# Patient Record
Sex: Female | Born: 1980 | Hispanic: Yes | Marital: Single | Smoking: Never smoker
Health system: Southern US, Community
[De-identification: ages and names within clinical notes are randomized; demographics above are authoritative.]

## PROBLEM LIST (undated history)

## (undated) ENCOUNTER — Inpatient Hospital Stay (HOSPITAL_COMMUNITY): Payer: Self-pay

## (undated) DIAGNOSIS — R87619 Unspecified abnormal cytological findings in specimens from cervix uteri: Secondary | ICD-10-CM

## (undated) DIAGNOSIS — R87629 Unspecified abnormal cytological findings in specimens from vagina: Secondary | ICD-10-CM

## (undated) DIAGNOSIS — I10 Essential (primary) hypertension: Secondary | ICD-10-CM

## (undated) DIAGNOSIS — J45909 Unspecified asthma, uncomplicated: Secondary | ICD-10-CM

## (undated) DIAGNOSIS — D649 Anemia, unspecified: Secondary | ICD-10-CM

## (undated) HISTORY — PX: NO PAST SURGERIES: SHX2092

## (undated) HISTORY — DX: Unspecified asthma, uncomplicated: J45.909

## (undated) HISTORY — DX: Unspecified abnormal cytological findings in specimens from vagina: R87.629

---
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
## 2004-09-30 ENCOUNTER — Encounter (INDEPENDENT_AMBULATORY_CARE_PROVIDER_SITE_OTHER): Payer: Self-pay | Admitting: *Deleted

## 2004-09-30 ENCOUNTER — Inpatient Hospital Stay (HOSPITAL_COMMUNITY): Admission: AD | Admit: 2004-09-30 | Discharge: 2004-10-02 | Payer: Self-pay | Admitting: Obstetrics

## 2009-02-01 ENCOUNTER — Ambulatory Visit (HOSPITAL_COMMUNITY): Admission: RE | Admit: 2009-02-01 | Discharge: 2009-02-01 | Payer: Self-pay | Admitting: Obstetrics & Gynecology

## 2009-02-24 ENCOUNTER — Inpatient Hospital Stay (HOSPITAL_COMMUNITY): Admission: AD | Admit: 2009-02-24 | Discharge: 2009-02-24 | Payer: Self-pay | Admitting: Obstetrics & Gynecology

## 2009-06-22 ENCOUNTER — Ambulatory Visit (HOSPITAL_COMMUNITY): Admission: RE | Admit: 2009-06-22 | Discharge: 2009-06-22 | Payer: Self-pay | Admitting: Obstetrics & Gynecology

## 2009-06-24 ENCOUNTER — Inpatient Hospital Stay (HOSPITAL_COMMUNITY): Admission: AD | Admit: 2009-06-24 | Discharge: 2009-06-25 | Payer: Self-pay | Admitting: Obstetrics & Gynecology

## 2009-06-24 ENCOUNTER — Ambulatory Visit: Payer: Self-pay | Admitting: Physician Assistant

## 2010-07-01 LAB — CBC
HCT: 37.6 % (ref 36.0–46.0)
Hemoglobin: 12.2 g/dL (ref 12.0–15.0)
MCHC: 32.4 g/dL (ref 30.0–36.0)
MCV: 83.6 fL (ref 78.0–100.0)
Platelets: 138 10*3/uL — ABNORMAL LOW (ref 150–400)
RBC: 4.49 MIL/uL (ref 3.87–5.11)
RDW: 15.2 % (ref 11.5–15.5)
WBC: 10 10*3/uL (ref 4.0–10.5)

## 2010-07-01 LAB — RPR: RPR Ser Ql: NONREACTIVE

## 2010-07-11 LAB — CBC
Hemoglobin: 11.6 g/dL — ABNORMAL LOW (ref 12.0–15.0)
MCHC: 32.9 g/dL (ref 30.0–36.0)
MCV: 87.4 fL (ref 78.0–100.0)
RBC: 4.02 MIL/uL (ref 3.87–5.11)
RDW: 14.1 % (ref 11.5–15.5)

## 2012-03-03 ENCOUNTER — Ambulatory Visit (INDEPENDENT_AMBULATORY_CARE_PROVIDER_SITE_OTHER): Payer: Self-pay | Admitting: Gynecology

## 2012-03-03 ENCOUNTER — Encounter: Payer: Self-pay | Admitting: Gynecology

## 2012-03-03 ENCOUNTER — Other Ambulatory Visit (HOSPITAL_COMMUNITY)
Admission: RE | Admit: 2012-03-03 | Discharge: 2012-03-03 | Disposition: A | Discharge status: Home / Self Care | Payer: Self-pay | Source: Ambulatory Visit | Attending: Gynecology | Admitting: Gynecology

## 2012-03-03 VITALS — BP 138/90 | Ht <= 58 in | Wt 125.0 lb

## 2012-03-03 DIAGNOSIS — N938 Other specified abnormal uterine and vaginal bleeding: Secondary | ICD-10-CM | POA: Insufficient documentation

## 2012-03-03 DIAGNOSIS — Z01419 Encounter for gynecological examination (general) (routine) without abnormal findings: Secondary | ICD-10-CM | POA: Insufficient documentation

## 2012-03-03 DIAGNOSIS — N949 Unspecified condition associated with female genital organs and menstrual cycle: Secondary | ICD-10-CM

## 2012-03-03 DIAGNOSIS — Z1151 Encounter for screening for human papillomavirus (HPV): Secondary | ICD-10-CM | POA: Insufficient documentation

## 2012-03-03 DIAGNOSIS — N915 Oligomenorrhea, unspecified: Secondary | ICD-10-CM | POA: Insufficient documentation

## 2012-03-03 DIAGNOSIS — R635 Abnormal weight gain: Secondary | ICD-10-CM

## 2012-03-03 MED ORDER — MEDROXYPROGESTERONE ACETATE 10 MG PO TABS: ORAL_TABLET | ORAL | Status: DC

## 2012-03-03 NOTE — Progress Notes (Signed)
Gabrielle Park [birth year] [id number]   History:    31 y.o.  for annual gyn exam patient to the practice. Patient stated for the past 3 months she has had irregular cycles whereby she would go a couple months without having menses and then bleed for 7-10 days. She had a ParaGard T380A IUD [facility] approximately 2 years ago. She's complained of some weight gain and occasional headaches. She stated her Pap smear was normal in 2012. She in frequently does her breast examination. Her last menstrual period was November 8. She is contemplating on getting pregnant next few months if her exam and lab work were normal. She denies any prior history of abnormal Pap smear.  Past medical history,surgical history, family history and social history were all reviewed and documented in the EPIC chart.  Gynecologic History Patient's last menstrual period was 02/14/2012. Contraception: IUD Last Pap: 2012. Results were: normal Last mammogram: Not indicated. Results were: Not indicated  Obstetric History OB History    Grav Para Term Preterm Abortions TAB SAB Ect Mult Living   3 3 3       3      # Outc Date GA Lbr Len/2nd Wgt Sex Del Anes PTL Lv   1 TRM     F SVD  No Yes   2 TRM     F SVD  No Yes   3 TRM     M SVD  No Yes       ROS: A ROS was performed and pertinent positives and negatives are included in the history.  GENERAL: No fevers or chills. HEENT: No change in vision, no earache, sore throat or sinus congestion. NECK: No pain or stiffness. CARDIOVASCULAR: No chest pain or pressure. No palpitations. PULMONARY: No shortness of breath, cough or wheeze. GASTROINTESTINAL: No abdominal pain, nausea, vomiting or diarrhea, melena or bright red blood per rectum. GENITOURINARY: No urinary frequency, urgency, hesitancy or dysuria. MUSCULOSKELETAL: No joint or muscle pain, no back pain, no recent trauma. DERMATOLOGIC: No rash, no itching, no lesions. ENDOCRINE: No polyuria, polydipsia, no  heat or cold intolerance. No recent change in weight. HEMATOLOGICAL: No anemia or easy bruising or bleeding. NEUROLOGIC: No headache, seizures, numbness, tingling or weakness. PSYCHIATRIC: No depression, no loss of interest in normal activity or change in sleep pattern.     Exam: chaperone present  BP 138/90  Ht 4\' 10"  (1.473 m)  Wt 125 lb (56.7 kg)  BMI 26.13 kg/m2  LMP 02/14/2012  Body mass index is 26.13 kg/(m^2).  General appearance : Well developed well nourished female. No acute distress HEENT: Neck supple, trachea midline, no carotid bruits, no thyroidmegaly Lungs: Clear to auscultation, no rhonchi or wheezes, or rib retractions  Heart: Regular rate and rhythm, no murmurs or gallops Breast:Examined in sitting and supine position were symmetrical in appearance, no palpable masses or tenderness,  no skin retraction, no nipple inversion, no nipple discharge, no skin discoloration, no axillary or supraclavicular lymphadenopathy Abdomen: no palpable masses or tenderness, no rebound or guarding Extremities: no edema or skin discoloration or tenderness  Pelvic:  Bartholin, Urethra, Skene Glands: Within normal limits             Vagina: No gross lesions or discharge  Cervix: No gross lesions or discharge, IUD string seen  Uterus  anteverted, normal size, shape and consistency, non-tender and mobile  Adnexa  Without masses or tenderness  Anus and perineum  normal   Rectovaginal  normal sphincter tone without  palpated masses or tenderness             Hemoccult not indicated     Assessment/Plan:  31 y.o. female for annual exam with secondary amenorrhea. Patient with ParaGard T380A IUD placed 2 years ago. We will obtain the following lab tests: TSH, prolactin, blood sugar, CBC urinalysis and Pap smear. She will be given a prescription of Provera 10 mg to take for 5-10 days if she does not have spontaneous menses every 30 days. She states that she may return to the office in a couple  months to have the IUD removed and since she is contemplating on getting pregnant. We discussed importance of monthly self breast examination. If she continues to have irregular bleeding despite the above recommendation we'll need to do an ultrasound. All the above were discussed in Spanish all questions answered.    Ok Edwards MD, 12:49 PM 03/03/2012

## 2012-03-03 NOTE — Patient Instructions (Signed)
Ejercicios para perder peso (Exercise to Lose Weight) La actividad fsica y una dieta saludable ayudan a perder peso. El mdico podr sugerirle ejercicios especficos. IDEAS Y CONSEJOS PARA HACER EJERCICIOS  Elija opciones econmicas que disfrute hacer , como caminar, andar en bicicleta o los vdeos para ejercitarse.   Utilice las escaleras en lugar del ascensor.   Camine durante la hora del almuerzo.   Estacione el auto lejos del lugar de trabajo o estudio.   Concurra a un gimnasio o tome clases de gimnasia.   Comience con 5  10 minutos de actividad fsica por da. Ejercite hasta 30 minutos, 4 a 6 das por semana.   Utilice zapatos que tengan un buen soporte y ropas cmodas.   Elongue antes y despus de ejercitar.   Ejercite hasta que aumente la respiracin y el corazn palpite rpido.   Beba agua extra cuando ejercite.   No haga ejercicio hasta lastimarse, sentirse mareado o que le falte mucho el aire.  La actividad fsica puede quemar alrededor de 150 caloras.  Correr 20 cuadras en 15 minutos.   Jugar vley durante 45 a 60 minutos.   Limpiar y encerar el auto durante 45 a 60 minutos.   Jugar ftbol americano de toque.   Caminar 25 cuadras en 35 minutos.   Empujar un cochecito 20 cuadras en 30 minutos.   Jugar baloncesto durante 30 minutos.   Rastrillar hojas secas durante 30 minutos.   Andar en bicicleta 80 cuadras en 30 minutos.   Caminar 30 cuadras en 30 minutos.   Bailar durante 30 minutos.   Quitar la nieve con una pala durante 15 minutos.   Nadar vigorosamente durante 20 minutos.   Subir escaleras durante 15 minutos.   Andar en bicicleta 60 cuadras durante 15 minutos.   Arreglar el jardn entre 30 y 45 minutos.   Saltar a la soga durante 15 minutos.   Limpiar vidrios o pisos durante 45 a 60 minutos.  Document Released: 06/29/2010 Document Revised: 12/05/2010 ExitCare Patient Information 2012 [employer]. 

## 2012-03-04 LAB — URINALYSIS W MICROSCOPIC + REFLEX CULTURE
Bilirubin Urine: NEGATIVE
Casts: NONE SEEN
Crystals: NONE SEEN
Glucose, UA: NEGATIVE mg/dL
Ketones, ur: NEGATIVE mg/dL
Specific Gravity, Urine: 1.02 (ref 1.005–1.030)
Squamous Epithelial / LPF: NONE SEEN
pH: 7.5 (ref 5.0–8.0)

## 2014-02-07 ENCOUNTER — Encounter: Payer: Self-pay | Admitting: Gynecology

## 2014-02-15 ENCOUNTER — Encounter (HOSPITAL_COMMUNITY): Payer: Self-pay

## 2014-02-15 ENCOUNTER — Emergency Department (HOSPITAL_COMMUNITY)
Admission: EM | Admit: 2014-02-15 | Discharge: 2014-02-16 | Disposition: A | Discharge status: Home / Self Care | Payer: Self-pay | Attending: Emergency Medicine | Admitting: Emergency Medicine

## 2014-02-15 ENCOUNTER — Emergency Department (HOSPITAL_COMMUNITY): Payer: Self-pay

## 2014-02-15 DIAGNOSIS — R079 Chest pain, unspecified: Secondary | ICD-10-CM

## 2014-02-15 DIAGNOSIS — R531 Weakness: Secondary | ICD-10-CM | POA: Insufficient documentation

## 2014-02-15 DIAGNOSIS — R1084 Generalized abdominal pain: Secondary | ICD-10-CM | POA: Insufficient documentation

## 2014-02-15 DIAGNOSIS — Z79899 Other long term (current) drug therapy: Secondary | ICD-10-CM | POA: Insufficient documentation

## 2014-02-15 DIAGNOSIS — R0789 Other chest pain: Secondary | ICD-10-CM | POA: Insufficient documentation

## 2014-02-15 DIAGNOSIS — R55 Syncope and collapse: Secondary | ICD-10-CM | POA: Insufficient documentation

## 2014-02-15 DIAGNOSIS — Z3202 Encounter for pregnancy test, result negative: Secondary | ICD-10-CM | POA: Insufficient documentation

## 2014-02-15 LAB — CBC
HEMATOCRIT: 34.1 % — AB (ref 36.0–46.0)
Hemoglobin: 10.7 g/dL — ABNORMAL LOW (ref 12.0–15.0)
MCH: 22.8 pg — ABNORMAL LOW (ref 26.0–34.0)
MCHC: 31.4 g/dL (ref 30.0–36.0)
MCV: 72.6 fL — AB (ref 78.0–100.0)
Platelets: 231 10*3/uL (ref 150–400)
RBC: 4.7 MIL/uL (ref 3.87–5.11)
RDW: 18.1 % — ABNORMAL HIGH (ref 11.5–15.5)
WBC: 8.6 10*3/uL (ref 4.0–10.5)

## 2014-02-15 LAB — I-STAT TROPONIN, ED: Troponin i, poc: 0 ng/mL (ref 0.00–0.08)

## 2014-02-15 LAB — URINALYSIS, ROUTINE W REFLEX MICROSCOPIC
Bilirubin Urine: NEGATIVE
Glucose, UA: NEGATIVE mg/dL
Hgb urine dipstick: NEGATIVE
KETONES UR: NEGATIVE mg/dL
LEUKOCYTES UA: NEGATIVE
NITRITE: NEGATIVE
PH: 5 (ref 5.0–8.0)
Protein, ur: NEGATIVE mg/dL
SPECIFIC GRAVITY, URINE: 1.021 (ref 1.005–1.030)
UROBILINOGEN UA: 0.2 mg/dL (ref 0.0–1.0)

## 2014-02-15 LAB — COMPREHENSIVE METABOLIC PANEL
ALBUMIN: 3.8 g/dL (ref 3.5–5.2)
ALT: 13 U/L (ref 0–35)
ANION GAP: 14 (ref 5–15)
AST: 19 U/L (ref 0–37)
Alkaline Phosphatase: 78 U/L (ref 39–117)
BUN: 11 mg/dL (ref 6–23)
CHLORIDE: 102 meq/L (ref 96–112)
CO2: 25 mEq/L (ref 19–32)
CREATININE: 0.67 mg/dL (ref 0.50–1.10)
Calcium: 9.4 mg/dL (ref 8.4–10.5)
GFR calc Af Amer: >90 mL/min (ref >90)
GFR calc non Af Amer: >90 mL/min (ref >90)
Glucose, Bld: 85 mg/dL (ref 70–99)
Potassium: 4.2 mEq/L (ref 3.7–5.3)
Sodium: 141 mEq/L (ref 137–147)
TOTAL PROTEIN: 7.6 g/dL (ref 6.0–8.3)

## 2014-02-15 LAB — POC URINE PREG, ED: PREG TEST UR: NEGATIVE

## 2014-02-15 LAB — D-DIMER, QUANTITATIVE (NOT AT ARMC): D DIMER QUANT: 0.42 ug{FEU}/mL (ref 0.00–0.48)

## 2014-02-15 NOTE — ED Notes (Signed)
Pt ambulated to bathroom. Steady gait

## 2014-02-15 NOTE — ED Notes (Signed)
Pt has returned from radiology.  

## 2014-02-15 NOTE — ED Provider Notes (Signed)
CSN: [id number]     Arrival date & time 02/15/14  1621 History   First MD Initiated Contact with Patient 02/15/14 2155     Chief Complaint  Patient presents with  . Chest Pain  . Loss of Consciousness  . Weakness  . Dysuria     (Consider location/radiation/quality/duration/timing/severity/associated sxs/prior Treatment) HPI Comments: Patient presents with complaint of chest pain that began yesterday and also pain in her lower abdomen. Pain is worse in both areas with deep breathing. Patient denies fever, cough, shortness of breath, nausea, vomiting, diarrhea, dysuria, hematuria, vaginal bleeding or discharge. Her last menstrual period was one month ago and was normal for her at that time. Patient denies risk factors for pulmonary embolism including: unilateral leg swelling, history of DVT/PE/other blood clots, use of estrogens, recent immobilizations, recent surgery, recent travel (>4hr segment), malignancy, hemoptysis.   Patient mentions to triage nurse that she had a syncopal episode prior to arrival. She did not mention this to me while I was talking to her using the interpreter phone.    Patient is a 33 y.o. female presenting with chest pain, syncope, weakness, and dysuria. The history is provided by the patient and medical records. The history is limited by a language barrier. A language interpreter was used.  Chest Pain Associated symptoms: abdominal pain, syncope and weakness   Associated symptoms: no cough, no fever, no headache, no nausea and not vomiting   Loss of Consciousness Associated symptoms: chest pain and weakness   Associated symptoms: no fever, no headaches, no nausea and no vomiting   Weakness Associated symptoms include abdominal pain, chest pain and weakness. Pertinent negatives include no coughing, fever, headaches, myalgias, nausea, rash, sore throat or vomiting.  Dysuria Associated symptoms: abdominal pain   Associated symptoms: no fever, no nausea and no  vomiting     History reviewed. No pertinent past medical history. History reviewed. No pertinent past surgical history. Family History  Problem Relation Age of Onset  . Diabetes Maternal Aunt    History  Substance Use Topics  . Smoking status: Never Smoker   . Smokeless tobacco: Never Used  . Alcohol Use: Yes     Comment: socially   OB History    Gravida Para Term Preterm AB TAB SAB Ectopic Multiple Living   3 3 3       3      Review of Systems  Constitutional: Negative for fever.  HENT: Negative for rhinorrhea and sore throat.   Eyes: Negative for redness.  Respiratory: Negative for cough.   Cardiovascular: Positive for chest pain and syncope.  Gastrointestinal: Positive for abdominal pain. Negative for nausea, vomiting and diarrhea.  Genitourinary: Negative for dysuria.  Musculoskeletal: Negative for myalgias.  Skin: Negative for rash.  Neurological: Positive for syncope and weakness. Negative for headaches.   Allergies  Review of patient's allergies indicates no known allergies.  Home Medications   Prior to Admission medications   Medication Sig Start Date End Date Taking? Authorizing Provider  medroxyPROGESTERone (PROVERA) 10 MG tablet Take one tablet daily for 5-10 days every 30 days if no spontaneous menses 03/03/12   Ok Edwards, MD   BP 155/93 mmHg  Pulse 71  Temp(Src) 98 F (36.7 C) (Oral)  Resp 18  SpO2 100%  LMP 01/30/2014   Physical Exam  Constitutional: She appears well-developed and well-nourished.  HENT:  Head: Normocephalic and atraumatic.  Mouth/Throat: Mucous membranes are normal. Mucous membranes are not dry.  Eyes: Conjunctivae are normal. Right eye  exhibits no discharge. Left eye exhibits no discharge.  Neck: Trachea normal and normal range of motion. Neck supple. Normal carotid pulses and no JVD present. No muscular tenderness present. Carotid bruit is not present. No tracheal deviation present.  Cardiovascular: Normal rate, regular  rhythm, S1 normal, S2 normal, normal heart sounds and intact distal pulses.  Exam reveals no decreased pulses.   No murmur heard. Pulmonary/Chest: Effort normal and breath sounds normal. No respiratory distress. She has no wheezes. She exhibits no tenderness.  Abdominal: Soft. Normal aorta and bowel sounds are normal. There is tenderness (mild) in the suprapubic area and left lower quadrant. There is no rebound and no guarding.  Musculoskeletal: Normal range of motion.  Neurological: She is alert.  Skin: Skin is warm and dry. She is not diaphoretic. No cyanosis. No pallor.  Psychiatric: She has a normal mood and affect.  Nursing note and vitals reviewed.   ED Course  Procedures (including critical care time) Labs Review Labs Reviewed  CBC - Abnormal; Notable for the following:    Hemoglobin 10.7 (*)    HCT 34.1 (*)    MCV 72.6 (*)    MCH 22.8 (*)    RDW 18.1 (*)    All other components within normal limits  COMPREHENSIVE METABOLIC PANEL - Abnormal; Notable for the following:    Total Bilirubin <0.2 (*)    All other components within normal limits  URINALYSIS, ROUTINE W REFLEX MICROSCOPIC  D-DIMER, QUANTITATIVE  I-STAT TROPOININ, ED  POC URINE PREG, ED    Imaging Review Dg Chest 2 View  02/15/2014   CLINICAL DATA:  Chest pain  EXAM: CHEST  2 VIEW  COMPARISON:  None.  FINDINGS: The heart size and mediastinal contours are within normal limits. Both lungs are clear. The visualized skeletal structures are unremarkable.  IMPRESSION: No active cardiopulmonary disease.   Electronically Signed   By: Natasha MeadLiviu  Pop M.D.   On: 02/15/2014 22:56     EKG Interpretation   Date/Time:  Tuesday February 15 2014 23:06:33 EST Ventricular Rate:  54 PR Interval:  167 QRS Duration: 96 QT Interval:  449 QTC Calculation: 425 R Axis:   80 Text Interpretation:  Sinus rhythm RSR' in V1 or V2, probably normal  variant Confirmed by WARD,  DO, KRISTEN ([id number]) on 02/15/2014 11:09:35 PM       10:18 PM  Patient seen and examined. Work-up initiated. Discussed with Dr. Elesa MassedWard. Given syncope -- she warrants d-dimer even though she is otherwise PERC neg. Low suspicion for PE.   Vital signs reviewed and are as follows: BP 155/93 mmHg  Pulse 71  Temp(Src) 98 F (36.7 C) (Oral)  Resp 18  SpO2 100%  LMP 01/30/2014  12:50 AM Patient's workup is negative. Patient informed via interpreter phone. She is anxious to leave. Will start on NSAIDs.   Patient discussed with Dr. Elesa MassedWard prior to discharge.   The patient was urged to return to the Emergency Department immediately with worsening of current symptoms, worsening abdominal pain, persistent vomiting, blood noted in stools, fever, or any other concerns. The patient verbalized understanding.    MDM   Final diagnoses:  Chest pain  Generalized abdominal pain   Patient with CP and mild lower abd pain. Regarding CP, CXR nml, EKG nml, d-dimer neg. Regarding abd pain, labs reassuring, UA clear. She is not having vaginal bleeding or discharge. Do not suspect torsion, TOA/PID. Doubt ruptured ovarian cyst. Do not feel pelvic exam compulsory at this point.   Syncope:  d-dimer neg, UPT neg, no orthostasis, no EKG findings concerning for arrhythmogenic etiology.   Will treat with NSAIDs, have patient f/u with PCP, return with worsening symptoms.   No dangerous or life-threatening conditions suspected or identified by history, physical exam, and by work-up. No indications for hospitalization identified.      Renne CriglerJoshua Sully Manzi, PA-C 02/16/14 [id number]  Ethelda ChickMartha K Linker, MD 02/19/14 [phone]

## 2014-02-15 NOTE — ED Notes (Signed)
Called time x 1 no answer.

## 2014-02-15 NOTE — ED Notes (Signed)
Pt sts she was sitting in the corner with her pager when called for room.  Discharge undone.

## 2014-02-15 NOTE — ED Notes (Signed)
Called no answer

## 2014-02-15 NOTE — ED Notes (Signed)
Pt from home with c/o chest pain, syncope and weakness s/p syncope.  Pt sts she was walking around and had a syncopal episode lasting 15 minutes.  Pt reporting left lower abdominal pain described as "burning".  Also reporting pain with inspiration. Sts she has been feeling weak after syncopal episode.

## 2014-02-15 NOTE — ED Notes (Signed)
Pt monitored by pulse ox, bp cuff, adn 12-lead.

## 2014-02-16 MED ORDER — NAPROXEN 500 MG PO TABS: 500.0000 mg | ORAL_TABLET | Freq: Two times a day (BID) | ORAL | Status: DC

## 2014-02-16 NOTE — Discharge Instructions (Signed)
Please read and follow all provided instructions.  Your diagnoses today include:  1. Generalized abdominal pain   2. Chest pain     Tests performed today include:  Blood counts and electrolytes - normal  Blood tests to check liver and kidney function - normal  Blood tests to check pancreas function - normal  Urine test to look for infection and pregnancy (in women) - no infection or pregnancy  Chest x-ray - no infection or other problems.   Vital signs. See below for your results today.   Medications prescribed:   Naproxen - anti-inflammatory pain medication  Do not exceed 500mg  naproxen every 12 hours, take with food  You have been prescribed an anti-inflammatory medication or NSAID. Take with food. Take smallest effective dose for the shortest duration needed for your pain. Stop taking if you experience stomach pain or vomiting.   Take any prescribed medications only as directed.  Home care instructions:   Follow any educational materials contained in this packet.  Follow-up instructions: Please follow-up with your primary care provider in the next 3 days for further evaluation of your symptoms.    Return instructions:  SEEK IMMEDIATE MEDICAL ATTENTION IF:  The pain does not go away or becomes severe   A temperature above 101F develops   Repeated vomiting occurs (multiple episodes)   The pain becomes localized to portions of the abdomen. The right side could possibly be appendicitis. In an adult, the left lower portion of the abdomen could be colitis or diverticulitis.   Blood is being passed in stools or vomit (bright red or black tarry stools)   You develop chest pain, difficulty breathing, dizziness or fainting, or become confused, poorly responsive, or inconsolable (young children)  If you have any other emergent concerns regarding your health  Additional Information: Abdominal (belly) pain can be caused by many things. Your caregiver performed an  examination and possibly ordered blood/urine tests and imaging (CT scan, x-rays, ultrasound). Many cases can be observed and treated at home after initial evaluation in the emergency department. Even though you are being discharged home, abdominal pain can be unpredictable. Therefore, you need a repeated exam if your pain does not resolve, returns, or worsens. Most patients with abdominal pain don't have to be admitted to the hospital or have surgery, but serious problems like appendicitis and gallbladder attacks can start out as nonspecific pain. Many abdominal conditions cannot be diagnosed in one visit, so follow-up evaluations are very important.  Your vital signs today were: BP 144/83 mmHg   Pulse 56   Temp(Src) 98 F (36.7 C) (Oral)   Resp 20   SpO2 100%   LMP 01/30/2014 If your blood pressure (bp) was elevated above 135/85 this visit, please have this repeated by your doctor within one month. --------------

## 2014-08-03 ENCOUNTER — Encounter (HOSPITAL_COMMUNITY): Payer: Self-pay | Admitting: *Deleted

## 2014-08-18 ENCOUNTER — Ambulatory Visit (HOSPITAL_COMMUNITY)
Admission: RE | Admit: 2014-08-18 | Discharge: 2014-08-18 | Disposition: A | Discharge status: Home / Self Care | Payer: Self-pay | Source: Ambulatory Visit | Attending: Obstetrics and Gynecology | Admitting: Obstetrics and Gynecology

## 2014-08-18 ENCOUNTER — Encounter (HOSPITAL_COMMUNITY): Payer: Self-pay

## 2014-08-18 VITALS — BP 110/64 | Temp 97.3°F | Ht 60.0 in | Wt 131.8 lb

## 2014-08-18 DIAGNOSIS — N6489 Other specified disorders of breast: Secondary | ICD-10-CM

## 2014-08-18 DIAGNOSIS — Q838 Other congenital malformations of breast: Secondary | ICD-10-CM

## 2014-08-18 DIAGNOSIS — R87612 Low grade squamous intraepithelial lesion on cytologic smear of cervix (LGSIL): Secondary | ICD-10-CM

## 2014-08-18 HISTORY — DX: Essential (primary) hypertension: I10

## 2014-08-18 NOTE — Patient Instructions (Signed)
Education materials on self-breast awareness given. Explained to Hormel Foodsraceli Park the colposcopy that is needed to follow-up for abnormal Pap smear. [facility] to follow-up for abnormal Pap smear. Appointment scheduled for Monday, Aug 22, 2014 at 1515. Patient aware of appointment and will be there. Screening mammogram recommended at age 34 unless clinically indicated prior. Aamani Park verbalized understanding.  Janelis Stelzer, Kathaleen Maserhristine Poll, RN 4:38 PM

## 2014-08-18 NOTE — Progress Notes (Signed)
[facility] due to recommending a colposocopy to follow-up for abnormal Pap smear 07/22/2014.  Pap Smear:  Pap smear not completed today. [facility] and LGS[facility]. [facility] for a colposcopy to follow-up for abnormal Pap smear. Appointment scheduled for Monday, Aug 22, 2014 at 1515. Per patient her most recent Pap smear is the only abnormal Pap smear she has had. Last Pap smear result is scanned into EPIC under media.  Physical exam: Breasts Breasts symmetrical. No skin abnormalities bilateral breasts. No nipple retraction bilateral breasts. No nipple discharge bilateral breasts. No lymphadenopathy. No lumps palpated bilateral breasts. No complaints of pain or tenderness on exam. Screening mammogram recommended at age 34 unless clinically indicated prior.    Pelvic/Bimanual No Pap smear completed today since last Pap smear was 07/22/2014. Pap smear not indicated per [facility] guidelines.   Used interpreter Nira ConnJulia Sowell.

## 2014-08-22 ENCOUNTER — Encounter: Payer: Self-pay | Admitting: Obstetrics & Gynecology

## 2014-08-22 ENCOUNTER — Ambulatory Visit (INDEPENDENT_AMBULATORY_CARE_PROVIDER_SITE_OTHER): Payer: Self-pay | Admitting: Obstetrics & Gynecology

## 2014-08-22 ENCOUNTER — Other Ambulatory Visit (HOSPITAL_COMMUNITY)
Admission: RE | Admit: 2014-08-22 | Discharge: 2014-08-22 | Disposition: A | Discharge status: Home / Self Care | Payer: Self-pay | Source: Ambulatory Visit | Attending: Obstetrics & Gynecology | Admitting: Obstetrics & Gynecology

## 2014-08-22 VITALS — BP 149/90 | HR 51 | Ht 58.5 in | Wt 128.6 lb

## 2014-08-22 DIAGNOSIS — R87612 Low grade squamous intraepithelial lesion on cytologic smear of cervix (LGSIL): Secondary | ICD-10-CM

## 2014-08-22 LAB — POCT PREGNANCY, URINE: PREG TEST UR: NEGATIVE

## 2014-08-22 NOTE — Progress Notes (Signed)
Patient ID: Gabrielle Park, female   DOB: 10/26/1980, 34 y.o.   MRN: [id number]  Chief Complaint  Patient presents with  . Colposcopy    HPI Gabrielle Park is a 34 y.o. female.  Patient's last menstrual period was 07/30/2014 (exact date). 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 Previous colposcopy: no and in n/a. Prior cervical treatment: no.  Past Medical History  Diagnosis Date  . Hypertension   . Vaginal Pap smear, abnormal     History reviewed. No pertinent past surgical history.  Family History  Problem Relation Age of Onset  . Diabetes Maternal Aunt   . Hypertension Mother     Social History History  Substance Use Topics  . Smoking status: Never Smoker   . Smokeless tobacco: Never Used  . Alcohol Use: No     Comment: socially    No Known Allergies  Current Outpatient Prescriptions  Medication Sig Dispense Refill  . medroxyPROGESTERone (PROVERA) 10 MG tablet Take one tablet daily for 5-10 days every 30 days if no spontaneous menses 30 tablet 4  . naproxen (NAPROSYN) 500 MG tablet Take 1 tablet (500 mg total) by mouth 2 (two) times daily. 30 tablet 0   No current facility-administered medications for this visit.    Review of Systems Review of Systems  Blood pressure 149/90, pulse 51, height 4' 10.5" (1.486 m), weight 128 lb 9.6 oz (58.333 kg), last menstrual period 07/30/2014.  Physical Exam Physical Exam  Data Reviewed Pap LSIL  Assessment    Procedure Details  The risks and benefits of the procedure and Written informed consent obtained. SCJ, TZ seen, minimal AWE, smal islands of WE, no abnl vessels Speculum placed in vagina and excellent visualization of cervix achieved, cervix swabbed x 3 with acetic acid solution. Monsels Specimens: ECC and Bx 12 and 6   Complications: none.     Plan    Specimens labelled and  sent to Pathology. Return to discuss Pathology results in 2 weeks.      Gabrielle Park 08/22/2014, 3:50 PM

## 2014-08-22 NOTE — Patient Instructions (Signed)
Colposcopa - Cuidados posteriores (Colposcopy, Care After) Siga estas instrucciones durante las prximas semanas. Estas indicaciones le proporcionan informacin general acerca de cmo deber cuidarse despus del procedimiento. El mdico tambin podr darle instrucciones ms especficas. El tratamiento se ha planificado de acuerdo a las prcticas mdicas actuales, pero a veces se producen problemas. Comunquese con el mdico si tiene algn problema o tiene dudas despus del procedimiento. QU ESPERAR DESPUS DEL PROCEDIMIENTO  Despus del procedimiento, es tpico tener las siguientes sensaciones:  Clicos. Generalmente se calman en algunos minutos.  Dolor. Puede durar hasta dos das.  Aturdimiento. Si esto le ocurre, recustese durante algunos minutos. Podr tener un sangrado leve o una secrecin oscura que debe detenerse en algunos das. Durante este tiempo deber usar un apsito sanitario. INSTRUCCIONES PARA EL CUIDADO EN EL HOGAR  Evite las relaciones sexuales, las duchas vaginales y el uso de tampones durante 3 das, o segn lo que le indique su mdico.  Tome slo medicamentos de venta libre o recetados, segn las indicaciones del mdico. No tome aspirina, ya que puede causar hemorragias.  Si utiliza pldoras anticonceptivas, contine tomndolas.  No todos los resultados estarn disponibles durante su visita. En este caso, tenga otra entrevista con su mdico para conocerlos. No suponga que es normal si no tiene noticias de su mdico o del establecimiento de salud. Es importante el seguimiento de todos los resultados de los estudios.  Siga los consejos de su mdico con respecto a los medicamentos, actividades, visitas y [place] de control. SOLICITE ATENCIN MDICA SI:  Aparece una erupcin cutnea.  Tiene problemas con los medicamentos. SOLICITE ATENCIN MDICA DE INMEDIATO SI:  Tiene una hemorragia abundante o elimina cogulos.  Tiene fiebre.  Tiene flujo vaginal  anormal.  Tiene clicos que no se alivian luego de tomar analgsicos.  Se siente mareada, tiene vahdos o se desmaya.  Siente dolor en el estmago. Document Released: 01/13/2013 ExitCare Patient Information 2015 [employer]. This information is not intended to replace advice given to you by your health care provider. Make sure you discuss any questions you have with your health care provider.  

## 2014-08-24 ENCOUNTER — Telehealth: Payer: Self-pay

## 2014-08-24 NOTE — Telephone Encounter (Signed)
Attempted to contact patient with spanish interpreter Gabrielle Park. No answer. Left message stating we are calling with results, please call clinic.

## 2014-08-24 NOTE — Telephone Encounter (Signed)
-----   Message from Adam PhenixJames G Arnold, MD sent at 08/24/2014  3:23 PM EDT ----- LSIL biopsy, needs f/u pap and cotesting 1 yr, may notify patient by phone with interpreter

## 2014-08-25 NOTE — Telephone Encounter (Signed)
Called pt with Spanish interpreter, Viviana SimplerAlis Park, and informed pt that she just needs to f/u in one year with pap with co-testing after having biopsy.  Pt stated understanding with no further questions.

## 2014-09-09 ENCOUNTER — Ambulatory Visit (INDEPENDENT_AMBULATORY_CARE_PROVIDER_SITE_OTHER): Payer: Self-pay | Admitting: Obstetrics & Gynecology

## 2014-09-09 ENCOUNTER — Encounter: Payer: Self-pay | Admitting: Obstetrics & Gynecology

## 2014-09-09 VITALS — BP 129/90 | HR 59 | Temp 98.2°F | Wt 125.5 lb

## 2014-09-09 DIAGNOSIS — R87612 Low grade squamous intraepithelial lesion on cytologic smear of cervix (LGSIL): Secondary | ICD-10-CM | POA: Insufficient documentation

## 2014-09-09 NOTE — Patient Instructions (Signed)
Informe de Papanicolau anormal  (Abnormal Pap Test Information) En el Papanicolaou se estudian las clulas de la superficie del cuello del tero para observar si son normales, anormales o si muestran signos de Building services engineeralteraciones debido a un tipo de virus llamado virus del Geneticist, molecularpapiloma humano o VPH. Se llama displasia a las clulas cervicales afectadas por el VPH. La displasia no es cncer, pero indica que hay clulas anormales que se encuentran en la superficie del cuello uterino. Segn el grado de displasia, algunas de las clulas puede ser consideradas pre-cancerosas y pueden convertirse en cncer con el tiempo, si se retrasa la atencin mdica.  QU SIGNIFICA QUE UN PAPANICOAU ES ANORMAL?  Tener una prueba de Papanicolaou anormal no significa que tiene cncer. Sin embargo, ciertos tipos de Papanicolaou anormales pueden ser signo de un riesgo ms alto de Geophysical data processordesarrollar cncer. El mdico le ordenar otras pruebas para investigar ms sobre las clulas anormales. Un resultado anormal de la prueba de Papanicolaou puede mostrar:   Cambios pequeos e inespecficos que deben controlarse cuidadosamente.   Displasia cervical que ha provocado cambios leves y que puede controlarse a travs del Reservetiempo.   Displasia cervical ms grave que debe seguirse y tratarse para estar seguros de que el problema desaparece.  Cncer.  Cuando se detecta una displasia cervical grave y se trata a tiempo, rara vez llega a ser cncer.  QU DEBO HACER LUEGO DE UN PAPANICOLAU ANORMAL?   Puede ser necesario realizar una colposcopia. Este es un procedimiento en el que se examina el cuello del tero con iluminacin y Set designermagnificacin.  Podrn extraerle Carlynn Heralduna pequea muestra de tejido del cuello uterino (biopsia) para ser [place]analizada. Esto generalmente se realiza si hay reas que parecen infectadas.  La muestra de clulas del canal cervical se toma con un cepillo pequeo o un instrumento de raspado (cureta). Segn los Norfolk Southernresultados de los  procedimientos anteriores, Oregonel mdico podr indicar un tratamiento con crioterapia en el cuello uterino o un LEEP quirrgico, en el que se extirpa una porcin del cuello uterino. LEEP son las siglas en ingls de "procedimiento de escisin electroquirrgica con asa". En raras ocasiones, el mdico podr indicar una biopsia en cono. Este es un procedimiento en el que se extrae una pequea muestra en forma de cono del cuello del tero. Se extrae en el rea en la que se encuentran las clulas.   QU DEBO HACER SI TENGO DISPLASIA O CNCER?  La derivarn a Music therapistun especialista. La radioterapia es un tratamiento para el cncer en los casos ms avanzados. La histerectoma es la ltima opcin de tratamiento para la displasia, pero es un tratamiento ms frecuente para Management consultantel cncer. El mdico comentar con usted todas las opciones de Hagarvilletratamiento.  QU DEBE HACER DESPUS DEL TRATAMIENTO?  Si su Papanicolaou no es normal, debe continuar con las pruebas y controles habituales segn las indicaciones de su mdico. Su problema cervical se vigilar cuidadosamente para que no empeore. Adems, el mdico controlar y tratar los nuevos problemas que puedan surgir.  Document Released: 06/17/2011 Document Revised: 07/20/2012 [facility] Patient Information 2015 [place], [place]. This information is not intended to replace advice given to you by your health care provider. Make sure you discuss any questions you have with your health care provider.

## 2014-09-09 NOTE — Progress Notes (Signed)
Subjective:     Patient ID: Gabrielle Park, female   DOB: 05/08/1980, 34 y.o.   MRN: [id number] Cc: pap and Bx result [id number] Patient's last menstrual period was 07/30/2014 (exact date). Here for result from colposcopy 5/16/ LSIL pap colpo note  Below reviewed  Expand All Collapse All   Patient ID: Gabrielle Park, female DOB: 04/21/1980, 34 y.o. MRN: [id number]  Chief Complaint  Patient presents with  . Colposcopy    HPI Gabrielle Park is a 34 y.o. female. Patient's last menstrual period was 07/30/2014 (exact date). [id number]  HPI  Indications: Pap smear on April 2016 showed: low-grade squamous intraepithelial neoplasia (LGSIL - encompassing HPV,mild dysplasia,CIN I). Previous colposcopy: no and in n/a. Prior cervical treatment: no.  Past Medical History  Diagnosis Date  . Hypertension   . Vaginal Pap smear, abnormal     History reviewed. No pertinent past surgical history.  Family History  Problem Relation Age of Onset  . Diabetes Maternal Aunt   . Hypertension Mother     Social History History  Substance Use Topics  . Smoking status: Never Smoker   . Smokeless tobacco: Never Used  . Alcohol Use: No     Comment: socially    No Known Allergies  Current Outpatient Prescriptions  Medication Sig Dispense Refill  . medroxyPROGESTERone (PROVERA) 10 MG tablet Take one tablet daily for 5-10 days every 30 days if no spontaneous menses 30 tablet 4  . naproxen (NAPROSYN) 500 MG tablet Take 1 tablet (500 mg total) by mouth 2 (two) times daily. 30 tablet 0   No current facility-administered medications for this visit.    Review of Systems Review of Systems  Blood pressure 149/90, pulse 51, height 4' 10.5" (1.486 m), weight 128 lb 9.6 oz (58.333 kg), last menstrual period 07/30/2014.  Physical Exam Physical Exam  Data Reviewed Pap LSIL  Assessment    Procedure Details   The risks and benefits of the procedure and Written informed consent obtained. SCJ, TZ seen, minimal AWE, smal islands of WE, no abnl vessels Speculum placed in vagina and excellent visualization of cervix achieved, cervix swabbed x 3 with acetic acid solution. Monsels Specimens: ECC and Bx 12 and 6   Complications: none.     Plan    Specimens labelled and sent to Pathology. Return to discuss Pathology results in 2 weeks.      Adolphus Hanf 08/22/2014, 3:50 PM      CIN 1 on Bx She asks to review her vital signs as she is on BP meds Rx by [facility]. Her BP med is not recorded and she cannot recall the name   Review of Systems  All other systems reviewed and are negative.      Objective:   Physical Exam  Constitutional: She appears well-developed. No distress.  Psychiatric: She has a normal mood and affect. Her behavior is normal.      Blood pressure 129/90, pulse 59, temperature 98.2 F (36.8 C), weight 125 lb 8 oz (56.926 kg), last menstrual period 07/30/2014.  Assessment:     LSILbx result Borderline BP on med, no change in mgmt     Plan:     F/u pap in 12 mo, may be done at HD    [facility]   Adam PhenixJames G Annick Dimaio, MD 09/09/2014

## 2014-09-09 NOTE — Progress Notes (Signed)
Here for results . Used interpreter Albertina SenegalMarly Adams

## 2014-09-20 ENCOUNTER — Encounter: Payer: Self-pay | Admitting: General Practice

## 2015-04-09 NOTE — L&D Delivery Note (Signed)
35 y.o. G4P3003 at 5167w5d delivered a viable female infant in cephalic, OA position. no nuchal cord. right anterior shoulder delivered with ease. 60 sec delayed cord clamping. Cord clamped x2 and cut. Placenta delivered spontaneously intact, with 3VC. Fundus firm on exam with massage and pitocin. Good hemostasis noted.  Laceration: superficial midline  Suture:  EBL: 100cc Good hemostasis noted.  Mom and baby recovering in LDR.    Apgars: 6,8 Weight: pending, skin to skin, see delivery summary  Cord blood obtained: yes   WALLACE, NOAH I, DO PGY-3 03/05/2016, 5:59 PM   OB FELLOW DELIVERY ATTESTATION  I was gloved and present for the delivery in its entirety, and I agree with the above resident's note.    Ernestina PennaNicholas Schenk, MD 6:08 PM

## 2015-04-24 ENCOUNTER — Other Ambulatory Visit: Payer: Self-pay | Admitting: Internal Medicine

## 2015-04-24 ENCOUNTER — Ambulatory Visit
Admission: RE | Admit: 2015-04-24 | Discharge: 2015-04-24 | Disposition: A | Discharge status: Home / Self Care | Payer: No Typology Code available for payment source | Source: Ambulatory Visit | Attending: Internal Medicine | Admitting: Internal Medicine

## 2015-04-24 DIAGNOSIS — R0602 Shortness of breath: Secondary | ICD-10-CM

## 2015-07-11 ENCOUNTER — Encounter (HOSPITAL_COMMUNITY): Payer: Self-pay | Admitting: *Deleted

## 2015-07-11 ENCOUNTER — Inpatient Hospital Stay (HOSPITAL_COMMUNITY)
Admission: AD | Admit: 2015-07-11 | Discharge: 2015-07-11 | Disposition: A | Discharge status: Home / Self Care | Payer: Self-pay | Source: Ambulatory Visit | Attending: Obstetrics & Gynecology | Admitting: Obstetrics & Gynecology

## 2015-07-11 ENCOUNTER — Inpatient Hospital Stay (HOSPITAL_COMMUNITY): Payer: Self-pay

## 2015-07-11 DIAGNOSIS — Z3A01 Less than 8 weeks gestation of pregnancy: Secondary | ICD-10-CM | POA: Insufficient documentation

## 2015-07-11 DIAGNOSIS — O3680X0 Pregnancy with inconclusive fetal viability, not applicable or unspecified: Secondary | ICD-10-CM

## 2015-07-11 DIAGNOSIS — R102 Pelvic and perineal pain: Secondary | ICD-10-CM | POA: Insufficient documentation

## 2015-07-11 DIAGNOSIS — O26891 Other specified pregnancy related conditions, first trimester: Secondary | ICD-10-CM | POA: Insufficient documentation

## 2015-07-11 DIAGNOSIS — O161 Unspecified maternal hypertension, first trimester: Secondary | ICD-10-CM | POA: Insufficient documentation

## 2015-07-11 LAB — URINALYSIS, ROUTINE W REFLEX MICROSCOPIC
Bilirubin Urine: NEGATIVE
GLUCOSE, UA: NEGATIVE mg/dL
Hgb urine dipstick: NEGATIVE
Ketones, ur: NEGATIVE mg/dL
Leukocytes, UA: NEGATIVE
Nitrite: NEGATIVE
PH: 6 (ref 5.0–8.0)
PROTEIN: NEGATIVE mg/dL
Specific Gravity, Urine: 1.015 (ref 1.005–1.030)

## 2015-07-11 LAB — CBC
HCT: 38 % (ref 36.0–46.0)
Hemoglobin: 12.4 g/dL (ref 12.0–15.0)
MCH: 25.2 pg — AB (ref 26.0–34.0)
MCHC: 32.6 g/dL (ref 30.0–36.0)
MCV: 77.2 fL — AB (ref 78.0–100.0)
PLATELETS: 207 10*3/uL (ref 150–400)
RBC: 4.92 MIL/uL (ref 3.87–5.11)
RDW: 17.6 % — AB (ref 11.5–15.5)
WBC: 9.8 10*3/uL (ref 4.0–10.5)

## 2015-07-11 LAB — WET PREP, GENITAL
Clue Cells Wet Prep HPF POC: NONE SEEN
Sperm: NONE SEEN
Trich, Wet Prep: NONE SEEN
Yeast Wet Prep HPF POC: NONE SEEN

## 2015-07-11 LAB — POCT PREGNANCY, URINE: Preg Test, Ur: POSITIVE — AB

## 2015-07-11 LAB — ABO/RH: ABO/RH(D): O POS

## 2015-07-11 LAB — HCG, QUANTITATIVE, PREGNANCY: hCG, Beta Chain, Quant, S: 1770 m[IU]/mL — ABNORMAL HIGH (ref <5)

## 2015-07-11 MED ORDER — LABETALOL HCL 100 MG PO TABS
200.0000 mg | ORAL_TABLET | Freq: Once | ORAL | Status: AC
  Administered 2015-07-11: 200 mg via ORAL
  Filled 2015-07-11 (×2): qty 2

## 2015-07-11 MED ORDER — LABETALOL HCL 200 MG PO TABS: 200.0000 mg | ORAL_TABLET | Freq: Two times a day (BID) | ORAL | Status: DC

## 2015-07-11 NOTE — Discharge Instructions (Signed)
Dolor abdominal en el embarazo  (Abdominal Pain During Pregnancy)  El dolor abdominal es frecuente durante el embarazo. Generalmente no causa ningún daño. El dolor abdominal puede tener numerosas causas. Algunas causas son más graves que otras. Ciertas causas de dolor abdominal durante el embarazo se diagnostican fácilmente. A veces, se tarda un tiempo para llegar al diagnóstico. Otras veces la causa no se conoce. El dolor abdominal puede estar relacionado con alguna alteración del embarazo, o puede deberse a una causa totalmente diferente. Por este motivo, siempre consulte a su médico cuando sienta molestias abdominales.  INSTRUCCIONES PARA EL CUIDADO EN EL HOGAR   Esté atenta al dolor para ver si hay cambios. Las siguientes indicaciones ayudarán a aliviar cualquier molestia que pueda sentir:  · No tenga relaciones sexuales y no coloque nada dentro de la vagina hasta que los síntomas hayan desaparecido completamente.  · Descanse todo lo que pueda hasta que el dolor se le haya calmado.  · Si siente náuseas, beba líquidos claros. Evite los alimentos sólidos mientras sienta malestar o tenga náuseas.  · Tome sólo medicamentos de venta libre o recetados, según las indicaciones del médico.  · Cumpla con todas las visitas de control, según le indique su médico.  SOLICITE ATENCIÓN MÉDICA DE INMEDIATO SI:  · Tiene un sangrado, pérdida de líquidos o elimina tejidos por la vagina.  · El dolor o los cólicos aumentan.  · Tiene vómitos persistentes.  · Comienza a sentir dolor al orinar u observa sangre.  · Tiene fiebre.  · Nota que los movimientos del bebé disminuyen.  · Siente intensa debilidad o se marea.  · Tiene dificultad para respirar con o sin dolor abdominal.  · Siente un dolor de cabeza intenso junto al dolor abdominal.  · Tiene una secreción vaginal anormal con dolor abdominal.  · Tiene diarrea persistente.  · El dolor abdominal sigue o empeora aún después de hacer reposo.  ASEGÚRESE DE QUE:   · Comprende estas  instrucciones.  · Controlará su afección.  · Recibirá ayuda de inmediato si no mejora o si empeora.     Esta información no tiene como fin reemplazar el consejo del médico. Asegúrese de hacerle al médico cualquier pregunta que tenga.     Document Released: 03/25/2005 Document Revised: 01/13/2013  Elsevier Interactive Patient Education ©2016 Elsevier Inc.

## 2015-07-11 NOTE — MAU Note (Addendum)
Pt had pos HPT yesterday, wants to know how far along she is.  Denies bleeding, has lower abd pain for the past week.  Pt states she has been on BP med, stopped taking it when she found out she was pregnant.

## 2015-07-11 NOTE — MAU Provider Note (Signed)
Chief Complaint: Possible Pregnancy and Abdominal Pain   First Provider Initiated Contact with Patient 07/11/15 1933      SUBJECTIVE HPI: Gabrielle Park is a 35 y.o. G4P3003 at [redacted]w[redacted]d by LMP who presents to maternity admissions reporting abdominal cramping and possible pregnancy.  She reports her LMP was 2/20 and she is sure of this date.  She reports the cramping is mild, intermittent and she has not tried anything for treatment. She was on blood pressure medications but stopped when she thought she might be pregnant.  She denies h/a, epigastric pain, visual disturbances, dizziness, chest pain. She denies vaginal bleeding, vaginal itching/burning, urinary symptoms, n/v, or fever/chills.     HPI  Past Medical History  Diagnosis Date  . Hypertension   . Vaginal Pap smear, abnormal    History reviewed. No pertinent past surgical history. Social History   Social History  . Marital Status: Single    Spouse Name: N/A  . Number of Children: N/A  . Years of Education: N/A   Occupational History  . Not on file.   Social History Main Topics  . Smoking status: Never Smoker   . Smokeless tobacco: Never Used  . Alcohol Use: No     Comment: socially  . Drug Use: No  . Sexual Activity: Yes    Birth Control/ Protection: None     Comment: PARAGARD   Other Topics Concern  . Not on file   Social History Narrative   No current facility-administered medications on file prior to encounter.   No current outpatient prescriptions on file prior to encounter.   No Known Allergies  ROS:  Review of Systems  Constitutional: Negative for fever, chills and fatigue.  Respiratory: Negative for shortness of breath.   Cardiovascular: Negative for chest pain.  Genitourinary: Positive for pelvic pain. Negative for dysuria, flank pain, vaginal bleeding, vaginal discharge, difficulty urinating and vaginal pain.  Neurological: Negative for dizziness and headaches.  Psychiatric/Behavioral:  Negative.      I have reviewed patient's Past Medical Hx, Surgical Hx, Family Hx, Social Hx, medications and allergies.   Physical Exam   Patient Vitals for the past 24 hrs:  BP Temp Temp src Pulse Resp  07/11/15 1915 149/86 mmHg - - 104 -  07/11/15 1900 143/88 mmHg - - 102 -  07/11/15 1845 139/95 mmHg - - 108 -  07/11/15 1837 (!) 156/101 mmHg - - 115 (!) 161  07/11/15 1747 168/99 mmHg 98.8 F (37.1 C) Oral 73 16   Constitutional: Well-developed, well-nourished female in no acute distress.  Cardiovascular: normal rate Respiratory: normal effort GI: Abd soft, non-tender. Pos BS x 4 MS: Extremities nontender, no edema, normal ROM Neurologic: Alert and oriented x 4.  GU: Neg CVAT.  PELVIC EXAM: Cervix pink, visually closed, without lesion, scant white creamy discharge, vaginal walls and external genitalia normal Bimanual exam: Cervix 0/long/high, firm, anterior, neg CMT, uterus nontender, nonenlarged, adnexa without tenderness, enlargement, or mass   LAB RESULTS Results for orders placed or performed during the hospital encounter of 07/11/15 (from the past 24 hour(s))  Urinalysis, Routine w reflex microscopic (not at [facility])     Status: None   Collection Time: 07/11/15  5:55 PM  Result Value Ref Range   Color, Urine YELLOW YELLOW   APPearance CLEAR CLEAR   Specific Gravity, Urine 1.015 1.005 - 1.030   pH 6.0 5.0 - 8.0   Glucose, UA NEGATIVE NEGATIVE mg/dL   Hgb urine dipstick NEGATIVE NEGATIVE   Bilirubin Urine  NEGATIVE NEGATIVE   Ketones, ur NEGATIVE NEGATIVE mg/dL   Protein, ur NEGATIVE NEGATIVE mg/dL   Nitrite NEGATIVE NEGATIVE   Leukocytes, UA NEGATIVE NEGATIVE  Pregnancy, urine POC     Status: Abnormal   Collection Time: 07/11/15  6:12 PM  Result Value Ref Range   Preg Test, Ur POSITIVE (A) NEGATIVE  CBC     Status: Abnormal   Collection Time: 07/11/15  7:30 PM  Result Value Ref Range   WBC 9.8 4.0 - 10.5 K/uL   RBC 4.92 3.87 - 5.11 MIL/uL   Hemoglobin 12.4 12.0  - 15.0 g/dL   HCT 42.538.0 95.636.0 - 38.746.0 %   MCV 77.2 (L) 78.0 - 100.0 fL   MCH 25.2 (L) 26.0 - 34.0 pg   MCHC 32.6 30.0 - 36.0 g/dL   RDW 56.417.6 (H) 33.211.5 - 95.115.5 %   Platelets 207 150 - 400 K/uL  hCG, quantitative, pregnancy     Status: Abnormal   Collection Time: 07/11/15  7:30 PM  Result Value Ref Range   hCG, Beta Chain, Quant, S 1770 (H) <5 mIU/mL  Wet prep, genital     Status: Abnormal   Collection Time: 07/11/15  7:30 PM  Result Value Ref Range   Yeast Wet Prep HPF POC NONE SEEN NONE SEEN   Trich, Wet Prep NONE SEEN NONE SEEN   Clue Cells Wet Prep HPF POC NONE SEEN NONE SEEN   WBC, Wet Prep HPF POC FEW (A) NONE SEEN   Sperm NONE SEEN   ABO/Rh     Status: None   Collection Time: 07/11/15  7:30 PM  Result Value Ref Range   ABO/RH(D) O POS     --/--/O POS (04/04 1930)  IMAGING No results found.  MAU Management/MDM: Ordered labs and reviewed results.  Findings today could represent a normal early pregnancy, spontaneous abortion or ectopic pregnancy which can be life-threatening.  Ectopic precautions were given to the patient with plan to return in 48 hours to [facility] for repeat quant hcg to evaluate pregnancy development. Pt unable to get out of work to come to clinic Thurs or Fri morning so plan to f/u in MAU on Thurs evening. Treatments in MAU included labetalol 200 mg PO.  Rx for Labetalol 200 mg BID.  Pt stable at time of discharge.  ASSESSMENT 1. Pregnancy of unknown anatomic location   2. Pelvic pain affecting pregnancy in first trimester, antepartum   3. Hypertension complicating pregnancy, first trimester     PLAN Discharge home F/U in MAU Thurs 4/6 for quant hcg because of pt work schedule Return to MAU as needed for emergencies    Medication List    STOP taking these medications        hydrochlorothiazide 12.5 MG capsule  Commonly known as:  MICROZIDE      TAKE these medications        labetalol 200 MG tablet  Commonly known as:  NORMODYNE  Take 1 tablet (200  mg total) by mouth 2 (two) times daily.       Follow-up Information    [facility]   Specialty:  Obstetrics and Gynecology   Why:  On Friday 4/7 at 8 am for labs, , Return to MAU as needed for emergencies El viernes 4/7 a las 8 am para laboratorios,, Regresar a MAU segn sea necesario para Automotive engineeremergencias   Contact information:   [address] [place] [phone]  Sharen Counter Certified Nurse-Midwife 07/11/2015  8:42 PM

## 2015-07-12 LAB — HIV ANTIBODY (ROUTINE TESTING W REFLEX): HIV Screen 4th Generation wRfx: NONREACTIVE

## 2015-07-12 LAB — GC/CHLAMYDIA PROBE AMP (~~LOC~~) NOT AT ARMC
Chlamydia: NEGATIVE
Neisseria Gonorrhea: NEGATIVE

## 2015-07-13 ENCOUNTER — Inpatient Hospital Stay (HOSPITAL_COMMUNITY)
Admission: AD | Admit: 2015-07-13 | Discharge: 2015-07-13 | Disposition: A | Discharge status: Home / Self Care | Payer: No Typology Code available for payment source | Source: Ambulatory Visit | Attending: Obstetrics & Gynecology | Admitting: Obstetrics & Gynecology

## 2015-07-13 DIAGNOSIS — O3680X0 Pregnancy with inconclusive fetal viability, not applicable or unspecified: Secondary | ICD-10-CM

## 2015-07-13 DIAGNOSIS — O9989 Other specified diseases and conditions complicating pregnancy, childbirth and the puerperium: Secondary | ICD-10-CM

## 2015-07-13 DIAGNOSIS — R109 Unspecified abdominal pain: Secondary | ICD-10-CM

## 2015-07-13 DIAGNOSIS — Z3A01 Less than 8 weeks gestation of pregnancy: Secondary | ICD-10-CM | POA: Insufficient documentation

## 2015-07-13 DIAGNOSIS — R102 Pelvic and perineal pain: Secondary | ICD-10-CM | POA: Insufficient documentation

## 2015-07-13 DIAGNOSIS — O26891 Other specified pregnancy related conditions, first trimester: Secondary | ICD-10-CM | POA: Insufficient documentation

## 2015-07-13 LAB — HCG, QUANTITATIVE, PREGNANCY: hCG, Beta Chain, Quant, S: 3367 m[IU]/mL — ABNORMAL HIGH (ref <5)

## 2015-07-13 NOTE — MAU Note (Signed)
HEATHER , CNM  WITH VIRIA, INTERPRETER-   GIVE  EXPLANATION OF  RESULTS.

## 2015-07-13 NOTE — MAU Note (Signed)
PT  SAYS NO BLEEDING.   . NO PAIN.         WAS HERE ON 4-4-  NO BLEEDING - NO PAIN.

## 2015-07-13 NOTE — MAU Provider Note (Signed)
  History     CSN: [id number]  Arrival date and time: 07/13/15 1901   First Provider Initiated Contact with Patient 07/13/15 2029      No chief complaint on file.  HPI Comments: Gabrielle Park is a 35 y.o. [phone] at 2920w3d who presents today for FU HCG. She states that the pain she had two days ago has resolved. She denies any pain at this time. She denies any vaginal bleeding.   Pelvic Pain The patient's primary symptoms include pelvic pain. This is a new problem. Episode onset: 2 days ago.  The problem has been resolved. The patient is experiencing no pain. She is pregnant. Pertinent negatives include no abdominal pain, chills, constipation, diarrhea, dysuria, fever, frequency, nausea, urgency or vomiting. The vaginal discharge was normal. There has been no bleeding. Her menstrual history has been regular (LMP 05/29/15 ).    Past Medical History  Diagnosis Date  . Hypertension   . Vaginal Pap smear, abnormal     No past surgical history on file.  Family History  Problem Relation Age of Onset  . Diabetes Maternal Aunt   . Hypertension Mother     Social History  Substance Use Topics  . Smoking status: Never Smoker   . Smokeless tobacco: Never Used  . Alcohol Use: No     Comment: socially    Allergies: No Known Allergies  Prescriptions prior to admission  Medication Sig Dispense Refill Last Dose  . labetalol (NORMODYNE) 200 MG tablet Take 1 tablet (200 mg total) by mouth 2 (two) times daily. 60 tablet 2     Review of Systems  Constitutional: Negative for fever and chills.  Gastrointestinal: Negative for nausea, vomiting, abdominal pain, diarrhea and constipation.  Genitourinary: Positive for pelvic pain. Negative for dysuria, urgency and frequency.   Physical Exam   Blood pressure 135/83, pulse 56, temperature 98.3 F (36.8 C), temperature source Oral, resp. rate 20, last menstrual period 05/29/2015.  Physical Exam  Nursing note and vitals  reviewed. Constitutional: She is oriented to person, place, and time. She appears well-developed and well-nourished. No distress.  HENT:  Head: Normocephalic.  Cardiovascular: Normal rate.   Respiratory: Effort normal.  GI: Soft. There is no tenderness.  Musculoskeletal: Normal range of motion.  Neurological: She is alert and oriented to person, place, and time.  Skin: Skin is warm and dry.  Psychiatric: She has a normal mood and affect.   Results for Gabrielle DoppCASTRO-CASTRO, Sabria (MRN [id number]) as of 07/13/2015 20:28  Ref. Range 07/11/2015 19:30 07/11/2015 20:02 07/13/2015 17:30  HCG, Beta Chain, Quant, S Latest Ref Range: <5 mIU/mL 1770 (H)  3367 (H)   MAU Course  Procedures  MDM   Assessment and Plan   1. Pregnancy, location unknown    DC home Comfort measures reviewed  1st Trimester precautions  Bleeding precautions Ectopic precautions RX: none  Return to MAU as needed FU with OB as planned  Follow-up Information    [facility]   Contact information:   [address] [place] [id number] [phone]       [facility]   Specialty:  Radiology   Why:  They will call you with an appointment   Contact information:   [address] [id number] mc [place] [phone]      Tawnya CrookHogan, Asencion Guisinger Donovan 07/13/2015, 8:29 PM

## 2015-07-13 NOTE — Discharge Instructions (Signed)
First Trimester of Pregnancy The first trimester of pregnancy is from week 1 until the end of week 12 (months 1 through 3). A week after a sperm fertilizes an egg, the egg will implant on the wall of the uterus. This embryo will begin to develop into a baby. Genes from you and your partner are forming the baby. The female genes determine whether the baby is a boy or a girl. At 6-8 weeks, the eyes and face are formed, and the heartbeat can be seen on ultrasound. At the end of 12 weeks, all the baby's organs are formed.  Now that you are pregnant, you will want to do everything you can to have a healthy baby. Two of the most important things are to get good prenatal care and to follow your health care provider's instructions. Prenatal care is all the medical care you receive before the baby's birth. This care will help prevent, find, and treat any problems during the pregnancy and childbirth. BODY CHANGES Your body goes through many changes during pregnancy. The changes vary from woman to woman.   You may gain or lose a couple of pounds at first.  You may feel sick to your stomach (nauseous) and throw up (vomit). If the vomiting is uncontrollable, call your health care provider.  You may tire easily.  You may develop headaches that can be relieved by medicines approved by your health care provider.  You may urinate more often. Painful urination may mean you have a bladder infection.  You may develop heartburn as a result of your pregnancy.  You may develop constipation because certain hormones are causing the muscles that push waste through your intestines to slow down.  You may develop hemorrhoids or swollen, bulging veins (varicose veins).  Your breasts may begin to grow larger and become tender. Your nipples may stick out more, and the tissue that surrounds them (areola) may become darker.  Your gums may bleed and may be sensitive to brushing and flossing.  Dark spots or blotches (chloasma,  mask of pregnancy) may develop on your face. This will likely fade after the baby is born.  Your menstrual periods will stop.  You may have a loss of appetite.  You may develop cravings for certain kinds of food.  You may have changes in your emotions from day to day, such as being excited to be pregnant or being concerned that something may go wrong with the pregnancy and baby.  You may have more vivid and strange dreams.  You may have changes in your hair. These can include thickening of your hair, rapid growth, and changes in texture. Some women also have hair loss during or after pregnancy, or hair that feels dry or thin. Your hair will most likely return to normal after your baby is born. WHAT TO EXPECT AT YOUR PRENATAL VISITS During a routine prenatal visit:  You will be weighed to make sure you and the baby are growing normally.  Your blood pressure will be taken.  Your abdomen will be measured to track your baby's growth.  The fetal heartbeat will be listened to starting around week 10 or 12 of your pregnancy.  Test results from any previous visits will be discussed. Your health care provider may ask you:  How you are feeling.  If you are feeling the baby move.  If you have had any abnormal symptoms, such as leaking fluid, bleeding, severe headaches, or abdominal cramping.  If you are using any tobacco products,   including cigarettes, chewing tobacco, and electronic cigarettes.  If you have any questions. Other tests that may be performed during your first trimester include:  Blood tests to find your blood type and to check for the presence of any previous infections. They will also be used to check for low iron levels (anemia) and Rh antibodies. Later in the pregnancy, blood tests for diabetes will be done along with other tests if problems develop.  Urine tests to check for infections, diabetes, or protein in the urine.  An ultrasound to confirm the proper growth  and development of the baby.  An amniocentesis to check for possible genetic problems.  Fetal screens for spina bifida and Down syndrome.  You may need other tests to make sure you and the baby are doing well.  HIV (human immunodeficiency virus) testing. Routine prenatal testing includes screening for HIV, unless you choose not to have this test. HOME CARE INSTRUCTIONS  Medicines  Follow your health care provider's instructions regarding medicine use. Specific medicines may be either safe or unsafe to take during pregnancy.  Take your prenatal vitamins as directed.  If you develop constipation, try taking a stool softener if your health care provider approves. Diet  Eat regular, well-balanced meals. Choose a variety of foods, such as meat or vegetable-based protein, fish, milk and low-fat dairy products, vegetables, fruits, and whole grain breads and cereals. Your health care provider will help you determine the amount of weight gain that is right for you.  Avoid raw meat and uncooked cheese. These carry germs that can cause birth defects in the baby.  Eating four or five small meals rather than three large meals a day may help relieve nausea and vomiting. If you start to feel nauseous, eating a few soda crackers can be helpful. Drinking liquids between meals instead of during meals also seems to help nausea and vomiting.  If you develop constipation, eat more high-fiber foods, such as fresh vegetables or fruit and whole grains. Drink enough fluids to keep your urine clear or pale yellow. Activity and Exercise  Exercise only as directed by your health care provider. Exercising will help you:  Control your weight.  Stay in shape.  Be prepared for labor and delivery.  Experiencing pain or cramping in the lower abdomen or low back is a good sign that you should stop exercising. Check with your health care provider before continuing normal exercises.  Try to avoid standing for long  periods of time. Move your legs often if you must stand in one place for a long time.  Avoid heavy lifting.  Wear low-heeled shoes, and practice good posture.  You may continue to have sex unless your health care provider directs you otherwise. Relief of Pain or Discomfort  Wear a good support bra for breast tenderness.   Take warm sitz baths to soothe any pain or discomfort caused by hemorrhoids. Use hemorrhoid cream if your health care provider approves.   Rest with your legs elevated if you have leg cramps or low back pain.  If you develop varicose veins in your legs, wear support hose. Elevate your feet for 15 minutes, 3-4 times a day. Limit salt in your diet. Prenatal Care  Schedule your prenatal visits by the twelfth week of pregnancy. They are usually scheduled monthly at first, then more often in the last 2 months before delivery.  Write down your questions. Take them to your prenatal visits.  Keep all your prenatal visits as directed by your   health care provider. Safety  Wear your seat belt at all times when driving.  Make a list of emergency phone numbers, including numbers for family, friends, the hospital, and police and fire departments. General Tips  Ask your health care provider for a referral to a local prenatal education class. Begin classes no later than at the beginning of month 6 of your pregnancy.  Ask for help if you have counseling or nutritional needs during pregnancy. Your health care provider can offer advice or refer you to specialists for help with various needs.  Do not use hot tubs, steam rooms, or saunas.  Do not douche or use tampons or scented sanitary pads.  Do not cross your legs for long periods of time.  Avoid cat litter boxes and soil used by cats. These carry germs that can cause birth defects in the baby and possibly loss of the fetus by miscarriage or stillbirth.  Avoid all smoking, herbs, alcohol, and medicines not prescribed by  your health care provider. Chemicals in these affect the formation and growth of the baby.  Do not use any tobacco products, including cigarettes, chewing tobacco, and electronic cigarettes. If you need help quitting, ask your health care provider. You may receive counseling support and other resources to help you quit.  Schedule a dentist appointment. At home, brush your teeth with a soft toothbrush and be gentle when you floss. SEEK MEDICAL CARE IF:   You have dizziness.  You have mild pelvic cramps, pelvic pressure, or nagging pain in the abdominal area.  You have persistent nausea, vomiting, or diarrhea.  You have a bad smelling vaginal discharge.  You have pain with urination.  You notice increased swelling in your face, hands, legs, or ankles. SEEK IMMEDIATE MEDICAL CARE IF:   You have a fever.  You are leaking fluid from your vagina.  You have spotting or bleeding from your vagina.  You have severe abdominal cramping or pain.  You have rapid weight gain or loss.  You vomit blood or material that looks like coffee grounds.  You are exposed to German measles and have never had them.  You are exposed to fifth disease or chickenpox.  You develop a severe headache.  You have shortness of breath.  You have any kind of trauma, such as from a fall or a car accident.   This information is not intended to replace advice given to you by your health care provider. Make sure you discuss any questions you have with your health care provider.   Document Released: 03/19/2001 Document Revised: 04/15/2014 Document Reviewed: 02/02/2013 Elsevier Interactive Patient Education 2016 Elsevier Inc.  

## 2015-07-14 ENCOUNTER — Ambulatory Visit: Payer: Self-pay | Admitting: Obstetrics and Gynecology

## 2015-07-24 ENCOUNTER — Encounter (HOSPITAL_COMMUNITY): Payer: Self-pay | Admitting: Student

## 2015-07-24 ENCOUNTER — Encounter: Payer: Self-pay | Admitting: Student

## 2015-07-24 ENCOUNTER — Ambulatory Visit (INDEPENDENT_AMBULATORY_CARE_PROVIDER_SITE_OTHER): Payer: Self-pay | Admitting: Student

## 2015-07-24 ENCOUNTER — Ambulatory Visit (HOSPITAL_COMMUNITY)
Admission: RE | Admit: 2015-07-24 | Discharge: 2015-07-24 | Disposition: A | Discharge status: Home / Self Care | Payer: No Typology Code available for payment source | Source: Ambulatory Visit | Attending: Advanced Practice Midwife | Admitting: Advanced Practice Midwife

## 2015-07-24 DIAGNOSIS — O3481 Maternal care for other abnormalities of pelvic organs, first trimester: Secondary | ICD-10-CM | POA: Insufficient documentation

## 2015-07-24 DIAGNOSIS — O43891 Other placental disorders, first trimester: Secondary | ICD-10-CM

## 2015-07-24 DIAGNOSIS — N83201 Unspecified ovarian cyst, right side: Secondary | ICD-10-CM | POA: Insufficient documentation

## 2015-07-24 DIAGNOSIS — O208 Other hemorrhage in early pregnancy: Secondary | ICD-10-CM | POA: Insufficient documentation

## 2015-07-24 DIAGNOSIS — O468X1 Other antepartum hemorrhage, first trimester: Secondary | ICD-10-CM

## 2015-07-24 DIAGNOSIS — O418X10 Other specified disorders of amniotic fluid and membranes, first trimester, not applicable or unspecified: Secondary | ICD-10-CM

## 2015-07-24 DIAGNOSIS — Z3A01 Less than 8 weeks gestation of pregnancy: Secondary | ICD-10-CM | POA: Insufficient documentation

## 2015-07-24 DIAGNOSIS — Z36 Encounter for antenatal screening of mother: Secondary | ICD-10-CM | POA: Insufficient documentation

## 2015-07-24 DIAGNOSIS — Z3491 Encounter for supervision of normal pregnancy, unspecified, first trimester: Secondary | ICD-10-CM

## 2015-07-24 DIAGNOSIS — O3680X0 Pregnancy with inconclusive fetal viability, not applicable or unspecified: Secondary | ICD-10-CM

## 2015-07-24 NOTE — Progress Notes (Signed)
Patient presents today to discuss results from follow up ob ultrasound.  Discussed results of ultrasound including new EDD & subchorionic hemorrhage.  Denies complaints today. Plans on getting care with the health department.  Video interpreter used.   [place] Ob Transvaginal  07/24/2015  CLINICAL DATA:  Pregnancy of unknown dating.  Viability. EXAM: TRANSVAGINAL OB ULTRASOUND TECHNIQUE: Transvaginal ultrasound was performed for complete evaluation of the gestation as well as the maternal uterus, adnexal regions, and pelvic cul-de-sac. COMPARISON:  07/11/2015 FINDINGS: Intrauterine gestational sac: Visualized, normal shape Yolk sac:  Visualized Embryo:  Visualized Cardiac Activity: Visualized Heart Rate: 120 bpm MSD:   mm    w     d CRL:   7  mm   6 w 4 d                  US EDC: 03/14/2016 Subchorionic hemorrhage:  Small subchorionic hemorrhage. Maternal uterus/adnexae: 4.8 cm simple appearing cyst in the right ovary. No left adnexal abnormality. Trace free fluid in the cul-de-sac. IMPRESSION: Six week 4 day intrauterine pregnancy. Fetal heart rate 120 beats per minute. Small subchorionic hemorrhage. 4.8 cm simple appearing right ovarian cyst. Electronically Signed   By: Charlett NoseKevin  Dover M.D.   On: 07/24/2015 13:40      Judeth HornErin Kamille Toomey, NP

## 2015-07-24 NOTE — Patient Instructions (Signed)
Primer trimestre de embarazo (First Trimester of Pregnancy) El primer trimestre de embarazo se extiende desde la semana1 hasta el final de la semana12 (mes1 al mes3). Durante este tiempo, el beb comenzar a desarrollarse dentro suyo. Entre la semana6 y la8, se forman los ojos y el rostro, y los latidos del corazn pueden verse en la ecografa. Al final de las 12semanas, todos los rganos del beb estn formados. La atencin prenatal es toda la asistencia mdica que usted recibe antes del nacimiento del beb. Asegrese de recibir una buena atencin prenatal y de seguir todas las indicaciones del mdico. CUIDADOS EN EL HOGAR  Medicamentos:  Tome los medicamentos solamente como se lo haya indicado el mdico. Algunos medicamentos se pueden tomar durante el embarazo y otros no.  Tome las vitaminas prenatales como se lo haya indicado el mdico.  Tome el medicamento que la ayuda a defecar (laxante suave) segn sea necesario, si el mdico lo autoriza. Dieta  Ingiera alimentos saludables de manera regular.  El mdico le indicar la cantidad de peso que puede aumentar.  No coma carne cruda ni quesos sin cocinar.  Si tiene malestar estomacal (nuseas) o vomita:  Ingiera 4 o 5comidas pequeas por da en lugar de 3abundantes.  Intente comer algunas galletitas saladas.  Beba lquidos entre las comidas, en lugar de hacerlo durante estas.  Si tiene dificultad para defecar (estreimiento):  Consuma alimentos con alto contenido de fibra, como verduras y frutas frescos, y cereales integrales.  Beba suficiente lquido para mantener el pis (orina) claro o de color amarillo plido. Actividad y ejercicios  Haga ejercicios solamente como se lo haya indicado el mdico. Deje de hacer ejercicios si tiene clicos o dolor en la parte baja del vientre (abdomen) o en la cintura.  Intente no estar de pie durante mucho tiempo. Mueva las piernas con frecuencia si debe estar de pie en un lugar durante  mucho tiempo.  Evite levantar pesos excesivos.  Use zapatos con tacones bajos. Mantenga una buena postura al sentarse y pararse.  Puede tener relaciones sexuales, a menos que el mdico le indique lo contrario. Alivio del dolor o las molestias  Use un sostn que le brinde buen soporte si le duelen las mamas.  Dese baos con agua tibia (baos de asiento) para aliviar el dolor o las molestias a causa de las hemorroides. Use crema antihemorroidal si el mdico se lo permite.  Descanse con las piernas elevadas si tiene calambres o dolor de cintura.  Use medias de descanso si tiene las venas de las piernas hinchadas y abultadas (venas varicosas). Eleve los pies durante 15minutos, 3 o 4veces por da. Limite la cantidad de sal en su dieta. Cuidados prenatales  Programe las visitas prenatales para la semana12 de embarazo.  Escriba sus preguntas. Llvelas cuando concurra a las visitas prenatales.  Concurra a todas las visitas prenatales como se lo haya indicado el mdico. Seguridad  Colquese el cinturn de seguridad cuando conduzca.  Haga una lista con los nmeros de telfono en caso de emergencia, en la cual deben incluirse los nmeros de los familiares, los amigos, el hospital y los departamentos de polica y de bomberos. Consejos generales  Pdale al mdico que la derive a clases prenatales en su localidad. Debe comenzar a tomar las clases antes de entrar en el mes6 de embarazo.  Pida ayuda si necesita asesoramiento o asistencia con la alimentacin. El mdico puede aconsejarla o indicarle dnde recurrir para recibir ayuda.  No se d baos de inmersin en agua caliente,   baos turcos ni saunas.  No se haga duchas vaginales ni use tampones o toallas higinicas perfumadas.  No mantenga las piernas cruzadas durante [place].  Evite el contacto con las bandejas sanitarias de los gatos y la tierra que estos animales usan.  No fume, no consuma hierbas ni beba alcohol. No tome  frmacos que el mdico no haya autorizado.  No consuma ningn producto que contenga tabaco, lo que incluye cigarrillos, tabaco de Theatre manager o Administrator, Civil Service. Si necesita ayuda para dejar de fumar, consulte al [employer]. Puede recibir asesoramiento u otro tipo de apoyo para dejar de fumar.  Visite al dentista. En su casa, lvese los dientes con un cepillo dental suave. Psese el hilo dental con suavidad. SOLICITE AYUDA SI:  Tiene mareos.  Tiene clicos leves o siente presin en la parte baja del vientre.  Siente un dolor persistente en la zona del vientre.  Sigue teniendo [employer], vomita o las heces son lquidas (diarrea).  Observa una secrecin, con mal olor que proviene de la vagina.  Siente dolor al [employer].  Tiene el rostro, las [place], las piernas o los tobillos ms hinchados (inflamados). SOLICITE AYUDA DE INMEDIATO SI:   Tiene fiebre.  Tiene una prdida de lquido por la vagina.  Tiene sangrado o pequeas prdidas vaginales.  Tiene clicos o dolor muy intensos en el vientre.  Sube o baja de peso rpidamente.  Vomita sangre. Puede ser similar a la borra del caf  Est en contacto con personas que tienen rubola, la quinta enfermedad o varicela.  Siente un dolor de cabeza muy intenso.  Le falta el aire.  Sufre cualquier tipo de traumatismo, por ejemplo, debido a una cada o un accidente automovilstico.   Esta informacin no tiene Theme park manager el consejo del mdico. Asegrese de hacerle al mdico cualquier pregunta que tenga.   Document Released: 06/21/2008 Document Revised: 04/15/2014 Elsevier Interactive Patient Education 2016 Elsevier Inc.     Hematoma subcorinico (Subchorionic Hematoma) Un hematoma subcorinico es una acumulacin de sangre entre la pared externa de la placenta y la pared interna del la matriz (tero). La placenta es el rgano que conecta el feto a la pared del tero. La placenta realiza la funcin de alimentacin,  respiracin (oxgeno al feto) y el trabajo de eliminacin de desechos (excrecin) del feto.  Un hematoma subcorinico es la anormalidad ms frecuente encontrada en una ecografa durante el primer trimestre o principios del segundo trimestre del embarazo. Si ha habido poca o ninguna hemorragia vaginal, generalmente los pequeos hematomas se reducen por su propia cuenta y no afectan al beb ni al Vanetta Mulders. La sangre es absorbida gradualmente durante una o [place]. Cuando la hemorragia comienza ms tarde en el embarazo o el hematoma es ms grande o se produce en una paciente de edad avanzada, el resultado puede no ser tan bueno. Los grandes hematomas pueden agrandarse an ms y [place] las posibilidades de aborto espontneo. El hematoma subcorinico tambin aumenta el riesgo de desprendimiento precoz de la placenta del tero, muerte fetal y Sport and exercise psychologist. INSTRUCCIONES PARA EL CUIDADO EN EL HOGAR   Repose en cama si el mdico se lo recomienda. Aunque el reposo en cama no evitar la hemorragia o un aborto espontneo, su mdico puede recomendarlo.  Evite levantar objetos pesados (ms de 10 libras [4,5 kg]), hacer ejercicio, tener relaciones sexuales o realizar duchas vaginales segn se lo indique el profesional.  Lleve un registro de la cantidad y Hydrographic surveyor de remojo (saturacin) de las toallas higinicas que Nurse, mental health  cada da. Anote esta informacin.  No use tampones.  Cumpla con todas las visitas de control, segn le indique su mdico. El profesional podr pedirle que se realice anlisis de seguimiento, pruebas de Pattenultrasonido o [place]. SOLICITE ATENCIN MDICA DE INMEDIATO SI:   Siente calambres intensos en el estmago, en la espalda, en el abdomen o en la pelvis.  Tiene fiebre.  Elimina cogulos o tejidos grandes. Guarde los tejidos para que su mdico los vea.  Si la hemorragia aumenta o siente mareos, debilidad o tiene episodios de Morrisdaledesmayos.   Esta informacin no tiene Theme park managercomo fin reemplazar el  consejo del mdico. Asegrese de hacerle al mdico cualquier pregunta que tenga.   Document Released: 07/11/2008 Document Revised: 01/13/2013 Elsevier Interactive Patient Education [employer]2016 Elsevier Inc.

## 2015-07-25 ENCOUNTER — Telehealth: Payer: Self-pay | Admitting: *Deleted

## 2015-07-25 NOTE — Telephone Encounter (Signed)
Called patient using Spanish interpreter to let her know I had found an u/s picture of her baby, asked if she would like us to hold it for her so she could come get it. She said she would come get it after work today or tomorrow. Will leave it with front office staff.

## 2015-07-31 ENCOUNTER — Telehealth (HOSPITAL_COMMUNITY): Payer: Self-pay | Admitting: *Deleted

## 2015-07-31 NOTE — Telephone Encounter (Signed)
Patient is currently pregnant and [facility] Patient will be receiving pap smear there. Patient will have appointment there in May. Used interpreter Delorise RoyalsJulie Sowell.

## 2015-09-18 ENCOUNTER — Other Ambulatory Visit (HOSPITAL_COMMUNITY): Payer: Self-pay | Admitting: Nurse Practitioner

## 2015-09-18 DIAGNOSIS — Z3689 Encounter for other specified antenatal screening: Secondary | ICD-10-CM

## 2015-09-18 LAB — OB RESULTS CONSOLE PLATELET COUNT: Platelets: 193 10*3/uL

## 2015-09-18 LAB — OB RESULTS CONSOLE HEPATITIS B SURFACE ANTIGEN: HEP B S AG: NEGATIVE

## 2015-09-18 LAB — OB RESULTS CONSOLE GC/CHLAMYDIA
CHLAMYDIA, DNA PROBE: NEGATIVE
GC PROBE AMP, GENITAL: NEGATIVE

## 2015-09-18 LAB — OB RESULTS CONSOLE RUBELLA ANTIBODY, IGM: RUBELLA: IMMUNE

## 2015-09-18 LAB — OB RESULTS CONSOLE HGB/HCT, BLOOD
HCT: 42 %
Hemoglobin: 13.6 g/dL

## 2015-09-18 LAB — OB RESULTS CONSOLE RPR: RPR: NONREACTIVE

## 2015-09-21 ENCOUNTER — Encounter: Payer: Self-pay | Admitting: *Deleted

## 2015-09-21 DIAGNOSIS — O0990 Supervision of high risk pregnancy, unspecified, unspecified trimester: Secondary | ICD-10-CM | POA: Insufficient documentation

## 2015-09-21 DIAGNOSIS — O10919 Unspecified pre-existing hypertension complicating pregnancy, unspecified trimester: Secondary | ICD-10-CM | POA: Insufficient documentation

## 2015-09-21 DIAGNOSIS — R87612 Low grade squamous intraepithelial lesion on cytologic smear of cervix (LGSIL): Secondary | ICD-10-CM

## 2015-09-25 ENCOUNTER — Encounter: Payer: Self-pay | Admitting: Obstetrics & Gynecology

## 2015-09-25 ENCOUNTER — Ambulatory Visit (INDEPENDENT_AMBULATORY_CARE_PROVIDER_SITE_OTHER): Payer: Self-pay | Admitting: Obstetrics & Gynecology

## 2015-09-25 VITALS — BP 96/69 | HR 58 | Temp 97.8°F | Wt 128.4 lb

## 2015-09-25 DIAGNOSIS — O10912 Unspecified pre-existing hypertension complicating pregnancy, second trimester: Secondary | ICD-10-CM

## 2015-09-25 DIAGNOSIS — O0992 Supervision of high risk pregnancy, unspecified, second trimester: Secondary | ICD-10-CM

## 2015-09-25 LAB — POCT URINALYSIS DIP (DEVICE)
Bilirubin Urine: NEGATIVE
Glucose, UA: 250 mg/dL — AB
Hgb urine dipstick: NEGATIVE
KETONES UR: NEGATIVE mg/dL
Leukocytes, UA: NEGATIVE
Nitrite: NEGATIVE
PH: 6 (ref 5.0–8.0)
PROTEIN: NEGATIVE mg/dL
Urobilinogen, UA: 1 mg/dL (ref 0.0–1.0)

## 2015-09-25 LAB — COMPREHENSIVE METABOLIC PANEL
ALBUMIN: 3.5 g/dL — AB (ref 3.6–5.1)
ALK PHOS: 59 U/L (ref 33–115)
ALT: 10 U/L (ref 6–29)
AST: 14 U/L (ref 10–30)
BILIRUBIN TOTAL: 0.2 mg/dL (ref 0.2–1.2)
BUN: 10 mg/dL (ref 7–25)
CALCIUM: 8.7 mg/dL (ref 8.6–10.2)
CO2: 23 mmol/L (ref 20–31)
CREATININE: 0.62 mg/dL (ref 0.50–1.10)
Chloride: 104 mmol/L (ref 98–110)
GLUCOSE: 100 mg/dL — AB (ref 65–99)
Potassium: 4.2 mmol/L (ref 3.5–5.3)
Sodium: 137 mmol/L (ref 135–146)
Total Protein: 6.4 g/dL (ref 6.1–8.1)

## 2015-09-25 LAB — CBC
HEMATOCRIT: 35.4 % (ref 35.0–45.0)
Hemoglobin: 11.5 g/dL — ABNORMAL LOW (ref 11.7–15.5)
MCH: 25.8 pg — AB (ref 27.0–33.0)
MCHC: 32.5 g/dL (ref 32.0–36.0)
MCV: 79.6 fL — AB (ref 80.0–100.0)
MPV: 10.8 fL (ref 7.5–12.5)
Platelets: 216 10*3/uL (ref 140–400)
RBC: 4.45 MIL/uL (ref 3.80–5.10)
RDW: 16.8 % — AB (ref 11.0–15.0)
WBC: 8.2 10*3/uL (ref 3.8–10.8)

## 2015-09-25 MED ORDER — ASPIRIN EC 81 MG PO TBEC: 81.0000 mg | DELAYED_RELEASE_TABLET | Freq: Every day | ORAL | Status: DC

## 2015-09-25 NOTE — Progress Notes (Signed)
Subjective:  Gabrielle Park is a 35 y.o. G4P3003 at 2357w4d being seen today [facility]  She is currently monitored for the following issues for this high-risk pregnancy and has Oligomenorrhea; DUB (dysfunctional uterine bleeding); Pap smear abnormality of cervix with LGSIL; Supervision of high risk pregnancy, antepartum; and Chronic hypertension complicating pregnancy, antepartum on her problem list. Patient is Spanish-speaking only, Spanish interpreter present for this encounter.  Patient reports no complaints.  Contractions: Not present. Vag. Bleeding: None.  Movement: Absent. Denies leaking of fluid.   The following portions of the patient's history were reviewed and updated as appropriate: allergies, current medications, past family history, past medical history, past social history, past surgical history and problem list. Problem list updated.  Objective:   Filed Vitals:   09/25/15 1507  BP: 96/69  Pulse: 58  Temp: 97.8 F (36.6 C)  Weight: 128 lb 6.4 oz (58.242 kg)    Fetal Status: Fetal Heart Rate (bpm): 144   Movement: Absent     General:  Alert, oriented and cooperative. Patient is in no acute distress.  Skin: Skin is warm and dry. No rash noted.   Cardiovascular: Normal heart rate noted  Respiratory: Normal respiratory effort, no problems with respiration noted  Abdomen: Soft, gravid, appropriate for gestational age. Pain/Pressure: Present     Pelvic: Cervical exam deferred        Extremities: Normal range of motion.  Edema: None  Mental Status: Normal mood and affect. Normal behavior. Normal judgment and thought content.   Urinalysis: Urine Protein: Negative Urine Glucose: 2+  Assessment and Plan:  Pregnancy: G4P3003 at 8357w4d  1. Preexisting hypertension complicating pregnancy, antepartum, second trimester Continue Labetalol 200 mg po bid - CBC - Comprehensive metabolic panel - Protein / creatinine ratio, urine - aspirin EC 81 MG  tablet; Take 1 tablet (81 mg total) by mouth daily. Take after 12 weeks for prevention of preeclampsia later in pregnancy  Dispense: 300 tablet; Refill: 2 Discussed implications of CHTN in pregnancy, need for antenatal testing and frequent ultrasounds/prenatal visits, need for optimizing BP control to decrease CHTN/preeclampsia associated maternal-fetal morbidity and mortality. Will check baseline labs today.  2. Supervision of high risk pregnancy, antepartum, second trimester - AFP, Quad Screen The nature [facility] [place] [facility] with multiple MDs and other Advanced Practice Providers was explained to patient; also emphasized that residents, students are part of our team. No other complaints or concerns.  Routine obstetric precautions reviewed. Please refer to After Visit Summary for other counseling recommendations.  Return in about 4 weeks (around 10/23/2015) for OB Visit.   Tereso NewcomerUgonna A Armonee Bojanowski, MD

## 2015-09-25 NOTE — Patient Instructions (Signed)
Hipertensión durante el embarazo  (Hypertension During Pregnancy)  Cuando se sufre hipertensión arterial o presión arterial alta, existe una presión extra en el interior de los vasos sanguíneos que llevan la sangre desde el corazón al resto del cuerpo (arterias). Esto puede suceder en cualquier etapa de la vida, incluido el embarazo. La hipertensión durante el embarazo puede causar problemas para usted y el bebé. Es posible que el bebé no tenga el peso adecuado al nacer o puede que nazca antes de tiempo (prematuro). En los casos muy graves de hipertensión durante el embarazo puede estar en peligro la vida.   Durante el embarazo se pueden presentar diferentes tipos de hipertensión arterial. Estos incluyen:  · Hipertensión crónica. Esto sucede cuando una mujer sufre de hipertensión arterial antes del embarazo y continúa durante el este.  · Hipertensión gestacional. Es cuando se desarrolla la hipertensión durante el embarazo.  · Preeclampsia o toxemia del embarazo. Es un tipo muy grave de hipertensión que se desarrolla solo durante el embarazo. Afecta a todo el cuerpo y puede ser muy peligrosa para la madre y el bebé.  La hipertensión gestacional y preeclampsia por lo general, desaparecen después del nacimiento del bebé. La presión arterial probablemente se estabilizará en un período de 6 semanas. Las mujeres que sufren de hipertensión durante el embarazo tienen una mayor probabilidad de desarrollar hipertensión en etapas posteriores de la vida o en embarazos futuros.  FACTORES DE RIESGO  Existen ciertos factores que aumentan las probabilidades de que desarrolle hipertensión durante el embarazo. Estos incluyen:  · Tener hipertensión arterial antes del embarazo.  · Haber sufrido hipertensión arterial durante un embarazo anterior.  · Tener sobrepeso.  · Ser mayor de 40 años.  · Estar embarazada de más de un bebé.  · Tener diabetes o problemas renales.  SIGNOS Y SÍNTOMAS  La hipertensión arterial gestacional y crónica en  raras ocasiones provoca síntomas. La preeclampsia causa síntomas, que pueden ser:  · Aumento de las proteínas en la orina. El médico la controlará en cada visita prenatal.  · Hinchazón de las manos y la cara.  · Aumento rápido de peso.  · Dolores de cabeza.  · Cambios en la visión.  · Molestias al ver la luz.  · Dolor abdominal, especialmente en el área superior derecha.  · Dolor en el pecho.  · Falta de aire.  · Aumento de los reflejos.  · Convulsiones. Las convulsiones ocurren en una forma más grave de preeclampsia, llamada eclampsia.  DIAGNÓSTICO   Es posible que se le diagnostique hipertensión arterial durante un control prenatal regular. En cada visita prenatal, es posible que le realicen los siguientes exámenes:  · Control de la presión arterial.  · Análisis de orina para detectar proteínas en la orina.  El tipo de hipertensión que se diagnostica depende del momento en que se desarrolló. También depende de la lectura de su presión arterial específica.  · El desarrollo de hipertensión arterial antes de la semana 20 de embarazo es consecuente con la hipertensión arterial crónica.  · El desarrollo de hipertensión arterial después de la semana 20 de embarazo es consecuente con la hipertensión gestacional.  · Hipertensión con aumento de la proteína urinaria se diagnostica como preeclampsia.  · Las mediciones de la presión arterial de más de 160 sistólica o 110 diastólica son un signo de preeclampsia grave.  TRATAMIENTO  El tratamiento para la hipertensión durante el embarazo varía. Depende del tipo de hipertensión y de su gravedad.  · Si toma medicamentos para la hipertensión crónica,   puede que tenga que cambiarlos.    Los medicamentos llamados inhibidores de la ECA no deben tomarse durante el embarazo.    Para las mujeres que tienen factores de riesgo de preeclampsia pueden recomendarse bajas dosis de aspirina.  · Si usted tiene hipertensión gestacional, tendrá que tomar un medicamento para la presión arterial que  sea seguro durante el embarazo. El médico le recomendará el medicamento apropiado.  · Si tiene preeclampsia grave, es posible que tenga que permanecer en el hospital. Los médicos la controlarán a usted y al bebé muy de cerca. Probablemente deba tomar un medicamento denominado sulfato de magnesio para prevenir las convulsiones y reducir la presión arterial.  · A veces es necesario inducir un parto prematuro. Por ejemplo, si la afección empeora. Se hace para protegerlos a usted y a su bebé. La única cura para la preeclampsia es el parto.  · Su médico puede recomendarle que tome una aspirina de dosis baja (81 mg) cada día, a fin de ayudar a prevenir la hipertensión durante el embarazo, si está en riesgo de padecer preeclampsia. Puede estar en riesgo de padecer preeclampsia si:    Padeció preeclampsia o eclampsia durante un embarazo anterior.    Su bebé no creció según lo previsto durante un embarazo anterior.    Tuvo un parto prematuro en un embarazo anterior.    Experimentó una separación de la placenta desde el útero (desprendimiento abrupto de la placenta) durante un embarazo anterior.    Perdió un bebé en un embarazo anterior.    Está embarazada de más de un bebé.    Padece otras afecciones médicas, como diabetes o una enfermedad autoinmunitaria.  INSTRUCCIONES PARA EL CUIDADO EN EL HOGAR  · Programe y concurra a todas las citas de control prenatal regulares. Esto es importante.  · Tome los medicamentos solamente como se lo haya indicado el médico. Dígale a su médico todos los medicamentos que toma.  · Consuma la menor cantidad posible de sal.  · Realice actividad física con regularidad.  · No beba alcohol.  · No fume ni use productos que contengan tabaco.  · No beba productos con cafeína.  · Acuéstese sobre el lado izquierdo cuando haga reposo.  SOLICITE ATENCIÓN MÉDICA DE INMEDIATO SI:  · Siente un dolor abdominal intenso.  · Presenta hinchazón repentina en las manos, los tobillos o el rostro.  · Aumenta más de 4  libras (1,8 kg) en una semana.  · Vomita repetidas veces.  · Tiene una hemorragia vaginal abundante.  · No siente que el bebé se mueva mucho.  · Tiene dolores de cabeza.  · Tiene visión doble o borrosa.  · Tiene calambres o espasmos musculares.  · Le falta el aire.  · Tiene los labios y las uñas de los dedos de las manos de color azul.  · Observa sangre en la orina.  ASEGÚRESE DE QUE:  · Comprende estas instrucciones.  · Controlará su afección.  · Recibirá ayuda de inmediato si no mejora o si empeora.     Esta información no tiene como fin reemplazar el consejo del médico. Asegúrese de hacerle al médico cualquier pregunta que tenga.     Document Released: 03/14/2011 Document Revised: 04/15/2014  Elsevier Interactive Patient Education ©2016 [employer]

## 2015-09-25 NOTE — Progress Notes (Signed)
Here for first visit. Given new patient education booklets. Used Video interpreter Seward GraterMaggie [phone].

## 2015-09-26 LAB — PROTEIN / CREATININE RATIO, URINE
Creatinine, Urine: 233 mg/dL (ref 20–320)
PROTEIN CREATININE RATIO: 60 mg/g{creat} (ref 21–161)
Total Protein, Urine: 14 mg/dL (ref 5–24)

## 2015-09-27 LAB — AFP, QUAD SCREEN
AFP: 32.2 ng/mL
Age Alone: 1:442 {titer}
CURR GEST AGE: 15.6 wk
Down Syndrome Scr Risk Est: 1:4040 {titer}
HCG, Total: 36.97 IU/mL
INH: 150.5 pg/mL
Interpretation-AFP: NEGATIVE
MOM FOR AFP: 1.01
MOM FOR INH: 0.97
MoM for hCG: 0.92
OPEN SPINA BIFIDA: NEGATIVE
Osb Risk: 1:13100 {titer}
Tri 18 Scr Risk Est: NEGATIVE
Trisomy 18 (Edward) Syndrome Interp.: 1:14200 {titer}
UE3 MOM: 0.78
UE3 VALUE: 0.78 ng/mL

## 2015-10-02 ENCOUNTER — Encounter (HOSPITAL_COMMUNITY): Payer: Self-pay | Admitting: Nurse Practitioner

## 2015-10-09 ENCOUNTER — Encounter (HOSPITAL_COMMUNITY): Payer: Self-pay

## 2015-10-09 ENCOUNTER — Ambulatory Visit (HOSPITAL_COMMUNITY)
Admission: RE | Admit: 2015-10-09 | Discharge: 2015-10-09 | Disposition: A | Discharge status: Home / Self Care | Payer: No Typology Code available for payment source | Source: Ambulatory Visit | Attending: Nurse Practitioner | Admitting: Nurse Practitioner

## 2015-10-09 VITALS — BP 130/69 | HR 57 | Wt 130.6 lb

## 2015-10-09 DIAGNOSIS — O10012 Pre-existing essential hypertension complicating pregnancy, second trimester: Secondary | ICD-10-CM | POA: Insufficient documentation

## 2015-10-09 DIAGNOSIS — Z36 Encounter for antenatal screening of mother: Secondary | ICD-10-CM | POA: Insufficient documentation

## 2015-10-09 DIAGNOSIS — Z3689 Encounter for other specified antenatal screening: Secondary | ICD-10-CM

## 2015-10-09 DIAGNOSIS — Z3A17 17 weeks gestation of pregnancy: Secondary | ICD-10-CM | POA: Insufficient documentation

## 2015-10-09 DIAGNOSIS — O10919 Unspecified pre-existing hypertension complicating pregnancy, unspecified trimester: Secondary | ICD-10-CM

## 2015-10-13 ENCOUNTER — Encounter: Payer: Self-pay | Admitting: *Deleted

## 2015-10-19 ENCOUNTER — Encounter: Payer: Self-pay | Admitting: Advanced Practice Midwife

## 2015-10-20 ENCOUNTER — Encounter: Payer: Self-pay | Admitting: Family Medicine

## 2015-10-20 ENCOUNTER — Ambulatory Visit (INDEPENDENT_AMBULATORY_CARE_PROVIDER_SITE_OTHER): Payer: Self-pay | Admitting: Family Medicine

## 2015-10-20 VITALS — BP 120/83 | HR 67 | Wt 131.4 lb

## 2015-10-20 DIAGNOSIS — O10912 Unspecified pre-existing hypertension complicating pregnancy, second trimester: Secondary | ICD-10-CM

## 2015-10-20 DIAGNOSIS — O0992 Supervision of high risk pregnancy, unspecified, second trimester: Secondary | ICD-10-CM

## 2015-10-20 DIAGNOSIS — O10913 Unspecified pre-existing hypertension complicating pregnancy, third trimester: Secondary | ICD-10-CM

## 2015-10-20 DIAGNOSIS — O10919 Unspecified pre-existing hypertension complicating pregnancy, unspecified trimester: Secondary | ICD-10-CM

## 2015-10-20 LAB — POCT URINALYSIS DIP (DEVICE)
Bilirubin Urine: NEGATIVE
GLUCOSE, UA: NEGATIVE mg/dL
Hgb urine dipstick: NEGATIVE
KETONES UR: NEGATIVE mg/dL
Nitrite: NEGATIVE
PH: 5.5 (ref 5.0–8.0)
PROTEIN: 30 mg/dL — AB
Specific Gravity, Urine: 1.025 (ref 1.005–1.030)
UROBILINOGEN UA: 0.2 mg/dL (ref 0.0–1.0)

## 2015-10-20 NOTE — Progress Notes (Signed)
Subjective:  Gabrielle Park is a 35 y.o. G4P3003 at [redacted]w[redacted]d being seen today for ongoing prenatal care.  She is currently monitored for the following issues for this high-risk pregnancy and has Oligomenorrhea; DUB (dysfunctional uterine bleeding); Pap smear abnormality of cervix with LGSIL; Supervision of high risk pregnancy, antepartum; and Chronic hypertension complicating pregnancy, antepartum on her problem list.  Patient reports no complaints.  Contractions: Not present.  .  Movement: Present. Denies leaking of fluid.   The following portions of the patient's history were reviewed and updated as appropriate: allergies, current medications, past family history, past medical history, past social history, past surgical history and problem list. Problem list updated.  Objective:   Filed Vitals:   10/20/15 1007  BP: 120/83  Pulse: 67  Weight: 131 lb 6.4 oz (59.603 kg)    Fetal Status: Fetal Heart Rate (bpm): 151   Movement: Present     General:  Alert, oriented and cooperative. Patient is in no acute distress.  Skin: Skin is warm and dry. No rash noted.   Cardiovascular: Normal heart rate noted  Respiratory: Normal respiratory effort, no problems with respiration noted  Abdomen: Soft, gravid, appropriate for gestational age. Pain/Pressure: Present     Pelvic:  Cervical exam deferred        Extremities: Normal range of motion.  Edema: None  Mental Status: Normal mood and affect. Normal behavior. Normal judgment and thought content.   Urinalysis:      Assessment and Plan:  Pregnancy: G4P3003 at [redacted]w[redacted]d  1. Supervision of high risk pregnancy, antepartum, second trimester FHT and FH normal  2. Preexisting hypertension complicating pregnancy, antepartum, unspecified trimester Continue Labetalol, ASA 81mg .  US in 4 weeks.  Preterm labor symptoms and general obstetric precautions including but not limited to vaginal bleeding, contractions, leaking of fluid and fetal movement were  reviewed in detail with the patient. Please refer to After Visit Summary for other counseling recommendations.  Return in about 4 weeks (around 11/17/2015) for HR OB f/u.   Levie HeritageJacob J Annai Heick, DO

## 2015-11-12 IMAGING — CR DG CHEST 2V
2 series · 2 of 2 positions shown · non-contrast
Comparison: None.

CLINICAL DATA: Chest pain

EXAM:
CHEST  2 VIEW

[w chest pa]
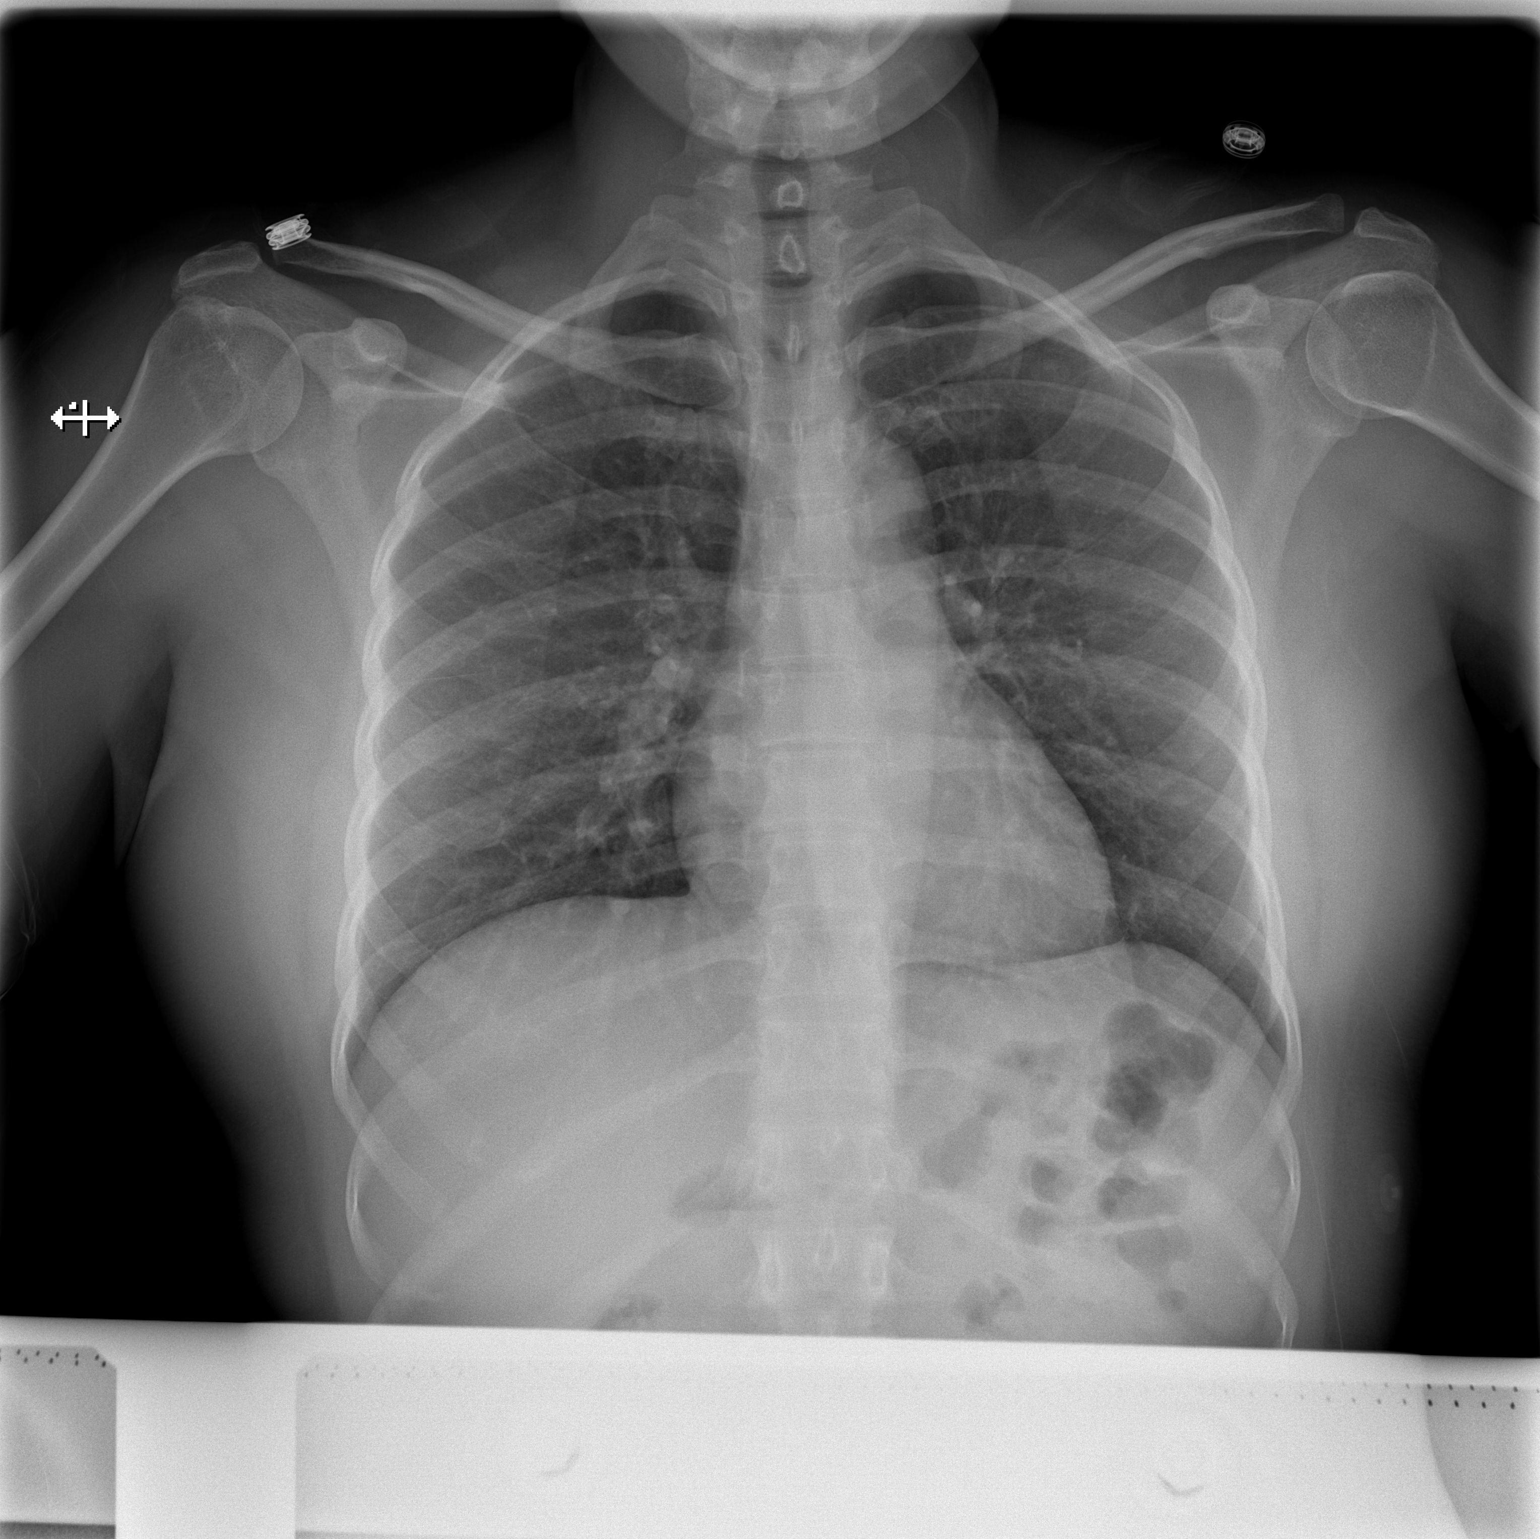

[w chest lat]
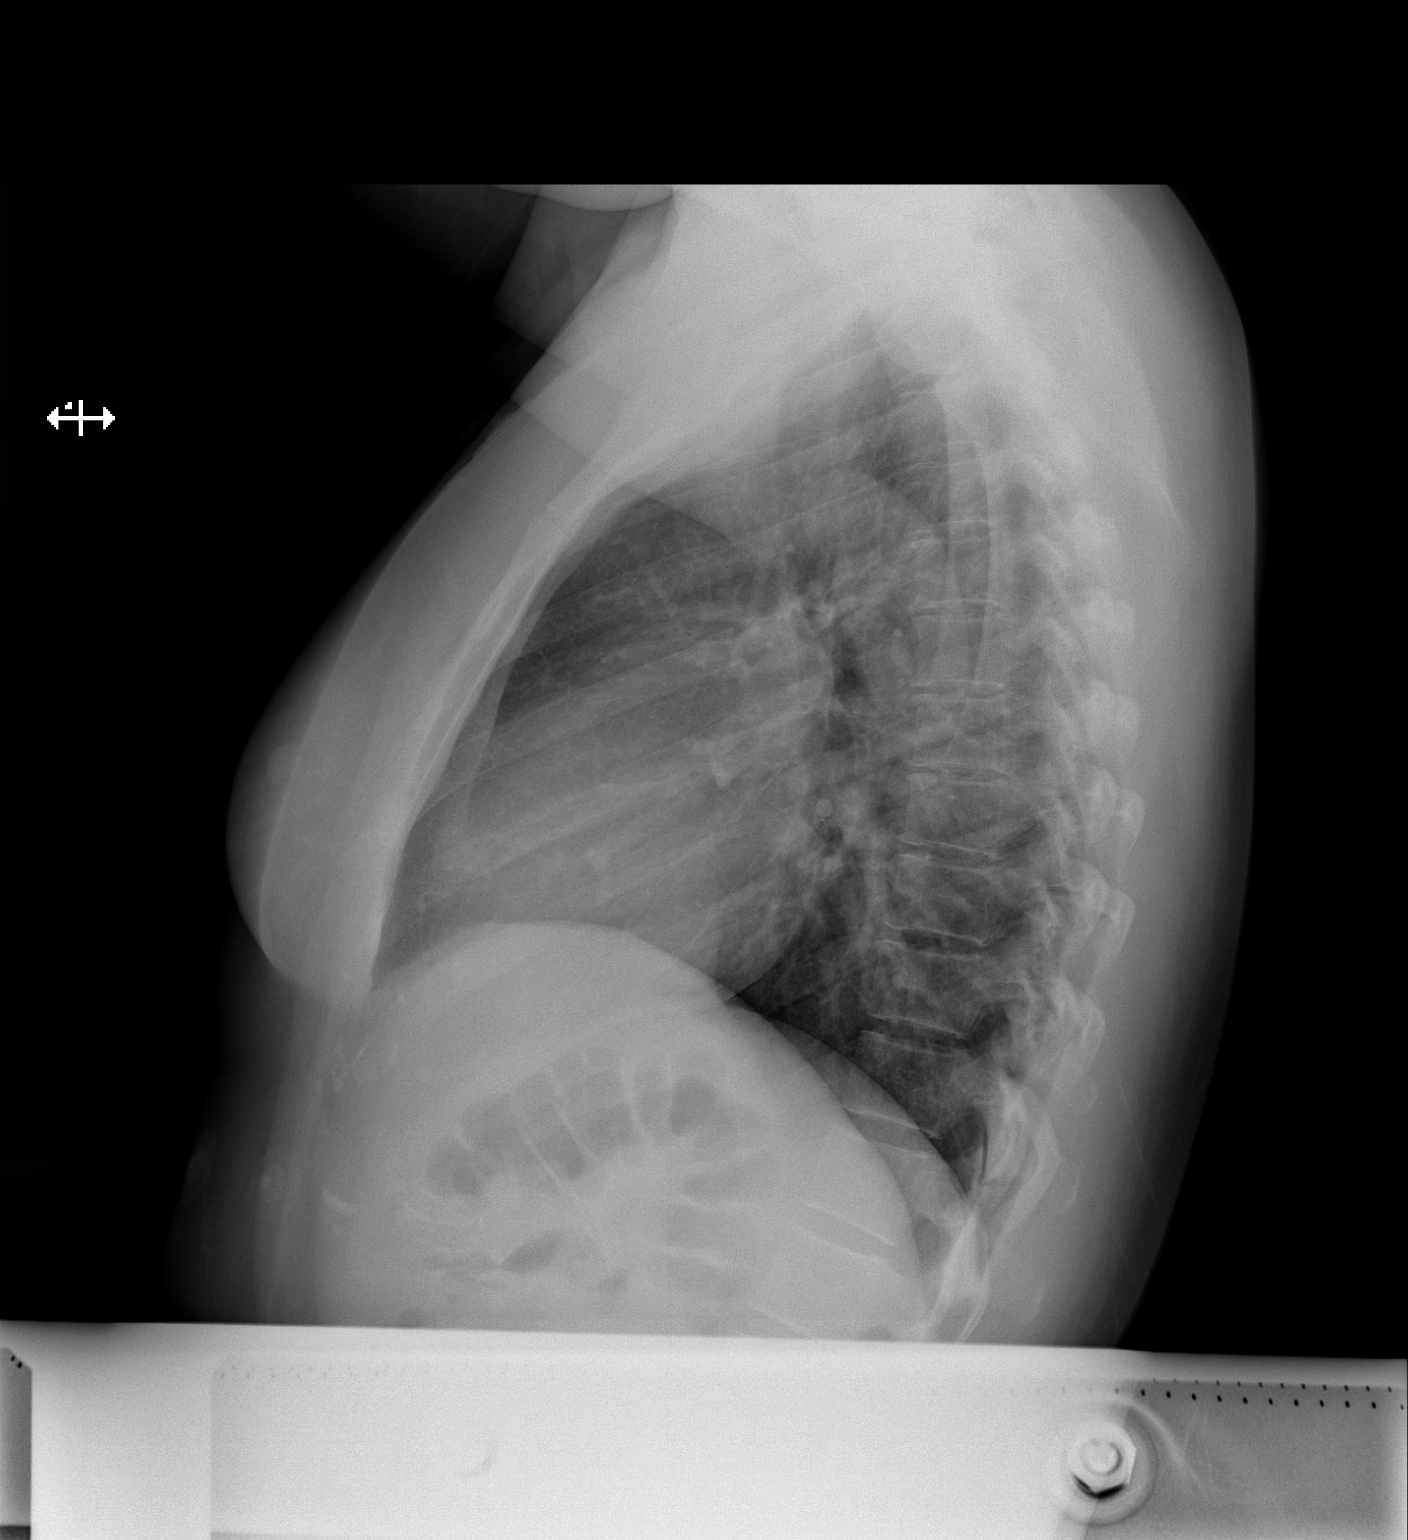

[2 of 2 positions shown; findings below may reference images not displayed]

FINDINGS: The heart size and mediastinal contours are within normal limits.
Both lungs are clear. The visualized skeletal structures are
unremarkable.
IMPRESSION: No active cardiopulmonary disease.

## 2015-11-20 ENCOUNTER — Other Ambulatory Visit (HOSPITAL_COMMUNITY): Payer: Self-pay | Admitting: *Deleted

## 2015-11-20 ENCOUNTER — Encounter (HOSPITAL_COMMUNITY): Payer: Self-pay

## 2015-11-20 ENCOUNTER — Ambulatory Visit (HOSPITAL_COMMUNITY)
Admission: RE | Admit: 2015-11-20 | Discharge: 2015-11-20 | Disposition: A | Discharge status: Home / Self Care | Payer: No Typology Code available for payment source | Source: Ambulatory Visit | Attending: Nurse Practitioner | Admitting: Nurse Practitioner

## 2015-11-20 ENCOUNTER — Other Ambulatory Visit (HOSPITAL_COMMUNITY): Payer: Self-pay | Admitting: Maternal and Fetal Medicine

## 2015-11-20 DIAGNOSIS — IMO0002 Reserved for concepts with insufficient information to code with codable children: Secondary | ICD-10-CM

## 2015-11-20 DIAGNOSIS — O10919 Unspecified pre-existing hypertension complicating pregnancy, unspecified trimester: Secondary | ICD-10-CM

## 2015-11-20 DIAGNOSIS — Z0489 Encounter for examination and observation for other specified reasons: Secondary | ICD-10-CM

## 2015-11-20 DIAGNOSIS — Z3A23 23 weeks gestation of pregnancy: Secondary | ICD-10-CM

## 2015-11-20 DIAGNOSIS — Z36 Encounter for antenatal screening of mother: Secondary | ICD-10-CM | POA: Insufficient documentation

## 2015-11-20 DIAGNOSIS — O10012 Pre-existing essential hypertension complicating pregnancy, second trimester: Secondary | ICD-10-CM | POA: Insufficient documentation

## 2015-11-23 ENCOUNTER — Ambulatory Visit (INDEPENDENT_AMBULATORY_CARE_PROVIDER_SITE_OTHER): Payer: Self-pay | Admitting: Family Medicine

## 2015-11-23 VITALS — BP 122/87 | HR 74 | Wt 136.0 lb

## 2015-11-23 DIAGNOSIS — O0992 Supervision of high risk pregnancy, unspecified, second trimester: Secondary | ICD-10-CM

## 2015-11-23 DIAGNOSIS — O10912 Unspecified pre-existing hypertension complicating pregnancy, second trimester: Secondary | ICD-10-CM

## 2015-11-23 LAB — POCT URINALYSIS DIP (DEVICE)
BILIRUBIN URINE: NEGATIVE
Glucose, UA: 100 mg/dL — AB
HGB URINE DIPSTICK: NEGATIVE
KETONES UR: NEGATIVE mg/dL
LEUKOCYTES UA: NEGATIVE
NITRITE: NEGATIVE
Protein, ur: NEGATIVE mg/dL
SPECIFIC GRAVITY, URINE: 1.02 (ref 1.005–1.030)
Urobilinogen, UA: 0.2 mg/dL (ref 0.0–1.0)
pH: 5.5 (ref 5.0–8.0)

## 2015-11-23 NOTE — Progress Notes (Signed)
Subjective:  Gabrielle Park is a 35 y.o. G4P3003 at [redacted]w[redacted]d being seen today for ongoing prenatal care.  She is currently monitored for the following issues for this high-risk pregnancy and has Oligomenorrhea; DUB (dysfunctional uterine bleeding); Pap smear abnormality of cervix with LGSIL; Supervision of high risk pregnancy, antepartum; and Chronic hypertension complicating pregnancy, antepartum on her problem list.  Patient reports no complaints.  Contractions: Not present. Vag. Bleeding: None.  Movement: Present. Denies leaking of fluid.   The following portions of the patient's history were reviewed and updated as appropriate: allergies, current medications, past family history, past medical history, past social history, past surgical history and problem list. Problem list updated.  Objective:   Vitals:   11/23/15 1034  BP: 122/87  Pulse: 74  Weight: 136 lb (61.7 kg)    Fetal Status: Fetal Heart Rate (bpm): 150 Fundal Height: 23 cm Movement: Present     General:  Alert, oriented and cooperative. Patient is in no acute distress.  Skin: Skin is warm and dry. No rash noted.   Cardiovascular: Normal heart rate noted  Respiratory: Normal respiratory effort, no problems with respiration noted  Abdomen: Soft, gravid, appropriate for gestational age. Pain/Pressure: Present     Pelvic:  Cervical exam deferred        Extremities: Normal range of motion.  Edema: None  Mental Status: Normal mood and affect. Normal behavior. Normal judgment and thought content.   Urinalysis: Urine Protein: Negative Urine Glucose: 1+ U/s on 8/14 shows EFW1 lb 4 oz (41%), normal fluid, beech Assessment and Plan:  Pregnancy: G4P3003 at [redacted]w[redacted]d  1. Preexisting hypertension complicating pregnancy, antepartum, second trimester BP is well controlled Continue labetalol and ASA Baby is appropriately grown and f/u is scheduled at 28 wks  2. Supervision of high risk pregnancy, antepartum, second  trimester Continue prenatal care. 28 wk labs next visit  Preterm labor symptoms and general obstetric precautions including but not limited to vaginal bleeding, contractions, leaking of fluid and fetal movement were reviewed in detail with the patient. Please refer to After Visit Summary for other counseling recommendations.  Return in 4 weeks (on 12/21/2015).   Reva Boresanya S Alanson Hausmann, MD

## 2015-11-23 NOTE — Patient Instructions (Signed)
Segundo trimestre de Media planner (Second Trimester of Pregnancy) El segundo trimestre va desde la semana13 hasta la 48, desde el cuarto hasta el sexto mes, y suele ser el momento en el que mejor se siente. Su organismo se ha adaptado a Public relations account executive y comienza a Print production planner. En general, las nuseas matutinas han disminuido o han desaparecido completamente, puede tener ms energa y un aumento de apetito. El segundo trimestre es tambin la poca en la que el feto se desarrolla rpidamente. Hacia el final del sexto mes, el feto mide aproximadamente 9pulgadas (23cm) y pesa alrededor de 1 libras (700g). Es probable que sienta que el beb se Software engineer (da pataditas) entre las 108 y 20semanas del Media planner. CAMBIOS EN EL ORGANISMO Su organismo atraviesa por muchos cambios durante el [place], y estos varan de Ardelia Mems mujer a Theatre manager.   Seguir [employer]. Notar que la parte baja del abdomen sobresale.  Podrn aparecer las primeras Apache Corporation caderas, el abdomen y las [place].  Es posible que tenga dolores de cabeza que pueden aliviarse con los medicamentos que el mdico le permita tomar.  Tal vez tenga necesidad de orinar con ms frecuencia porque el feto est ejerciendo presin Field seismologist.  Debido al Glennis Brink podr sentir Victorio Palm estomacal con frecuencia.  Puede estar estreida, ya que ciertas hormonas enlentecen los movimientos de los msculos que JPMorgan Chase & Co desechos a travs de los intestinos.  Pueden aparecer hemorroides o abultarse e hincharse las venas (venas varicosas).  Puede tener dolor de espalda que se debe al Southern Company de peso y a que las hormonas del Scientist, research (life sciences) las articulaciones entre los huesos de la pelvis, y Civil Service fast streamer consecuencia de la modificacin del peso y los msculos que mantienen el equilibrio.  Las Lincoln National Corporation seguirn creciendo y Teaching laboratory technician.  Las Production manager y estar sensibles al cepillado y al hilo dental.  Pueden aparecer zonas oscuras o  manchas (cloasma, mscara del Media planner) en el rostro que probablemente se atenuar despus del nacimiento del beb.  Es posible que se forme una lnea oscura desde el ombligo hasta la zona del pubis (linea nigra) que probablemente se atenuar despus del nacimiento del beb.  Tal vez haya cambios en el cabello que pueden incluir su engrosamiento, crecimiento rpido y cambios en la textura. Adems, a algunas mujeres se les cae el cabello durante o despus del embarazo, o tienen el cabello seco o fino. Lo ms probable es que el cabello se le normalice despus del nacimiento del beb. QU DEBE ESPERAR EN LAS CONSULTAS PRENATALES Durante una visita prenatal de rutina:  La pesarn para asegurarse de que usted y el feto estn creciendo normalmente.  Le tomarn la presin arterial.  Le medirn el abdomen para controlar el desarrollo del beb.  Se escucharn los latidos cardacos fetales.  Se evaluarn los resultados de los estudios solicitados en visitas anteriores. El mdico puede preguntarle lo siguiente:  Cmo se siente.  Si siente los movimientos del beb.  Si ha tenido sntomas anormales, como prdida de lquido, [place], dolores de cabeza intensos o clicos abdominales.  Si est consumiendo algn producto que contenga tabaco, como cigarrillos, tabaco de Higher education careers adviser y Psychologist, sport and exercise.  Si tiene Sunoco. Otros estudios que podrn realizarse durante el segundo trimestre incluyen lo siguiente:  Anlisis de sangre para detectar lo siguiente:  Concentraciones de hierro bajas (anemia).  Diabetes gestacional (entre la semana 24 y la 22).  Anticuerpos Rh.  Anlisis de orina para detectar infecciones, diabetes o protenas en la  orina.  Una ecografa para confirmar que el beb crece y se desarrolla correctamente.  Una amniocentesis para diagnosticar posibles problemas genticos.  Estudios del feto para descartar espina bfida y sndrome de Down.  Prueba del VIH (virus  de inmunodeficiencia humana). Los exmenes prenatales de rutina incluyen la prueba de deteccin del VIH, a menos que decida no realizrsela. INSTRUCCIONES PARA EL CUIDADO EN EL HOGAR   Evite fumar, consumir hierbas, beber alcohol y tomar frmacos que no le hayan recetado. Estas sustancias qumicas afectan la formacin y el desarrollo del beb.  No consuma ningn producto que contenga tabaco, lo que incluye cigarrillos, tabaco de mascar y cigarrillos electrnicos. Si necesita ayuda para dejar de fumar, consulte al mdico. Puede recibir asesoramiento y otro tipo de recursos para dejar de fumar.  Siga las indicaciones del mdico en relacin con el uso de medicamentos. Durante el embarazo, hay medicamentos que son seguros de tomar y otros que no.  Haga ejercicio solamente como se lo haya indicado el mdico. Sentir clicos uterinos es un buen signo para detener la actividad fsica.  Contine comiendo alimentos sanos con regularidad.  Use un sostn que le brinde buen soporte si le duelen las mamas.  No se d baos de inmersin en agua caliente, baos turcos ni saunas.  Use el cinturn de seguridad en todo momento mientras conduce.  No coma carne cruda ni queso sin cocinar; evite el contacto con las bandejas sanitarias de los gatos y la tierra que estos animales usan. Estos elementos contienen grmenes que pueden causar defectos congnitos en el beb.  Tome las vitaminas prenatales.  Tome entre 1500 y 2000mg de calcio diariamente comenzando en la semana20 del embarazo hasta el parto.  Si est estreida, pruebe un laxante suave (si el mdico lo autoriza). Consuma ms alimentos ricos en fibra, como vegetales y frutas frescos y cereales integrales. Beba gran cantidad de lquido para mantener la orina de tono claro o color amarillo plido.  Dese baos de asiento con agua tibia para aliviar el dolor o las molestias causadas por las hemorroides. Use una crema para las hemorroides si el mdico la  autoriza.  Si tiene venas varicosas, use medias de descanso. Eleve los pies durante 15minutos, 3 o 4veces por da. Limite el consumo de sal en su dieta.  No levante objetos pesados, use zapatos de tacones bajos y mantenga una buena postura.  Descanse con las piernas elevadas si tiene calambres o dolor de cintura.  Visite a su dentista si an no lo ha hecho durante el embarazo. Use un cepillo de dientes blando para higienizarse los dientes y psese el hilo dental con suavidad.  Puede seguir manteniendo relaciones sexuales, a menos que el mdico le indique lo contrario.  Concurra a todas las visitas prenatales segn las indicaciones de su mdico. SOLICITE ATENCIN MDICA SI:   Tiene mareos.  Siente clicos leves, presin en la pelvis o dolor persistente en el abdomen.  Tiene nuseas, vmitos o diarrea persistentes.  Observa una secrecin vaginal con mal olor.  Siente dolor al orinar. SOLICITE ATENCIN MDICA DE INMEDIATO SI:   Tiene fiebre.  Tiene una prdida de lquido por la vagina.  Tiene sangrado o pequeas prdidas vaginales.  Siente dolor intenso o clicos en el abdomen.  Sube o baja de peso rpidamente.  Tiene dificultad para respirar y siente dolor de pecho.  Sbitamente se le hinchan mucho el rostro, las manos, los tobillos, los pies o las piernas.  No ha sentido los movimientos del beb durante   Georgianne Fickuna hora.  Siente un dolor de cabeza intenso que no se alivia con medicamentos.  Su visin se modifica.   Esta informacin no tiene Theme park managercomo fin reemplazar el consejo del mdico. Asegrese de hacerle al mdico cualquier pregunta que tenga.   Document Released: 01/02/2005 Document Revised: 04/15/2014 Elsevier Interactive Patient Education 2016 Arvin[employer]itorElsevier Inc.   RollaLactancia materna (Breastfeeding) Decidir Museum/gallery exhibitions officeramamantar es una de las mejores elecciones que puede hacer por usted y su beb. El cambio hormonal durante el Psychiatristembarazo produce el desarrollo del tejido mamario y Lesothoaumenta  la cantidad y el tamao de los conductos galactforos. Estas hormonas tambin permiten que las protenas, los azcares y las grasas de la sangre produzcan la WPS Resourcesleche materna en las glndulas productoras de Garlandleche. Las hormonas impiden que la leche materna sea liberada antes del nacimiento del beb, adems de impulsar el flujo de leche luego del nacimiento. Una vez que ha comenzado a Museum/gallery exhibitions officeramamantar, Conservation officer, naturepensar en el beb, as Immunologistcomo la succin o Theatre managerel llanto, pueden estimular la liberacin de Bellerive Acresleche de las glndulas productoras de Waukenaleche.  LOS BENEFICIOS DE AMAMANTAR Para el beb  La primera leche (calostro) ayuda a Careers information officermejorar el funcionamiento del sistema digestivo del beb.  La leche tiene anticuerpos que ayudan a Radio producerprevenir las infecciones en el beb.  El beb tiene una menor incidencia de asma, alergias y del sndrome de muerte sbita del lactante.  Los nutrientes en la Santa Rosaleche materna son mejores para el beb que la Baileyleche maternizada y estn preparados exclusivamente para cubrir las necesidades del beb.  La leche materna mejora el desarrollo cerebral del beb.  Es menos probable que el beb desarrolle otras enfermedades, como obesidad infantil, asma o diabetes mellitus de tipo 2. Para usted   La lactancia materna favorece el desarrollo de un vnculo muy especial entre la madre y el beb.  Es conveniente. La leche materna siempre est disponible a la Human resources officertemperatura correcta y es East Falmoutheconmica.  La lactancia materna ayuda a quemar caloras y a perder el peso ganado durante el Ringgoldembarazo.  Favorece la contraccin del tero al tamao que tena antes del embarazo de manera ms rpida y disminuye el sangrado (loquios) despus del parto.  La lactancia materna contribuye a reducir Nurse, adultel riesgo de desarrollar diabetes mellitus de tipo 2, osteoporosis o cncer de mama o de ovario en el futuro. SIGNOS DE QUE EL BEB EST HAMBRIENTO Primeros signos de [address]hambre  Aumenta su estado de Lesothoalerta o actividad.  Se estira.  Mueve la cabeza  de un lado a otro.  Mueve la cabeza y abre la boca cuando se le toca la mejilla o la comisura de la boca (reflejo de bsqueda).  Aumenta las vocalizaciones, tales como sonidos de succin, se relame los labios, emite arrullos, suspiros, o chirridos.  Mueve la Jones Apparel Groupmano hacia la boca.  Se chupa con ganas los dedos o las manos. Signos tardos de Fisher Scientifichambre  Est agitado.  Llora de manera intermitente. Signos de AES Corporationhambre extrema Los signos de hambre extrema requerirn que lo calme y lo consuele antes de que el beb pueda alimentarse adecuadamente. No espere a que se manifiesten los siguientes signos de hambre extrema para comenzar a Museum/gallery exhibitions officeramamantar:   Designer, jewelleryAgitacin.  Llanto intenso y fuerte.  Gritos. INFORMACIN BSICA SOBRE LA LACTANCIA MATERNA Iniciacin de la lactancia materna  Encuentre un lugar cmodo para sentarse o acostarse, con un buen respaldo para el cuello y la espalda.  Coloque una almohada o una manta enrollada debajo del beb para acomodarlo a la altura de la mama (si  est sentada). Las almohadas para amamantar se han diseado especialmente a fin de servir de apoyo para los braMuseum/gallery exhibitions officerel beb [employer] Foods.  Asegrese de que el abdomen del beb est frente al suyo.   Masajee suavemente la mama. Con las yemas de los dedos, masajee la pared del pecho hacia el pezn en un movimiento circular. Esto estimula el flujo de [place]. Es posible que Engineer, manufacturing systems este movimiento mientras amamanta si la leche fluye lentamente.  Sostenga la mama con el pulgar por arriba del pezn y los otros 4 dedos por debajo de la mama. Asegrese de que los dedos se encuentren lejos del pezn y de la boca del beb.  Empuje suavemente los labios del beb con el pezn o con el dedo.  Cuando la boca del beb se abra lo suficiente, acrquelo rpidamente a la mama e introduzca todo el pezn y la zona oscura que lo rodea (areola), tanto como sea posible, dentro de la boca del beb.  Debe haber ms areola visible por  arriba del labio superior del beb que por debajo del labio inferior.  La lengua del beb debe estar entre la enca inferior y la [place].  Asegrese de que la boca del beb est en la posicin correcta alrededor del pezn (prendida). Los labios del beb deben crear un sello sobre la mama y estar doblados hacia afuera (invertidos).  Es comn que el beb succione durante 2 a 3 minutos para que comience el flujo de [place]. Cmo debe prenderse Es muy importante que le ensee al beb cmo prenderse adecuadamente a la mama. Si el beb no se prende adecuadamente, puede causarle dolor en el pezn y reducir la produccin de [place], y hacer que el beb tenga un escaso aumento de [place]. Adems, si el beb no se prende adecuadamente al pezn, puede tragar aire durante la alimentacin. Esto puede causarle molestias al beb. Hacer eructar al beb al Pilar Plate de mama puede ayudarlo a liberar el aire. Sin embargo, ensearle al beb cmo prenderse a la mama adecuadamente es la mejor manera de evitar que se sienta molesto por tragar Oceanographer se alimenta. Signos de que el beb se ha prendido adecuadamente al pezn:   Payton Doughty o succiona de modo silencioso, sin causarle dolor.  Se escucha que traga cada 3 o 4 succiones.  Hay movimientos musculares por arriba y por delante de sus odos al Printmaker. Signos de que el beb no se ha prendido Audiological scientist al pezn:   Hace ruidos de succin o de chasquido mientras se alimenta.  Siente dolor en el pezn. Si cree que el beb no se prendi correctamente, deslice el dedo en la comisura de la boca y Ameren Corporation las encas del beb para interrumpir la succin. Intente comenzar a amamantar nuevamente. Signos de Fish farm manager Signos del beb:   Disminuye gradualmente el nmero de succiones o cesa la succin por completo.  Se duerme.  Relaja el cuerpo.  Retiene una pequea cantidad de [facility] boca.  Se desprende solo del  pecho. Signos que presenta usted:  Las mamas han aumentado la firmeza, el peso y el tamao 1 a 3 horas despus de Museum/gallery exhibitions officer.  Estn ms blandas inmediatamente despus de amamantar.  Un aumento del volumen de [place], y tambin un cambio en su consistencia y color se producen hacia el quinto da de Tour manager.  Los pezones no duelen, ni estn agrietados ni sangran. Signos de que su beb recibe la cantidad de [place]  suficiente  Moja al menos 3 paales en 24 horas. La orina debe ser clara y de color amarillo plido a los [address]  [address] de [place]vida.  Defeca al menos 3 veces en 24 horas a los [address]  [address] de [address]. La materia fecal debe ser blanda y Cove Neckamarillenta.  Defeca al menos 3 veces en 24 horas a los [address] das de [address]. La materia fecal debe ser grumosa y Neskowinamarillenta.  No registra una prdida de peso mayor del 10% del peso al nacer durante los primeros 3 [address]  [address] de [place]vida.  Aumenta de peso un promedio de 4 a 7onzas (113 a 198g) por semana despus de los 4 [address]  [address] de vida.  Aumenta de Johnson Prairiepeso, Havendiariamente, de Pikes Creekmanera uniforme a Glass blower/designerpartir de los 5 [address]  [address] de vida, sin Passenger transport managerregistrar prdida de peso despus de las 2semanas de vida. Despus de alimentarse, es posible que el beb regurgite una pequea cantidad. Esto es frecuente. FRECUENCIA Y DURACIN DE LA LACTANCIA MATERNA El amamantamiento frecuente la ayudar a producir ms Azerbaijanleche y a Education officer, communityprevenir problemas de Engineer, miningdolor en los pezones e hinchazn en las Iowa Parkmamas. Alimente al beb cuando muestre signos de hambre o si siente la necesidad de reducir la congestin de las Tylertownmamas. Esto se denomina "lactancia a demanda". Evite el uso del chupete mientras trabaja para establecer la lactancia (las primeras 4 a 6 semanas despus del nacimiento del beb). Despus de este perodo, podr ofrecerle un chupete. Las investigaciones demostraron que el uso del chupete durante el primer ao de vida del beb disminuye el riesgo de desarrollar el sndrome de muerte sbita del lactante (SMSL). Permita que el nio  se alimente en cada mama todo lo que desee. Contine amamantando al beb hasta que haya terminado de alimentarse. Cuando el beb se desprende o se queda dormido mientras se est alimentando de la primera mama, ofrzcale la segunda. Debido a que, con frecuencia, los recin Sunoconacidos permanecen somnolientos las primeras semanas de vida, es posible que deba despertar al beb para alimentarlo. Los horarios de Acupuncturistlactancia varan de un beb a otro. Sin embargo, las siguientes reglas pueden servir como gua para ayudarla a Lawyergarantizar que el beb se alimenta adecuadamente:  Se puede amamantar a los recin nacidos (bebs de 4 semanas o menos de vida) cada 1 a 3 horas.  No deben transcurrir ms de 3 horas durante el da o 5 horas durante la noche sin que se amamante a los recin nacidos.  Debe amamantar al beb 8 veces como mnimo en un perodo de 24 horas, hasta que comience a introducir slidos en su dieta, a los 6 meses de vida aproximadamente. EXTRACCIN DE Dean Foods CompanyLECHE MATERNA La extraccin y Contractorel almacenamiento de la leche materna le permiten asegurarse de que el beb se alimente exclusivamente de Spencerleche materna, aun en momentos en los que no puede amamantar. Esto tiene especial importancia si debe regresar al Aleen Campitrabajo en el perodo en que an est amamantando o si no puede estar presente en los momentos en que el beb debe alimentarse. Su asesor en lactancia puede orientarla sobre cunto tiempo es seguro almacenar Wolseyleche materna.  El sacaleche es un aparato que le permite extraer leche de la mama a un recipiente estril. Luego, la leche materna extrada puede almacenarse en un refrigerador o Electrical engineercongelador. Algunos sacaleches son Birdie Riddlemanuales, Delaney Meigsmientras que otros son elctricos. Consulte a su asesor en lactancia qu tipo ser ms conveniente para usted. Los sacaleches se pueden comprar; sin embargo, algunos hospitales y grupos de apoyo a la lactancia materna alquilan Sports coachsacaleches mensualmente. Un  asesor en lactancia puede ensearle  cmo extraer W. [employer]kley, en caso de que prefiera no usar un sacaleche.  CMO CUIDAR LAS MAMAS DURANTE LA LACTANCIA MATERNA Los pezones se secan, agrietan y duelen durante la Tour manager. Las siguientes recomendaciones pueden ayudarla a Pharmacologist las TEPPCO Partners y sanas:  Careers information officer usar jabn en los pezones.  Use un sostn de soporte. Aunque no son esenciales, las camisetas sin mangas o los sostenes especiales para Museum/gallery exhibitions officer estn diseados para acceder fcilmente a las mamas, para Museum/gallery exhibitions officer sin tener que quitarse todo el sostn o la camiseta. Evite usar sostenes con aro o sostenes muy ajustados.  Seque al aire sus pezones durante 3 a despus de amamantar al beb.  Utilice solo apsitos de Haematologist sostn para Environmental health practitioner las prdidas de [place]. La prdida de un poco de Public Service Enterprise Group tomas es normal.  Utilice lanolina sobre los pezones luego de Museum/gallery exhibitions officer. La lanolina ayuda a mantener la humedad normal de la piel. Si [place] lanolina pura, no tiene que lavarse los pezones antes de volver a Corporate treasurer al beb. La lanolina pura no es txica para el beb. Adems, puede extraer Beazer Homes algunas gotas de [place] materna y Engineer, maintenance (IT) suavemente esa Winn[employer], para que la Chance se seque al aire. Durante las primeras semanas despus de dar a luz, algunas mujeres pueden experimentar hinchazn en las mamas (congestin [place]). La congestin puede hacer que sienta las mamas pesadas, calientes y sensibles al tacto. El pico de la congestin ocurre dentro de los 3 a 5 das despus del [place]. Las siguientes recomendaciones pueden ayudarla a Paramedic la congestin:  Vace por completo las mamas al QUALCOMM o Environmental health practitioner. Puede aplicar calor hmedo en las mamas (en la ducha o con toallas hmedas para manos) antes de Museum/gallery exhibitions officer o extraer WPS Resources. Esto aumenta la circulacin y [place] a que la [place]. Si el beb no vaca por completo las [address] cuando lo [address],  extraiga la [place] restante despus de que haya finalizado.  Use un sostn ajustado (para amamantar o comn) o una camiseta sin mangas durante 1 o 2 das para indicar al cuerpo que disminuya ligeramente la produccin de [place].  Aplique compresas de hielo Yahoo! Inc, a menos que le resulte demasiado incmodo.  Asegrese de que el beb est prendido y se encuentre en la posicin correcta mientras lo alimenta. Si la congestin persiste luego de 48 horas o despus de seguir estas recomendaciones, comunquese con su mdico o un Holiday representative. RECOMENDACIONES GENERALES PARA EL CUIDADO DE LA SALUD DURANTE LA LACTANCIA MATERNA  Consuma alimentos saludables. Alterne comidas y colaciones, y coma 3 de cada una por da. Dado que lo que come Danaher Corporation, es posible que algunas comidas hagan que su beb se vuelva ms irritable de lo habitual. Evite comer este tipo de alimentos si percibe que afectan de manera negativa al beb.  Beba leche, jugos de fruta y agua para Patent examiner su sed (aproximadamente 10 vasos al Futures trader).  Descanse con frecuencia, reljese y tome sus vitaminas prenatales para evitar la fatiga, el estrs y la anemia.  Contine con los autocontroles de la mama.  Evite Product manager y fumar tabaco. Las sustancias qumicas de los cigarrillos que pasan a la leche materna y la exposicin al humo ambiental del tabaco pueden daar al beb.  No consuma alcohol ni drogas, incluida la marihuana. Algunos medicamentos, que pueden ser perjudiciales para el beb, pueden pasar a travs de la Colgate Palmolive.  Es importante que consulte a su mdico antes de Medical sales representativetomar cualquier medicamento, incluidos todos los medicamentos recetados y de Alamoventa libre, as como los suplementos vitamnicos y herbales. Puede quedar embarazada durante la lactancia. Si desea controlar la natalidad, consulte a su mdico cules son las opciones ms seguras para el beb. SOLICITE ATENCIN MDICA SI:   Usted siente que quiere  dejar de Museum/gallery exhibitions officeramamantar o se siente frustrada con la lactancia.  Siente dolor en las mamas o en los pezones.  Sus pezones estn agrietados o Water quality scientistsangran.  Sus pechos estn irritados, sensibles o calientes.  Tiene un rea hinchada en cualquiera de las mamas.  Siente escalofros o fiebre.  Tiene nuseas o vmitos.  Presenta una secrecin de otro lquido distinto de la leche materna de los pezones.  Sus mamas no se llenan antes de Museum/gallery exhibitions officeramamantar al beb para el quinto da despus del Ravennaparto.  Se siente triste y deprimida.  El beb est demasiado somnoliento como para comer bien.  El beb tiene problemas para dormir.  Moja menos de 3 paales en 24 horas.  Defeca menos de 3 veces en 24 horas.  La piel del beb o la parte blanca de los ojos se vuelven amarillentas.  El beb no ha aumentado de Shoreacrespeso a los [address] das de [place]ida. SOLICITE ATENCIN MDICA DE INMEDIATO SI:   El beb est muy cansado Retail buyer(letargo) y no se quiere despertar para comer.  Le sube la fiebre sin causa.   Esta informacin no tiene Theme park managercomo fin reemplazar el consejo del mdico. Asegrese de hacerle al mdico cualquier pregunta que tenga.   Document Released: 03/25/2005 Document Revised: 12/14/2014 Elsevier Interactive Patient Education Yahoo! Inc2016 Elsevier Inc.

## 2015-11-23 NOTE — Progress Notes (Signed)
U/s on 8/14 shows EFW1 lb 4 oz (41%), normal fluid, beech

## 2015-12-18 ENCOUNTER — Ambulatory Visit (INDEPENDENT_AMBULATORY_CARE_PROVIDER_SITE_OTHER): Payer: Self-pay | Admitting: Obstetrics and Gynecology

## 2015-12-18 ENCOUNTER — Ambulatory Visit (HOSPITAL_COMMUNITY)
Admission: RE | Admit: 2015-12-18 | Discharge: 2015-12-18 | Disposition: A | Discharge status: Home / Self Care | Payer: Self-pay | Source: Ambulatory Visit | Attending: Nurse Practitioner | Admitting: Nurse Practitioner

## 2015-12-18 ENCOUNTER — Other Ambulatory Visit (HOSPITAL_COMMUNITY): Payer: Self-pay | Admitting: Maternal and Fetal Medicine

## 2015-12-18 ENCOUNTER — Other Ambulatory Visit (HOSPITAL_COMMUNITY): Payer: Self-pay | Admitting: *Deleted

## 2015-12-18 ENCOUNTER — Encounter (HOSPITAL_COMMUNITY): Payer: Self-pay

## 2015-12-18 VITALS — BP 123/83 | HR 65 | Wt 137.0 lb

## 2015-12-18 DIAGNOSIS — Z3A27 27 weeks gestation of pregnancy: Secondary | ICD-10-CM

## 2015-12-18 DIAGNOSIS — O10912 Unspecified pre-existing hypertension complicating pregnancy, second trimester: Secondary | ICD-10-CM

## 2015-12-18 DIAGNOSIS — O10919 Unspecified pre-existing hypertension complicating pregnancy, unspecified trimester: Secondary | ICD-10-CM

## 2015-12-18 DIAGNOSIS — Z23 Encounter for immunization: Secondary | ICD-10-CM

## 2015-12-18 DIAGNOSIS — O0992 Supervision of high risk pregnancy, unspecified, second trimester: Secondary | ICD-10-CM

## 2015-12-18 LAB — CBC
HCT: 35.3 % (ref 35.0–45.0)
HEMOGLOBIN: 11.4 g/dL — AB (ref 11.7–15.5)
MCH: 25.9 pg — AB (ref 27.0–33.0)
MCHC: 32.3 g/dL (ref 32.0–36.0)
MCV: 80.2 fL (ref 80.0–100.0)
MPV: 11.1 fL (ref 7.5–12.5)
PLATELETS: 199 10*3/uL (ref 140–400)
RBC: 4.4 MIL/uL (ref 3.80–5.10)
RDW: 15.2 % — AB (ref 11.0–15.0)
WBC: 8.4 10*3/uL (ref 3.8–10.8)

## 2015-12-18 LAB — POCT URINALYSIS DIP (DEVICE)
BILIRUBIN URINE: NEGATIVE
Glucose, UA: 250 mg/dL — AB
HGB URINE DIPSTICK: NEGATIVE
Ketones, ur: NEGATIVE mg/dL
NITRITE: NEGATIVE
PH: 5.5 (ref 5.0–8.0)
Protein, ur: 100 mg/dL — AB
Specific Gravity, Urine: 1.025 (ref 1.005–1.030)
UROBILINOGEN UA: 0.2 mg/dL (ref 0.0–1.0)

## 2015-12-18 MED ORDER — TETANUS-DIPHTH-ACELL PERTUSSIS 5-2.5-18.5 LF-MCG/0.5 IM SUSP
0.5000 mL | Freq: Once | INTRAMUSCULAR | Status: AC
  Administered 2015-12-18: 0.5 mL via INTRAMUSCULAR

## 2015-12-18 NOTE — Progress Notes (Signed)
   PRENATAL VISIT NOTE  Subjective:  Gabrielle Park is a 35 y.o. G4P3003 at 6673w4d being seen today for ongoing prenatal care.  She is currently monitored for the following issues for this high-risk pregnancy and has Oligomenorrhea; DUB (dysfunctional uterine bleeding); Pap smear abnormality of cervix with LGSIL; Supervision of high risk pregnancy, antepartum; and Chronic hypertension complicating pregnancy, antepartum on her problem list.  Patient reports no complaints.  Contractions: Not present. Vag. Bleeding: None.  Movement: Present. Denies leaking of fluid.   The following portions of the patient's history were reviewed and updated as appropriate: allergies, current medications, past family history, past medical history, past social history, past surgical history and problem list. Problem list updated.  Objective:   Vitals:   12/18/15 1142  BP: 123/83  Pulse: 65  Weight: 137 lb (62.1 kg)    Fetal Status: Fetal Heart Rate (bpm): 134   Movement: Present     General:  Alert, oriented and cooperative. Patient is in no acute distress.  Skin: Skin is warm and dry. No rash noted.   Cardiovascular: Normal heart rate noted  Respiratory: Normal respiratory effort, no problems with respiration noted  Abdomen: Soft, gravid, appropriate for gestational age. Pain/Pressure: Absent     Pelvic:  Cervical exam deferred        Extremities: Normal range of motion.  Edema: None  Mental Status: Normal mood and affect. Normal behavior. Normal judgment and thought content.   Urinalysis: Urine Protein: 2+ Urine Glucose: 2+  Assessment and Plan:  Pregnancy: G4P3003 at 4573w4d  1. Chronic hypertension in pregnancy, second trimester Pt with crhonic hypertension. She is on labetolol 200mg  BID and has had good control. Her base line pr/cr is wnl.   She has not had proteinuria until today. We will have pt come back weekly for BP check with RN and biweekly for visit with provider. If persistent could  consider 24 hour urine versus repeat Pr/Cr.  D/W Dr. Vergie LivingPickens.   2. Supervision of high risk pregnancy, antepartum, second trimester - CBC - RPR - HIV antibody (with reflex) - Glucose Tolerance, 1 HR (50g) w/o Fasting - Tdap (BOOSTRIX) injection 0.5 mL; Inject 0.5 mLs into the muscle once. - Flu Vaccine QUAD 36+ mos IM (Fluarix, Quad PF)   Preterm labor symptoms and general obstetric precautions including but not limited to vaginal bleeding, contractions, leaking of fluid and fetal movement were reviewed in detail with the patient. Please refer to After Visit Summary for other counseling recommendations.  No Follow-up on file.  Lorne SkeensNicholas Michael Ermie Glendenning, MD

## 2015-12-18 NOTE — Patient Instructions (Signed)
Please call if you blood pressure is greater than 140/100 or if you develop severe headache or swelling.  Preeclampsia y eclampsia (Preeclampsia and Eclampsia) La preeclampsia es una afeccin grave que solo se manifiesta durante el [place]. Tambin se la conoce como toxemia del Psychiatrist. Esta afeccin provoca el aumento de la presin arterial junto con otros sntomas, como hinchazn y dolores de [place], que pueden aparecer a medida que la preeclampsia empeora. La preeclampsia puede presentarse a las 20semanas de gestacin o posteriormente.  El diagnstico y el tratamiento tempranos de esta afeccin son muy importantes. Si no se la trata de inmediato, puede causarles problemas graves a usted y al beb. Una complicacin que la preeclampsia puede provocar es la eclampsia, una afeccin que causa temblores o contracciones musculares (convulsiones) en la [place]. Provocar el parto del beb es el mejor tratamiento para la preeclampsia o la eclampsia.  FACTORES DE RIESGO La causa de la preeclampsia no se conoce. Puede tener ms probabilidades de desarrollar la afeccin si tiene ciertos factores de Chief of Staff. Estos incluyen:   Estar embarazada por primera vez.  Haber tenido preeclampsia en un embarazo anterior.  Tener antecedentes familiares de preeclampsia.  Tener presin arterial alta.  Estar embarazada de gemelos o trillizos.  Tener 35aos o ms.  Pertenecer a Engineer, production.  Tener enfermedad renal o diabetes.  Tener enfermedades, como lupus o enfermedades de la [place].  Tener mucho sobrepeso (obesidad). SIGNOS Y SNTOMAS  Los primeros signos de preeclampsia son:  Hipertensin arterial.  Aumento de las protenas en la orina. El Facilities manager en cada visita prenatal. Otros sntomas que pueden presentarse incluyen:   Dolor de cabeza intenso  Aumento repentino de peso.  Hinchazn de las manos, el rostro, las piernas y [employer].  Malestar estomacal (nuseas) y  (vmitos).  Problemas visuales (visin doble o borrosa).  Adormecimiento del rostro, los Seltzer, las piernas y los pies.  Mareos.  Hablar arrastrando las palabras.  Sensibilidad a las luces brillantes.  Dolor abdominal. DIAGNSTICO  No hay pruebas diagnsticas para la preeclampsia. El mdico le har preguntas sobre los sntomas y verificar la presencia de signos de preeclampsia durante las visitas prenatales. Tambin pueden hacerle exmenes que incluyen:  Anlisis de [place].  Anlisis de [place].  Control de la frecuencia cardaca del beb.  Control de la salud del beb y de la placenta mediante el uso de imgenes que se generan con ondas sonoras (ecografa). TRATAMIENTO  Puede planificar el mejor abordaje de tratamiento junto con el mdico. Es muy importante que concurra a todas las visitas prenatales. Si tiene un riesgo ms alto de tener preeclampsia, tal vez necesite exmenes prenatales con ms frecuencia.  El mdico tambin puede indicarle que haga reposo en cama.  Tal vez deba comer la menor cantidad de sal posible.  Es posible que tenga que tomar medicamentos para bajar la presin arterial si la afeccin no responde a medidas ms conservadoras.  Quizs deba [employer] [facility] si la afeccin es grave. All, el tratamiento estar centrado en el control de la presin arterial y la retencin de lquidos. Puede que tambin tenga que tomar medicamentos para evitar las convulsiones.  Si la afeccin empeora, tal vez sea necesario adelantar el parto para protegerlos a usted y al beb. Pueden provocarle el trabajo de parto con medicamentos (inducirse) o hacerle una cesrea.  Generalmente, la preeclampsia desaparece despus del parto. INSTRUCCIONES PARA EL CUIDADO EN EL HOGAR   Tome los medicamentos de venta libre o recetados solamente segn  las indicaciones del mdico.  Acustese sobre el lado izquierdo mientras hace reposo; de [place] modo, se quita la presin del  beb.  Levante los pies mientras hace reposo.  Realice actividad fsica con regularidad. Consulte al mdico qu tipo de ejercicio es seguro para usted.  Evite la cafena y el alcohol.  No fume.  Beba de 6 a 8vasos de Warehouse manageragua por da.  Coma una dieta equilibrada con bajo contenido de sal. No agregue sal a las comidas.  Evite las situaciones estresantes tanto como sea posible.  Descanse y duerma lo suficiente.  Concurra a todas las visitas prenatales y hgase todos los anlisis segn lo programado. SOLICITE ATENCIN MDICA SI:  Aumenta de peso ms de lo esperado.  Tiene dolores de cabeza, dolor abdominal o nuseas.  Aparecen ms hematomas que lo normal.  Se siente mareada o tiene vahdos. SOLICITE ATENCIN MDICA DE INMEDIATO SI:   Aparece una hinchazn repentina o tiene una zona muy hinchada en cualquier parte del cuerpo. Esto suele ocurrir en las piernas.  Tiene un aumento de peso de 5libras (2,3kg) o ms en una semana.  Tiene dolor de cabeza intenso, mareos, problemas visuales o confusin.  Siente un dolor abdominal intenso.  Tiene nuseas o vmitos permanentes.  Tiene convulsiones.  Tiene dificultad para mover cualquier parte del cuerpo.  Tiene adormecimiento en el cuerpo.  Tiene dificultad para hablar.  Tiene cualquier sangrado anormal.  Siente rigidez en el cuello.  Se desmaya. ASEGRESE DE QUE:   Comprende estas instrucciones.  Controlar su afeccin.  Recibir ayuda de inmediato si no mejora o si empeora.   Esta informacin no tiene Theme park managercomo fin reemplazar el consejo del mdico. Asegrese de hacerle al mdico cualquier pregunta que tenga.   Document Released: 01/02/2005 Document Revised: 03/30/2013 Elsevier Interactive Patient Education [employer]2016 Elsevier Inc.

## 2015-12-18 NOTE — Progress Notes (Signed)
Gabrielle Park used for interpreter for check in

## 2015-12-19 LAB — GLUCOSE TOLERANCE, 1 HOUR (50G) W/O FASTING: Glucose, 1 Hr, gestational: 141 mg/dL — ABNORMAL HIGH (ref <140)

## 2015-12-19 LAB — HIV ANTIBODY (ROUTINE TESTING W REFLEX): HIV: NONREACTIVE

## 2015-12-19 LAB — RPR

## 2015-12-20 ENCOUNTER — Telehealth: Payer: Self-pay | Admitting: Family Medicine

## 2015-12-20 ENCOUNTER — Telehealth: Payer: Self-pay | Admitting: *Deleted

## 2015-12-20 NOTE — Telephone Encounter (Signed)
[facility] Trula OreChristina # [phone] and left message stating that I am calling with test results and to discuss another appt which is needed. Please call back. *Pt had abnormal 1hr GTT and now needs 3hr GTT

## 2015-12-26 ENCOUNTER — Other Ambulatory Visit: Payer: Self-pay

## 2015-12-26 DIAGNOSIS — O99814 Abnormal glucose complicating childbirth: Secondary | ICD-10-CM

## 2015-12-27 LAB — GLUCOSE TOLERANCE, 3 HOURS
GLUCOSE, 2 HOUR-GESTATIONAL: 112 mg/dL (ref <165)
GLUCOSE, FASTING-GESTATIONAL: 78 mg/dL (ref 65–104)
Glucose Tolerance, 1 hour: 120 mg/dL (ref <190)
Glucose, GTT - 3 Hour: 104 mg/dL (ref <145)

## 2016-01-01 NOTE — Telephone Encounter (Signed)
Patient results have been given to her

## 2016-01-08 ENCOUNTER — Ambulatory Visit (INDEPENDENT_AMBULATORY_CARE_PROVIDER_SITE_OTHER): Payer: Self-pay | Admitting: Obstetrics and Gynecology

## 2016-01-08 VITALS — BP 136/86 | HR 56 | Wt 139.0 lb

## 2016-01-08 DIAGNOSIS — O0990 Supervision of high risk pregnancy, unspecified, unspecified trimester: Secondary | ICD-10-CM

## 2016-01-08 DIAGNOSIS — O10919 Unspecified pre-existing hypertension complicating pregnancy, unspecified trimester: Secondary | ICD-10-CM

## 2016-01-08 DIAGNOSIS — O99810 Abnormal glucose complicating pregnancy: Secondary | ICD-10-CM

## 2016-01-08 DIAGNOSIS — O10913 Unspecified pre-existing hypertension complicating pregnancy, third trimester: Secondary | ICD-10-CM

## 2016-01-08 LAB — POCT URINALYSIS DIP (DEVICE)
BILIRUBIN URINE: NEGATIVE
Glucose, UA: NEGATIVE mg/dL
Hgb urine dipstick: NEGATIVE
Ketones, ur: NEGATIVE mg/dL
NITRITE: NEGATIVE
Protein, ur: NEGATIVE mg/dL
Specific Gravity, Urine: 1.02 (ref 1.005–1.030)
Urobilinogen, UA: 0.2 mg/dL (ref 0.0–1.0)
pH: 6 (ref 5.0–8.0)

## 2016-01-08 NOTE — Progress Notes (Signed)
Subjective:  Gabrielle Park is a 35 y.o. G4P3003 at 3056w4d being seen today for ongoing prenatal care.  She is currently monitored for the following issues for this high-risk pregnancy and has Supervision of high risk pregnancy, antepartum; Chronic hypertension complicating pregnancy, antepartum; and Abnormal glucose affecting pregnancy on her problem list.  Patient reports no complaints.  Contractions: Not present. Vag. Bleeding: None.  Movement: Present. Denies leaking of fluid.   The following portions of the patient's history were reviewed and updated as appropriate: allergies, current medications, past family history, past medical history, past social history, past surgical history and problem list. Problem list updated.  Objective:   Vitals:   01/08/16 0947  BP: 136/86  Pulse: (!) 56  Weight: 63 kg (139 lb)    Fetal Status: Fetal Heart Rate (bpm): 138   Movement: Present     General:  Alert, oriented and cooperative. Patient is in no acute distress.  Skin: Skin is warm and dry. No rash noted.   Cardiovascular: Normal heart rate noted  Respiratory: Normal respiratory effort, no problems with respiration noted  Abdomen: Soft, gravid, appropriate for gestational age. Pain/Pressure: Present     Pelvic:  Cervical exam deferred        Extremities: Normal range of motion.  Edema: Trace  Mental Status: Normal mood and affect. Normal behavior. Normal judgment and thought content.   Urinalysis: Urine Protein: Negative Urine Glucose: Negative  Assessment and Plan:  Pregnancy: G4P3003 at 3056w4d  1. Supervision of high risk pregnancy, antepartum  2. Abnormal glucose affecting pregnancy Normal 3 hr GTT   3. Chronic hypertension complicating pregnancy, antepartum Stable on current regiment Growth U/S next week Start Antenatal testing twice weekly at 32 weeks - Protein / creatinine ratio, urine  Preterm labor symptoms and general obstetric precautions including but not limited  to vaginal bleeding, contractions, leaking of fluid and fetal movement were reviewed in detail with the patient. Please refer to After Visit Summary for other counseling recommendations.  Return in about 2 weeks (around 01/22/2016).   Hermina StaggersMichael L Dinora Hemm, MD

## 2016-01-08 NOTE — Progress Notes (Signed)
Used Interpreter Gabrielle Park. Informed her will start NST twice weekly in 2 weeks.

## 2016-01-09 LAB — PROTEIN / CREATININE RATIO, URINE
CREATININE, URINE: 79 mg/dL (ref 20–320)
PROTEIN CREATININE RATIO: 152 mg/g{creat} (ref 21–161)
TOTAL PROTEIN, URINE: 12 mg/dL (ref 5–24)

## 2016-01-15 ENCOUNTER — Ambulatory Visit (HOSPITAL_COMMUNITY)
Admission: RE | Admit: 2016-01-15 | Discharge: 2016-01-15 | Disposition: A | Discharge status: Home / Self Care | Payer: Self-pay | Source: Ambulatory Visit | Attending: Nurse Practitioner | Admitting: Nurse Practitioner

## 2016-01-15 ENCOUNTER — Other Ambulatory Visit (HOSPITAL_COMMUNITY): Payer: Self-pay | Admitting: Maternal and Fetal Medicine

## 2016-01-15 ENCOUNTER — Encounter (HOSPITAL_COMMUNITY): Payer: Self-pay

## 2016-01-15 DIAGNOSIS — O10919 Unspecified pre-existing hypertension complicating pregnancy, unspecified trimester: Secondary | ICD-10-CM

## 2016-01-15 DIAGNOSIS — Z3A31 31 weeks gestation of pregnancy: Secondary | ICD-10-CM | POA: Insufficient documentation

## 2016-01-15 DIAGNOSIS — O365930 Maternal care for other known or suspected poor fetal growth, third trimester, not applicable or unspecified: Secondary | ICD-10-CM

## 2016-01-15 DIAGNOSIS — O10013 Pre-existing essential hypertension complicating pregnancy, third trimester: Secondary | ICD-10-CM | POA: Insufficient documentation

## 2016-01-22 ENCOUNTER — Encounter (HOSPITAL_COMMUNITY): Payer: Self-pay

## 2016-01-22 ENCOUNTER — Encounter: Payer: Self-pay | Admitting: General Practice

## 2016-01-22 ENCOUNTER — Other Ambulatory Visit (HOSPITAL_COMMUNITY): Payer: Self-pay | Admitting: Maternal and Fetal Medicine

## 2016-01-22 ENCOUNTER — Ambulatory Visit (HOSPITAL_COMMUNITY)
Admission: RE | Admit: 2016-01-22 | Discharge: 2016-01-22 | Disposition: A | Discharge status: Home / Self Care | Payer: Self-pay | Source: Ambulatory Visit | Attending: Nurse Practitioner | Admitting: Nurse Practitioner

## 2016-01-22 ENCOUNTER — Ambulatory Visit (INDEPENDENT_AMBULATORY_CARE_PROVIDER_SITE_OTHER): Payer: Self-pay | Admitting: General Practice

## 2016-01-22 VITALS — BP 139/81 | HR 50

## 2016-01-22 DIAGNOSIS — O365930 Maternal care for other known or suspected poor fetal growth, third trimester, not applicable or unspecified: Secondary | ICD-10-CM

## 2016-01-22 DIAGNOSIS — Z3A32 32 weeks gestation of pregnancy: Secondary | ICD-10-CM | POA: Insufficient documentation

## 2016-01-22 DIAGNOSIS — O163 Unspecified maternal hypertension, third trimester: Secondary | ICD-10-CM

## 2016-01-22 DIAGNOSIS — O10913 Unspecified pre-existing hypertension complicating pregnancy, third trimester: Secondary | ICD-10-CM

## 2016-01-22 DIAGNOSIS — O10919 Unspecified pre-existing hypertension complicating pregnancy, unspecified trimester: Secondary | ICD-10-CM

## 2016-01-22 DIAGNOSIS — O0990 Supervision of high risk pregnancy, unspecified, unspecified trimester: Secondary | ICD-10-CM

## 2016-01-22 DIAGNOSIS — O10013 Pre-existing essential hypertension complicating pregnancy, third trimester: Secondary | ICD-10-CM | POA: Insufficient documentation

## 2016-01-22 NOTE — Progress Notes (Signed)
NST reactive.

## 2016-01-22 NOTE — Progress Notes (Signed)
Marley used for interpreter Patient to return Thursday for OB appt.

## 2016-01-25 ENCOUNTER — Ambulatory Visit (INDEPENDENT_AMBULATORY_CARE_PROVIDER_SITE_OTHER): Payer: Self-pay | Admitting: Obstetrics and Gynecology

## 2016-01-25 VITALS — BP 134/87 | HR 55 | Wt 143.8 lb

## 2016-01-25 DIAGNOSIS — O10913 Unspecified pre-existing hypertension complicating pregnancy, third trimester: Secondary | ICD-10-CM

## 2016-01-25 DIAGNOSIS — O0990 Supervision of high risk pregnancy, unspecified, unspecified trimester: Secondary | ICD-10-CM

## 2016-01-25 DIAGNOSIS — O99810 Abnormal glucose complicating pregnancy: Secondary | ICD-10-CM

## 2016-01-25 LAB — POCT URINALYSIS DIP (DEVICE)
Bilirubin Urine: NEGATIVE
Glucose, UA: NEGATIVE mg/dL
HGB URINE DIPSTICK: NEGATIVE
Ketones, ur: NEGATIVE mg/dL
NITRITE: NEGATIVE
PH: 5.5 (ref 5.0–8.0)
Protein, ur: NEGATIVE mg/dL
Specific Gravity, Urine: 1.025 (ref 1.005–1.030)
UROBILINOGEN UA: 0.2 mg/dL (ref 0.0–1.0)

## 2016-01-25 MED ORDER — VITAFOL GUMMIES 3.33-0.333-34.8 MG PO CHEW: 1.0000 | CHEWABLE_TABLET | Freq: Every day | ORAL | 3 refills | Status: DC

## 2016-01-25 NOTE — Progress Notes (Signed)
On 10/16 pt had BPP - 8/8 and R- NST so does not need NST today. Pt is already scheduled with MFM for weekly BPP's and UA dopplers.  She will have NST on those same days to complete fetal testing. Pt is not taking PNV's because they make her nauseous.

## 2016-01-25 NOTE — Progress Notes (Signed)
Subjective:  Gabrielle Park is a 35 y.o. G4P3003 at 7731w0d being seen today for ongoing prenatal care.  She is currently monitored for the following issues for this high-risk pregnancy and has Supervision of high risk pregnancy, antepartum; Chronic hypertension complicating pregnancy, antepartum; and Abnormal glucose affecting pregnancy on her problem list.  Patient reports no complaints.  Contractions: Not present. Vag. Bleeding: None.  Movement: Present. Denies leaking of fluid.   The following portions of the patient's history were reviewed and updated as appropriate: allergies, current medications, past family history, past medical history, past social history, past surgical history and problem list. Problem list updated.  Objective:   Vitals:   01/25/16 0803  BP: 134/87  Pulse: (!) 55  Weight: 143 lb 12.8 oz (65.2 kg)    Fetal Status: Fetal Heart Rate (bpm): 134 Fundal Height: 33 cm Movement: Present     General:  Alert, oriented and cooperative. Patient is in no acute distress.  Skin: Skin is warm and dry. No rash noted.   Cardiovascular: Normal heart rate noted  Respiratory: Normal respiratory effort, no problems with respiration noted  Abdomen: Soft, gravid, appropriate for gestational age. Pain/Pressure: Absent     Pelvic:  Cervical exam deferred        Extremities: Normal range of motion.  Edema: None  Mental Status: Normal mood and affect. Normal behavior. Normal judgment and thought content.   Urinalysis:      Assessment and Plan:  Pregnancy: G4P3003 at 5531w0d  1. Chronic hypertension complicating or reason for care during pregnancy, third trimester Stable Continue with Labetalol Continue with weekly antenatal testing with MFM  2. Supervision of high risk pregnancy, antepartum   3. Abnormal glucose affecting pregnancy Normal 3 hr GTT  Preterm labor symptoms and general obstetric precautions including but not limited to vaginal bleeding, contractions,  leaking of fluid and fetal movement were reviewed in detail with the patient. Please refer to After Visit Summary for other counseling recommendations.  Return in about 1 week (around 02/01/2016) for NST only - has US @ 0915.   Hermina StaggersMichael L Caden Fatica, MD

## 2016-01-29 ENCOUNTER — Ambulatory Visit (HOSPITAL_COMMUNITY)
Admission: RE | Admit: 2016-01-29 | Discharge: 2016-01-29 | Disposition: A | Discharge status: Home / Self Care | Payer: Self-pay | Source: Ambulatory Visit | Attending: Nurse Practitioner | Admitting: Nurse Practitioner

## 2016-01-29 ENCOUNTER — Encounter (HOSPITAL_COMMUNITY): Payer: Self-pay

## 2016-01-29 ENCOUNTER — Ambulatory Visit (INDEPENDENT_AMBULATORY_CARE_PROVIDER_SITE_OTHER): Payer: Self-pay | Admitting: *Deleted

## 2016-01-29 DIAGNOSIS — O10913 Unspecified pre-existing hypertension complicating pregnancy, third trimester: Secondary | ICD-10-CM

## 2016-01-29 DIAGNOSIS — O365930 Maternal care for other known or suspected poor fetal growth, third trimester, not applicable or unspecified: Secondary | ICD-10-CM | POA: Insufficient documentation

## 2016-01-29 DIAGNOSIS — Z3A33 33 weeks gestation of pregnancy: Secondary | ICD-10-CM | POA: Insufficient documentation

## 2016-01-29 DIAGNOSIS — O10013 Pre-existing essential hypertension complicating pregnancy, third trimester: Secondary | ICD-10-CM | POA: Insufficient documentation

## 2016-01-29 NOTE — Progress Notes (Signed)
NST reactive.

## 2016-02-01 ENCOUNTER — Ambulatory Visit (INDEPENDENT_AMBULATORY_CARE_PROVIDER_SITE_OTHER): Payer: Self-pay | Admitting: Obstetrics and Gynecology

## 2016-02-01 VITALS — BP 136/81 | HR 57 | Wt 145.4 lb

## 2016-02-01 DIAGNOSIS — R001 Bradycardia, unspecified: Secondary | ICD-10-CM

## 2016-02-01 DIAGNOSIS — O0990 Supervision of high risk pregnancy, unspecified, unspecified trimester: Secondary | ICD-10-CM

## 2016-02-01 DIAGNOSIS — O10913 Unspecified pre-existing hypertension complicating pregnancy, third trimester: Secondary | ICD-10-CM

## 2016-02-01 DIAGNOSIS — O99810 Abnormal glucose complicating pregnancy: Secondary | ICD-10-CM

## 2016-02-01 DIAGNOSIS — O10919 Unspecified pre-existing hypertension complicating pregnancy, unspecified trimester: Secondary | ICD-10-CM

## 2016-02-01 DIAGNOSIS — Z789 Other specified health status: Secondary | ICD-10-CM

## 2016-02-01 DIAGNOSIS — Z758 Other problems related to medical facilities and other health care: Secondary | ICD-10-CM | POA: Insufficient documentation

## 2016-02-01 LAB — POCT URINALYSIS DIP (DEVICE)
Bilirubin Urine: NEGATIVE
Glucose, UA: NEGATIVE mg/dL
HGB URINE DIPSTICK: NEGATIVE
Ketones, ur: NEGATIVE mg/dL
Leukocytes, UA: NEGATIVE
NITRITE: NEGATIVE
PH: 5.5 (ref 5.0–8.0)
PROTEIN: NEGATIVE mg/dL
Specific Gravity, Urine: 1.02 (ref 1.005–1.030)
Urobilinogen, UA: 0.2 mg/dL (ref 0.0–1.0)

## 2016-02-01 MED ORDER — LABETALOL HCL 200 MG PO TABS: 100.0000 mg | ORAL_TABLET | Freq: Two times a day (BID) | ORAL | 2 refills | Status: DC

## 2016-02-01 NOTE — Progress Notes (Signed)
Interpreter [employer]quel Mora present for encounter.  Pt reports having pain in both hands - discussed using wrist splints which may help. US for growth, BPP and UA doppler scheduled 10/30

## 2016-02-01 NOTE — Progress Notes (Addendum)
Prenatal Visit Note Date: 02/01/2016 [facility]  Subjective:  Devra Doppraceli Castro-Castro is a 35 y.o. G4P3003 at 4062w0d being seen today for ongoing prenatal care.  She is currently monitored for the following issues for this high-risk pregnancy and has Supervision of high risk pregnancy, antepartum; Chronic hypertension complicating pregnancy, antepartum; Abnormal glucose affecting pregnancy; Bradycardia; and Language barrier on her problem list.  Patient reports no complaints.   Contractions: Not present. Vag. Bleeding: None.  Movement: Present. Denies leaking of fluid.   The following portions of the patient's history were reviewed and updated as appropriate: allergies, current medications, past family history, past medical history, past social history, past surgical history and problem list. Problem list updated.  Objective:   Vitals:   02/01/16 0743  BP: 136/81  Pulse: (!) 57  Weight: 145 lb 6.4 oz (66 kg)    Fetal Status: Fetal Heart Rate (bpm): NST   Movement: Present     General:  Alert, oriented and cooperative. Patient is in no acute distress.  Skin: Skin is warm and dry. No rash noted.   Cardiovascular: Normal heart rate noted  Respiratory: Normal respiratory effort, no problems with respiration noted  Abdomen: Soft, gravid, appropriate for gestational age. Pain/Pressure: Absent     Pelvic:  Cervical exam deferred        Extremities: Normal range of motion.  Edema: None  Mental Status: Normal mood and affect. Normal behavior. Normal judgment and thought content.   Urinalysis:      Assessment and Plan:  Pregnancy: G4P3003 at 5762w0d  1. Chronic hypertension complicating or reason for care during pregnancy, third trimester Normal BPP and U/A dopplers on 10/23. rNST today (130 baseline, +accels, no decel, mod var, one UC over 4938m). It looks like MFM is doing qwk BPPs so we can d/c the NSTs for now and f/u her rpt growth on Monday (fetus had small  AC with EFW 21% at last check).  Pt on labetalol 200 bid and told to go to 100 bid b/c of her asymptomatic bradycardia. Continue baby asa.  Will f/u in one week - Fetal nonstress test  2. Supervision of high risk pregnancy, antepartum Routine care. Pt desires paragard  3. Language barrier Interpreter used   Preterm labor symptoms and general obstetric precautions including but not limited to vaginal bleeding, contractions, leaking of fluid and fetal movement were reviewed in detail with the patient. Please refer to After Visit Summary for other counseling recommendations.  Return in about 4 days (around 02/05/2016) for as scheduled.   Vance Bingharlie Kaylany Tesoriero, MD

## 2016-02-05 ENCOUNTER — Other Ambulatory Visit (HOSPITAL_COMMUNITY): Payer: Self-pay | Admitting: Maternal and Fetal Medicine

## 2016-02-05 ENCOUNTER — Ambulatory Visit (INDEPENDENT_AMBULATORY_CARE_PROVIDER_SITE_OTHER): Payer: Self-pay | Admitting: Obstetrics and Gynecology

## 2016-02-05 ENCOUNTER — Other Ambulatory Visit: Payer: Self-pay

## 2016-02-05 ENCOUNTER — Ambulatory Visit (HOSPITAL_COMMUNITY)
Admission: RE | Admit: 2016-02-05 | Discharge: 2016-02-05 | Disposition: A | Discharge status: Home / Self Care | Payer: Self-pay | Source: Ambulatory Visit | Attending: Nurse Practitioner | Admitting: Nurse Practitioner

## 2016-02-05 VITALS — BP 155/83 | HR 52

## 2016-02-05 DIAGNOSIS — O365930 Maternal care for other known or suspected poor fetal growth, third trimester, not applicable or unspecified: Secondary | ICD-10-CM | POA: Insufficient documentation

## 2016-02-05 DIAGNOSIS — O10013 Pre-existing essential hypertension complicating pregnancy, third trimester: Secondary | ICD-10-CM | POA: Insufficient documentation

## 2016-02-05 DIAGNOSIS — O10919 Unspecified pre-existing hypertension complicating pregnancy, unspecified trimester: Secondary | ICD-10-CM

## 2016-02-05 DIAGNOSIS — O10913 Unspecified pre-existing hypertension complicating pregnancy, third trimester: Secondary | ICD-10-CM

## 2016-02-05 DIAGNOSIS — Z3A34 34 weeks gestation of pregnancy: Secondary | ICD-10-CM | POA: Insufficient documentation

## 2016-02-05 NOTE — Progress Notes (Signed)
Pt denies H/A or visual disturbances.  She states she has not taken her morning dose of Labetalol yet today. US for growth, BPP and UA doppler done today.

## 2016-02-05 NOTE — Progress Notes (Signed)
NST Note Date: 02/05/2016 Gestational Age: 33/4 FHT: positive accelerations, negative, decelerations, moderate variability Toco: rare UCs Time: 40 minutes  A/P: rNST. Continue current plan of care.   Gabrielle Park, Jr MD Attending [facility] Mid[facility](Faculty Practice)

## 2016-02-07 ENCOUNTER — Other Ambulatory Visit (HOSPITAL_COMMUNITY): Payer: Self-pay | Admitting: *Deleted

## 2016-02-08 ENCOUNTER — Ambulatory Visit (INDEPENDENT_AMBULATORY_CARE_PROVIDER_SITE_OTHER): Payer: Self-pay | Admitting: Family Medicine

## 2016-02-08 VITALS — BP 126/83 | HR 68 | Wt 147.7 lb

## 2016-02-08 DIAGNOSIS — O4100X0 Oligohydramnios, unspecified trimester, not applicable or unspecified: Secondary | ICD-10-CM

## 2016-02-08 DIAGNOSIS — O10913 Unspecified pre-existing hypertension complicating pregnancy, third trimester: Secondary | ICD-10-CM

## 2016-02-08 DIAGNOSIS — O0990 Supervision of high risk pregnancy, unspecified, unspecified trimester: Secondary | ICD-10-CM

## 2016-02-08 DIAGNOSIS — O10919 Unspecified pre-existing hypertension complicating pregnancy, unspecified trimester: Secondary | ICD-10-CM

## 2016-02-08 LAB — POCT URINALYSIS DIP (DEVICE)
Bilirubin Urine: NEGATIVE
Glucose, UA: NEGATIVE mg/dL
HGB URINE DIPSTICK: NEGATIVE
KETONES UR: NEGATIVE mg/dL
Nitrite: NEGATIVE
PROTEIN: NEGATIVE mg/dL
SPECIFIC GRAVITY, URINE: 1.02 (ref 1.005–1.030)
UROBILINOGEN UA: 0.2 mg/dL (ref 0.0–1.0)
pH: 6 (ref 5.0–8.0)

## 2016-02-08 NOTE — Patient Instructions (Signed)
Tercer trimestre de embarazo (Third Trimester of Pregnancy) El tercer trimestre comprende desde la semana29 hasta la semana42, es decir, desde el mes7 hasta el mes9. El tercer trimestre es un perodo en el que el feto crece rpidamente. Hacia el final del noveno mes, el feto mide alrededor de 20pulgadas (45cm) de largo y pesa entre 6 y 10 libras (2,700 y 4,500kg).  CAMBIOS EN EL ORGANISMO Su organismo atraviesa por muchos cambios durante el embarazo, y estos varan de una mujer a otra.   Seguir aumentando de peso. Es de esperar que aumente entre 25 y 35libras (11 y 16kg) hacia el final del embarazo.  Podrn aparecer las primeras estras en las caderas, el abdomen y las mamas.  Puede tener necesidad de orinar con ms frecuencia porque el feto baja hacia la pelvis y ejerce presin sobre la vejiga.  Debido al embarazo podr sentir acidez estomacal con frecuencia.  Puede estar estreida, ya que ciertas hormonas enlentecen los movimientos de los msculos que empujan los desechos a travs de los intestinos.  Pueden aparecer hemorroides o abultarse e hincharse las venas (venas varicosas).  Puede sentir dolor plvico debido al aumento de peso y a que las hormonas del embarazo relajan las articulaciones entre los huesos de la pelvis. El dolor de espalda puede ser consecuencia de la sobrecarga de los msculos que soportan la postura.  Tal vez haya cambios en el cabello que pueden incluir su engrosamiento, crecimiento rpido y cambios en la textura. Adems, a algunas mujeres se les cae el cabello durante o despus del embarazo, o tienen el cabello seco o fino. Lo ms probable es que el cabello se le normalice despus del nacimiento del beb.  Las mamas seguirn creciendo y le dolern. A veces, puede haber una secrecin amarilla de las mamas llamada calostro.  El ombligo puede salir hacia afuera.  Puede sentir que le falta el aire debido a que se expande el tero.  Puede notar que el feto  "baja" o lo siente ms bajo, en el abdomen.  Puede tener una prdida de secrecin mucosa con sangre. Esto suele ocurrir en el trmino de unos pocos das a una semana antes de que comience el trabajo de parto.  El cuello del tero se vuelve delgado y blando (se borra) cerca de la fecha de parto. QU DEBE ESPERAR EN LOS EXMENES PRENATALES  Le harn exmenes prenatales cada 2semanas hasta la semana36. A partir de ese momento le harn exmenes semanales. Durante una visita prenatal de rutina:  La pesarn para asegurarse de que usted y el feto estn creciendo normalmente.  Le tomarn la presin arterial.  Le medirn el abdomen para controlar el desarrollo del beb.  Se escucharn los latidos cardacos fetales.  Se evaluarn los resultados de los estudios solicitados en visitas anteriores.  Le revisarn el cuello del tero cuando est prxima la fecha de parto para controlar si este se ha borrado. Alrededor de la semana36, el mdico le revisar el cuello del tero. Al mismo tiempo, realizar un anlisis de las secreciones del tejido vaginal. Este examen es para determinar si hay un tipo de bacteria, estreptococo Grupo B. El mdico le explicar esto con ms detalle. El mdico puede preguntarle lo siguiente:  Cmo le gustara que fuera el parto.  Cmo se siente.  Si siente los movimientos del beb.  Si ha tenido sntomas anormales, como prdida de lquido, sangrado, dolores de cabeza intensos o clicos abdominales.  Si est consumiendo algn producto que contenga tabaco, como cigarrillos, tabaco   de mascar y cigarrillos electrnicos.  Si tiene alguna pregunta. Otros exmenes o estudios de deteccin que pueden realizarse durante el tercer trimestre incluyen lo siguiente:  Anlisis de sangre para controlar los niveles de hierro (anemia).  Controles fetales para determinar su salud, nivel de actividad y crecimiento. Si tiene alguna enfermedad o hay problemas durante el embarazo, le harn  estudios.  Prueba del VIH (virus de inmunodeficiencia humana). Si corre un riesgo alto, pueden realizarle una prueba de deteccin del VIH durante el tercer trimestre del embarazo. FALSO TRABAJO DE PARTO Es posible que sienta contracciones leves e irregulares que finalmente desaparecen. Se llaman contracciones de Braxton Hicks o falso trabajo de parto. Las contracciones pueden durar horas, das o incluso semanas, antes de que el verdadero trabajo de parto se inicie. Si las contracciones ocurren a intervalos regulares, se intensifican o se hacen dolorosas, lo mejor es que la revise el mdico.  SIGNOS DE TRABAJO DE PARTO   Clicos de tipo menstrual.  Contracciones cada 5minutos o menos.  Contracciones que comienzan en la parte superior del tero y se extienden hacia abajo, a la zona inferior del abdomen y la espalda.  Sensacin de mayor presin en la pelvis o dolor de espalda.  Una secrecin de mucosidad acuosa o con sangre que sale de la vagina. Si tiene alguno de estos signos antes de la semana37 del embarazo, llame a su mdico de inmediato. Debe concurrir al hospital para que la controlen inmediatamente. INSTRUCCIONES PARA EL CUIDADO EN EL HOGAR   Evite fumar, consumir hierbas, beber alcohol y tomar frmacos que no le hayan recetado. Estas sustancias qumicas afectan la formacin y el desarrollo del beb.  No consuma ningn producto que contenga tabaco, lo que incluye cigarrillos, tabaco de mascar y cigarrillos electrnicos. Si necesita ayuda para dejar de fumar, consulte al mdico. Puede recibir asesoramiento y otro tipo de recursos para dejar de fumar.  Siga las indicaciones del mdico en relacin con el uso de medicamentos. Durante el embarazo, hay medicamentos que son seguros de tomar y otros que no.  Haga ejercicio solamente como se lo haya indicado el mdico. Sentir clicos uterinos es un buen signo para detener la actividad fsica.  Contine comiendo alimentos sanos con  regularidad.  Use un sostn que le brinde buen soporte si le duelen las mamas.  No se d baos de inmersin en agua caliente, baos turcos ni saunas.  Use el cinturn de seguridad en todo momento mientras conduce.  No coma carne cruda ni queso sin cocinar; evite el contacto con las bandejas sanitarias de los gatos y la tierra que estos animales usan. Estos elementos contienen grmenes que pueden causar defectos congnitos en el beb.  Tome las vitaminas prenatales.  Tome entre 1500 y 2000mg de calcio diariamente comenzando en la semana20 del embarazo hasta el parto.  Si est estreida, pruebe un laxante suave (si el mdico lo autoriza). Consuma ms alimentos ricos en fibra, como vegetales y frutas frescos y cereales integrales. Beba gran cantidad de lquido para mantener la orina de tono claro o color amarillo plido.  Dese baos de asiento con agua tibia para aliviar el dolor o las molestias causadas por las hemorroides. Use una crema para las hemorroides si el mdico la autoriza.  Si tiene venas varicosas, use medias de descanso. Eleve los pies durante 15minutos, 3 o 4veces por da. Limite el consumo de sal en su dieta.  Evite levantar objetos pesados, use zapatos de tacones bajos y mantenga una buena postura.  Descanse   con las piernas elevadas si tiene calambres o dolor de cintura.  Visite a su dentista si no lo ha hecho durante el embarazo. Use un cepillo de dientes blando para higienizarse los dientes y psese el hilo dental con suavidad.  Puede seguir manteniendo relaciones sexuales, a menos que el mdico le indique lo contrario.  No haga viajes largos excepto que sea absolutamente necesario y solo con la autorizacin del mdico.  Tome clases prenatales para entender, practicar y hacer preguntas sobre el trabajo de parto y el parto.  Haga un ensayo de la partida al hospital.  Prepare el bolso que llevar al hospital.  Prepare la habitacin del beb.  Concurra a todas  las visitas prenatales segn las indicaciones de su mdico. SOLICITE ATENCIN MDICA SI:  No est segura de que est en trabajo de parto o de que ha roto la bolsa de las aguas.  Tiene mareos.  Siente clicos leves, presin en la pelvis o dolor persistente en el abdomen.  Tiene nuseas, vmitos o diarrea persistentes.  Observa una secrecin vaginal con mal olor.  Siente dolor al orinar. SOLICITE ATENCIN MDICA DE INMEDIATO SI:   Tiene fiebre.  Tiene una prdida de lquido por la vagina.  Tiene sangrado o pequeas prdidas vaginales.  Siente dolor intenso o clicos en el abdomen.  Sube o baja de peso rpidamente.  Tiene dificultad para respirar y siente dolor de pecho.  Sbitamente se le hinchan mucho el rostro, las manos, los tobillos, los pies o las piernas.  No ha sentido los movimientos del beb durante una hora.  Siente un dolor de cabeza intenso que no se alivia con medicamentos.  Su visin se modifica.   Esta informacin no tiene como fin reemplazar el consejo del mdico. Asegrese de hacerle al mdico cualquier pregunta que tenga.   Document Released: 01/02/2005 Document Revised: 04/15/2014 Elsevier Interactive Patient Education 2016 [employer]   Lactancia materna (Breastfeeding) Decidir amamantar es una de las mejores elecciones que puede hacer por usted y su beb. El cambio hormonal durante el embarazo produce el desarrollo del tejido mamario y aumenta la cantidad y el tamao de los conductos galactforos. Estas hormonas tambin permiten que las protenas, los azcares y las grasas de la sangre produzcan la leche materna en las glndulas productoras de leche. Las hormonas impiden que la leche materna sea liberada antes del nacimiento del beb, adems de impulsar el flujo de leche luego del nacimiento. Una vez que ha comenzado a amamantar, pensar en el beb, as como la succin o el llanto, pueden estimular la liberacin de leche de las glndulas productoras de  leche.  LOS BENEFICIOS DE AMAMANTAR Para el beb  La primera leche (calostro) ayuda a mejorar el funcionamiento del sistema digestivo del beb.  La leche tiene anticuerpos que ayudan a prevenir las infecciones en el beb.  El beb tiene una menor incidencia de asma, alergias y del sndrome de muerte sbita del lactante.  Los nutrientes en la leche materna son mejores para el beb que la leche maternizada y estn preparados exclusivamente para cubrir las necesidades del beb.  La leche materna mejora el desarrollo cerebral del beb.  Es menos probable que el beb desarrolle otras enfermedades, como obesidad infantil, asma o diabetes mellitus de tipo 2. Para usted   La lactancia materna favorece el desarrollo de un vnculo muy especial entre la madre y el beb.  Es conveniente. La leche materna siempre est disponible a la temperatura correcta y es econmica.  La lactancia   materna ayuda a quemar caloras y a perder el peso ganado durante el embarazo.  Favorece la contraccin del tero al tamao que tena antes del embarazo de manera ms rpida y disminuye el sangrado (loquios) despus del parto.  La lactancia materna contribuye a reducir el riesgo de desarrollar diabetes mellitus de tipo 2, osteoporosis o cncer de mama o de ovario en el futuro. SIGNOS DE QUE EL BEB EST HAMBRIENTO Primeros signos de hambre  Aumenta su estado de alerta o actividad.  Se estira.  Mueve la cabeza de un lado a otro.  Mueve la cabeza y abre la boca cuando se le toca la mejilla o la comisura de la boca (reflejo de bsqueda).  Aumenta las vocalizaciones, tales como sonidos de succin, se relame los labios, emite arrullos, suspiros, o chirridos.  Mueve la mano hacia la boca.  Se chupa con ganas los dedos o las manos. Signos tardos de hambre  Est agitado.  Llora de manera intermitente. Signos de hambre extrema Los signos de hambre extrema requerirn que lo calme y lo consuele antes de que el  beb pueda alimentarse adecuadamente. No espere a que se manifiesten los siguientes signos de hambre extrema para comenzar a amamantar:   Agitacin.  Llanto intenso y fuerte.  Gritos. INFORMACIN BSICA SOBRE LA LACTANCIA MATERNA Iniciacin de la lactancia materna  Encuentre un lugar cmodo para sentarse o acostarse, con un buen respaldo para el cuello y la espalda.  Coloque una almohada o una manta enrollada debajo del beb para acomodarlo a la altura de la mama (si est sentada). Las almohadas para amamantar se han diseado especialmente a fin de servir de apoyo para los brazos y el beb mientras amamanta.  Asegrese de que el abdomen del beb est frente al suyo.   Masajee suavemente la mama. Con las yemas de los dedos, masajee la pared del pecho hacia el pezn en un movimiento circular. Esto estimula el flujo de leche. Es posible que deba continuar este movimiento mientras amamanta si la leche fluye lentamente.  Sostenga la mama con el pulgar por arriba del pezn y los otros 4 dedos por debajo de la mama. Asegrese de que los dedos se encuentren lejos del pezn y de la boca del beb.  Empuje suavemente los labios del beb con el pezn o con el dedo.  Cuando la boca del beb se abra lo suficiente, acrquelo rpidamente a la mama e introduzca todo el pezn y la zona oscura que lo rodea (areola), tanto como sea posible, dentro de la boca del beb.  Debe haber ms areola visible por arriba del labio superior del beb que por debajo del labio inferior.  La lengua del beb debe estar entre la enca inferior y la mama.  Asegrese de que la boca del beb est en la posicin correcta alrededor del pezn (prendida). Los labios del beb deben crear un sello sobre la mama y estar doblados hacia afuera (invertidos).  Es comn que el beb succione durante 2 a 3 minutos para que comience el flujo de leche materna. Cmo debe prenderse Es muy importante que le ensee al beb cmo prenderse  adecuadamente a la mama. Si el beb no se prende adecuadamente, puede causarle dolor en el pezn y reducir la produccin de leche materna, y hacer que el beb tenga un escaso aumento de peso. Adems, si el beb no se prende adecuadamente al pezn, puede tragar aire durante la alimentacin. Esto puede causarle molestias al beb. Hacer eructar al beb al   cambiar de mama puede ayudarlo a liberar el aire. Sin embargo, ensearle al beb cmo prenderse a la mama adecuadamente es la mejor manera de evitar que se sienta molesto por tragar aire mientras se alimenta. Signos de que el beb se ha prendido adecuadamente al pezn:   Tironea o succiona de modo silencioso, sin causarle dolor.  Se escucha que traga cada 3 o 4 succiones.  Hay movimientos musculares por arriba y por delante de sus odos al succionar. Signos de que el beb no se ha prendido adecuadamente al pezn:   Hace ruidos de succin o de chasquido mientras se alimenta.  Siente dolor en el pezn. Si cree que el beb no se prendi correctamente, deslice el dedo en la comisura de la boca y colquelo entre las encas del beb para interrumpir la succin. Intente comenzar a amamantar nuevamente. Signos de lactancia materna exitosa Signos del beb:   Disminuye gradualmente el nmero de succiones o cesa la succin por completo.  Se duerme.  Relaja el cuerpo.  Retiene una pequea cantidad de leche en la boca.  Se desprende solo del pecho. Signos que presenta usted:  Las mamas han aumentado la firmeza, el peso y el tamao 1 a 3 horas despus de amamantar.  Estn ms blandas inmediatamente despus de amamantar.  Un aumento del volumen de leche, y tambin un cambio en su consistencia y color se producen hacia el quinto da de lactancia materna.  Los pezones no duelen, ni estn agrietados ni sangran. Signos de que su beb recibe la cantidad de leche suficiente  Moja al menos 3 paales en 24 horas. La orina debe ser clara y de color  amarillo plido a los 5 das de vida.  Defeca al menos 3 veces en 24 horas a los 5 das de vida. La materia fecal debe ser blanda y amarillenta.  Defeca al menos 3 veces en 24 horas a los 7 das de vida. La materia fecal debe ser grumosa y amarillenta.  No registra una prdida de peso mayor del 10% del peso al nacer durante los primeros 3 das de vida.  Aumenta de peso un promedio de 4 a 7onzas (113 a 198g) por semana despus de los 4 das de vida.  Aumenta de peso, diariamente, de manera uniforme a partir de los 5 das de vida, sin registrar prdida de peso despus de las 2semanas de vida. Despus de alimentarse, es posible que el beb regurgite una pequea cantidad. Esto es frecuente. FRECUENCIA Y DURACIN DE LA LACTANCIA MATERNA El amamantamiento frecuente la ayudar a producir ms leche y a prevenir problemas de dolor en los pezones e hinchazn en las mamas. Alimente al beb cuando muestre signos de hambre o si siente la necesidad de reducir la congestin de las mamas. Esto se denomina "lactancia a demanda". Evite el uso del chupete mientras trabaja para establecer la lactancia (las primeras 4 a 6 semanas despus del nacimiento del beb). Despus de este perodo, podr ofrecerle un chupete. Las investigaciones demostraron que el uso del chupete durante el primer ao de vida del beb disminuye el riesgo de desarrollar el sndrome de muerte sbita del lactante (SMSL). Permita que el nio se alimente en cada mama todo lo que desee. Contine amamantando al beb hasta que haya terminado de alimentarse. Cuando el beb se desprende o se queda dormido mientras se est alimentando de la primera mama, ofrzcale la segunda. Debido a que, con frecuencia, los recin nacidos permanecen somnolientos las primeras semanas de vida, es posible   que deba despertar al beb para alimentarlo. Los horarios de lactancia varan de un beb a otro. Sin embargo, las siguientes reglas pueden servir como gua para ayudarla a  garantizar que el beb se alimenta adecuadamente:  Se puede amamantar a los recin nacidos (bebs de 4 semanas o menos de vida) cada 1 a 3 horas.  No deben transcurrir ms de 3 horas durante el da o 5 horas durante la noche sin que se amamante a los recin nacidos.  Debe amamantar al beb 8 veces como mnimo en un perodo de 24 horas, hasta que comience a introducir slidos en su dieta, a los 6 meses de vida aproximadamente. EXTRACCIN DE LECHE MATERNA La extraccin y el almacenamiento de la leche materna le permiten asegurarse de que el beb se alimente exclusivamente de leche materna, aun en momentos en los que no puede amamantar. Esto tiene especial importancia si debe regresar al trabajo en el perodo en que an est amamantando o si no puede estar presente en los momentos en que el beb debe alimentarse. Su asesor en lactancia puede orientarla sobre cunto tiempo es seguro almacenar leche materna.  El sacaleche es un aparato que le permite extraer leche de la mama a un recipiente estril. Luego, la leche materna extrada puede almacenarse en un refrigerador o congelador. Algunos sacaleches son manuales, mientras que otros son elctricos. Consulte a su asesor en lactancia qu tipo ser ms conveniente para usted. Los sacaleches se pueden comprar; sin embargo, algunos hospitales y grupos de apoyo a la lactancia materna alquilan sacaleches mensualmente. Un asesor en lactancia puede ensearle cmo extraer leche materna manualmente, en caso de que prefiera no usar un sacaleche.  CMO CUIDAR LAS MAMAS DURANTE LA LACTANCIA MATERNA Los pezones se secan, agrietan y duelen durante la lactancia materna. Las siguientes recomendaciones pueden ayudarla a mantener las mamas humectadas y sanas:  Evite usar jabn en los pezones.  Use un sostn de soporte. Aunque no son esenciales, las camisetas sin mangas o los sostenes especiales para amamantar estn diseados para acceder fcilmente a las mamas, para amamantar  sin tener que quitarse todo el sostn o la camiseta. Evite usar sostenes con aro o sostenes muy ajustados.  Seque al aire sus pezones durante 3 a 4minutos despus de amamantar al beb.  Utilice solo apsitos de algodn en el sostn para absorber las prdidas de leche. La prdida de un poco de leche materna entre las tomas es normal.  Utilice lanolina sobre los pezones luego de amamantar. La lanolina ayuda a mantener la humedad normal de la piel. Si usa lanolina pura, no tiene que lavarse los pezones antes de volver a alimentar al beb. La lanolina pura no es txica para el beb. Adems, puede extraer manualmente algunas gotas de leche materna y masajear suavemente esa leche sobre los pezones, para que la leche se seque al aire. Durante las primeras semanas despus de dar a luz, algunas mujeres pueden experimentar hinchazn en las mamas (congestin mamaria). La congestin puede hacer que sienta las mamas pesadas, calientes y sensibles al tacto. El pico de la congestin ocurre dentro de los 3 a 5 das despus del parto. Las siguientes recomendaciones pueden ayudarla a aliviar la congestin:  Vace por completo las mamas al amamantar o extraer leche. Puede aplicar calor hmedo en las mamas (en la ducha o con toallas hmedas para manos) antes de amamantar o extraer leche. Esto aumenta la circulacin y ayuda a que la leche fluya. Si el beb no vaca por completo las   mamas cuando lo amamanta, extraiga la leche restante despus de que haya finalizado.  Use un sostn ajustado (para amamantar o comn) o una camiseta sin mangas durante 1 o 2 das para indicar al cuerpo que disminuya ligeramente la produccin de leche.  Aplique compresas de hielo sobre las mamas, a menos que le resulte demasiado incmodo.  Asegrese de que el beb est prendido y se encuentre en la posicin correcta mientras lo alimenta. Si la congestin persiste luego de 48 horas o despus de seguir estas recomendaciones, comunquese con su  mdico o un asesor en lactancia. RECOMENDACIONES GENERALES PARA EL CUIDADO DE LA SALUD DURANTE LA LACTANCIA MATERNA  Consuma alimentos saludables. Alterne comidas y colaciones, y coma 3 de cada una por da. Dado que lo que come afecta la leche materna, es posible que algunas comidas hagan que su beb se vuelva ms irritable de lo habitual. Evite comer este tipo de alimentos si percibe que afectan de manera negativa al beb.  Beba leche, jugos de fruta y agua para satisfacer su sed (aproximadamente 10 vasos al da).  Descanse con frecuencia, reljese y tome sus vitaminas prenatales para evitar la fatiga, el estrs y la anemia.  Contine con los autocontroles de la mama.  Evite masticar y fumar tabaco. Las sustancias qumicas de los cigarrillos que pasan a la leche materna y la exposicin al humo ambiental del tabaco pueden daar al beb.  No consuma alcohol ni drogas, incluida la marihuana. Algunos medicamentos, que pueden ser perjudiciales para el beb, pueden pasar a travs de la leche materna. Es importante que consulte a su mdico antes de tomar cualquier medicamento, incluidos todos los medicamentos recetados y de venta libre, as como los suplementos vitamnicos y herbales. Puede quedar embarazada durante la lactancia. Si desea controlar la natalidad, consulte a su mdico cules son las opciones ms seguras para el beb. SOLICITE ATENCIN MDICA SI:   Usted siente que quiere dejar de amamantar o se siente frustrada con la lactancia.  Siente dolor en las mamas o en los pezones.  Sus pezones estn agrietados o sangran.  Sus pechos estn irritados, sensibles o calientes.  Tiene un rea hinchada en cualquiera de las mamas.  Siente escalofros o fiebre.  Tiene nuseas o vmitos.  Presenta una secrecin de otro lquido distinto de la leche materna de los pezones.  Sus mamas no se llenan antes de amamantar al beb para el quinto da despus del parto.  Se siente triste y  deprimida.  El beb est demasiado somnoliento como para comer bien.  El beb tiene problemas para dormir.  Moja menos de 3 paales en 24 horas.  Defeca menos de 3 veces en 24 horas.  La piel del beb o la parte blanca de los ojos se vuelven amarillentas.  El beb no ha aumentado de peso a los 5 das de vida. SOLICITE ATENCIN MDICA DE INMEDIATO SI:   El beb est muy cansado (letargo) y no se quiere despertar para comer.  Le sube la fiebre sin causa.   Esta informacin no tiene como fin reemplazar el consejo del mdico. Asegrese de hacerle al mdico cualquier pregunta que tenga.   Document Released: 03/25/2005 Document Revised: 12/14/2014 Elsevier Interactive Patient Education 2016 [employer]  

## 2016-02-08 NOTE — Progress Notes (Signed)
Spanish interpreter: [phone]#[id number] used   PRENATAL VISIT NOTE  Subjective:  Gabrielle Park is a 35 y.o. G4P3003 at 9356w0d being seen today for ongoing prenatal care.  She is currently monitored for the following issues for this high-risk pregnancy and has Supervision of high risk pregnancy, antepartum; Chronic hypertension complicating pregnancy, antepartum; Bradycardia; and Language barrier on her problem list.  Patient reports no complaints.  Contractions: Not present. Vag. Bleeding: None.  Movement: Present. Denies leaking of fluid.   The following portions of the patient's history were reviewed and updated as appropriate: allergies, current medications, past family history, past medical history, past social history, past surgical history and problem list. Problem list updated.  Objective:   Vitals:   02/08/16 0810  BP: 126/83  Pulse: 68  Weight: 147 lb 11.2 oz (67 kg)    Fetal Status: Fetal Heart Rate (bpm): NSt   Movement: Present     General:  Alert, oriented and cooperative. Patient is in no acute distress.  Skin: Skin is warm and dry. No rash noted.   Cardiovascular: Normal heart rate noted  Respiratory: Normal respiratory effort, no problems with respiration noted  Abdomen: Soft, gravid, appropriate for gestational age. Pain/Pressure: Present     Pelvic:  Cervical exam deferred        Extremities: Normal range of motion.  Edema: Trace  Mental Status: Normal mood and affect. Normal behavior. Normal judgment and thought content.  NST reviewed and reactive.  Assessment and Plan:  Pregnancy: G4P3003 at 8356w0d  1. Supervision of high risk pregnancy, antepartum Cultures next visit  2. Chronic hypertension complicating pregnancy, antepartum With SGA 19% and AFi of 6 at last visit Weekly BPP and Dopplers in MFM - Fetal nonstress test Continue Labetalol and ASA  Preterm labor symptoms and general obstetric precautions including but not limited to vaginal bleeding,  contractions, leaking of fluid and fetal movement were reviewed in detail with the patient. Please refer to After Visit Summary for other counseling recommendations.  Return in about 1 week (around 02/15/2016) for obfu/nst, OB visit and NST, HRC.  Reva Boresanya S Eban Weick, MD

## 2016-02-12 ENCOUNTER — Ambulatory Visit (HOSPITAL_COMMUNITY): Payer: Self-pay

## 2016-02-12 ENCOUNTER — Encounter (HOSPITAL_COMMUNITY): Payer: Self-pay

## 2016-02-12 ENCOUNTER — Ambulatory Visit (HOSPITAL_COMMUNITY)
Admission: RE | Admit: 2016-02-12 | Discharge: 2016-02-12 | Disposition: A | Discharge status: Home / Self Care | Payer: Self-pay | Source: Ambulatory Visit | Attending: Family Medicine | Admitting: Family Medicine

## 2016-02-12 DIAGNOSIS — Z3A35 35 weeks gestation of pregnancy: Secondary | ICD-10-CM | POA: Insufficient documentation

## 2016-02-12 DIAGNOSIS — O365930 Maternal care for other known or suspected poor fetal growth, third trimester, not applicable or unspecified: Secondary | ICD-10-CM | POA: Insufficient documentation

## 2016-02-12 DIAGNOSIS — O10013 Pre-existing essential hypertension complicating pregnancy, third trimester: Secondary | ICD-10-CM | POA: Insufficient documentation

## 2016-02-12 NOTE — ED Notes (Signed)
Pt reports one episode of leaking clear, watery fluid on 11/3. No leaking today.

## 2016-02-12 NOTE — Addendum Note (Signed)
Encounter addended by: Marcellina MillinKelly L Paulyne Mooty on: 02/12/2016 10:52 AM<BR>    Actions taken: Imaging Exam ended

## 2016-02-16 ENCOUNTER — Encounter: Payer: Self-pay | Admitting: Obstetrics and Gynecology

## 2016-02-16 ENCOUNTER — Ambulatory Visit (INDEPENDENT_AMBULATORY_CARE_PROVIDER_SITE_OTHER): Payer: Self-pay | Admitting: Obstetrics and Gynecology

## 2016-02-16 VITALS — BP 126/77 | HR 71 | Wt 149.9 lb

## 2016-02-16 DIAGNOSIS — O0990 Supervision of high risk pregnancy, unspecified, unspecified trimester: Secondary | ICD-10-CM

## 2016-02-16 DIAGNOSIS — Z789 Other specified health status: Secondary | ICD-10-CM

## 2016-02-16 DIAGNOSIS — O10919 Unspecified pre-existing hypertension complicating pregnancy, unspecified trimester: Secondary | ICD-10-CM

## 2016-02-16 DIAGNOSIS — Z603 Acculturation difficulty: Secondary | ICD-10-CM

## 2016-02-16 DIAGNOSIS — O10913 Unspecified pre-existing hypertension complicating pregnancy, third trimester: Secondary | ICD-10-CM

## 2016-02-16 DIAGNOSIS — Z113 Encounter for screening for infections with a predominantly sexual mode of transmission: Secondary | ICD-10-CM

## 2016-02-16 LAB — POCT URINALYSIS DIP (DEVICE)
Glucose, UA: NEGATIVE mg/dL
HGB URINE DIPSTICK: NEGATIVE
LEUKOCYTES UA: NEGATIVE
Nitrite: NEGATIVE
PH: 6 (ref 5.0–8.0)
Protein, ur: 30 mg/dL — AB
SPECIFIC GRAVITY, URINE: 1.025 (ref 1.005–1.030)
UROBILINOGEN UA: 0.2 mg/dL (ref 0.0–1.0)

## 2016-02-16 NOTE — Progress Notes (Signed)
Pt is receiving weekly BPP, UA doppler and AFI @ [facility].

## 2016-02-16 NOTE — Progress Notes (Signed)
Subjective:  Gabrielle Park is a 35 y.o. G4P3003 at 687w1d being seen today for ongoing prenatal care.  She is currently monitored for the following issues for this high-risk pregnancy and has Supervision of high risk pregnancy, antepartum; Chronic hypertension complicating pregnancy, antepartum; Language barrier; and SGA (small for gestational age) on her problem list.  Patient reports no complaints.  Contractions: Irregular. Vag. Bleeding: None.  Movement: Present. Denies leaking of fluid.   The following portions of the patient's history were reviewed and updated as appropriate: allergies, current medications, past family history, past medical history, past social history, past surgical history and problem list. Problem list updated.  Objective:   Vitals:   02/16/16 0808  BP: 126/77  Pulse: 71  Weight: 149 lb 14.4 oz (68 kg)    Fetal Status: Fetal Heart Rate (bpm): NST Fundal Height: 36 cm Movement: Present     General:  Alert, oriented and cooperative. Patient is in no acute distress.  Skin: Skin is warm and dry. No rash noted.   Cardiovascular: Normal heart rate noted  Respiratory: Normal respiratory effort, no problems with respiration noted  Abdomen: Soft, gravid, appropriate for gestational age. Pain/Pressure: Present     Pelvic:  Cervical exam deferred        Extremities: Normal range of motion.     Mental Status: Normal mood and affect. Normal behavior. Normal judgment and thought content.   Urinalysis:      Assessment and Plan:  Pregnancy: G4P3003 at 4687w1d  1. Supervision of high risk pregnancy, antepartum  - Culture, beta strep (group b only) - [facility]lam[facility]  2. Chronic hypertension complicating or reason for care during pregnancy, third trimester Continue with current management.  - Fetal nonstress test MFM visit with BPP and UA dopplers on Mondays NST and OB provider appt on Thursday 3. Chronic hypertension complicating  pregnancy, antepartum   4. Language barrier   5. SGA (small for gestational age) Antenatal testing as noted above  Preterm labor symptoms and general obstetric precautions including but not limited to vaginal bleeding, contractions, leaking of fluid and fetal movement were reviewed in detail with the patient. Please refer to After Visit Summary for other counseling recommendations.  Return in about 6 days (around 02/22/2016) for Ob fu and NST;  11/22  Ob fu and NST.   Hermina StaggersMichael L Merridy Pascoe, MD

## 2016-02-18 LAB — CULTURE, BETA STREP (GROUP B ONLY)

## 2016-02-19 ENCOUNTER — Other Ambulatory Visit (HOSPITAL_COMMUNITY): Payer: Self-pay | Admitting: Maternal and Fetal Medicine

## 2016-02-19 ENCOUNTER — Ambulatory Visit (HOSPITAL_COMMUNITY)
Admission: RE | Admit: 2016-02-19 | Discharge: 2016-02-19 | Disposition: A | Discharge status: Home / Self Care | Payer: Self-pay | Source: Ambulatory Visit | Attending: Nurse Practitioner | Admitting: Nurse Practitioner

## 2016-02-19 ENCOUNTER — Encounter (HOSPITAL_COMMUNITY): Payer: Self-pay

## 2016-02-19 DIAGNOSIS — O10013 Pre-existing essential hypertension complicating pregnancy, third trimester: Secondary | ICD-10-CM

## 2016-02-19 DIAGNOSIS — O365930 Maternal care for other known or suspected poor fetal growth, third trimester, not applicable or unspecified: Secondary | ICD-10-CM | POA: Insufficient documentation

## 2016-02-19 DIAGNOSIS — Z3A36 36 weeks gestation of pregnancy: Secondary | ICD-10-CM

## 2016-02-19 LAB — GC/CHLAMYDIA PROBE AMP (~~LOC~~) NOT AT ARMC
Chlamydia: NEGATIVE
NEISSERIA GONORRHEA: NEGATIVE

## 2016-02-22 ENCOUNTER — Ambulatory Visit (INDEPENDENT_AMBULATORY_CARE_PROVIDER_SITE_OTHER): Payer: Self-pay | Admitting: Obstetrics and Gynecology

## 2016-02-22 ENCOUNTER — Encounter: Payer: Self-pay | Admitting: Obstetrics and Gynecology

## 2016-02-22 VITALS — BP 143/87 | HR 56 | Wt 147.6 lb

## 2016-02-22 DIAGNOSIS — O10913 Unspecified pre-existing hypertension complicating pregnancy, third trimester: Secondary | ICD-10-CM

## 2016-02-22 DIAGNOSIS — O10919 Unspecified pre-existing hypertension complicating pregnancy, unspecified trimester: Secondary | ICD-10-CM

## 2016-02-22 DIAGNOSIS — O0990 Supervision of high risk pregnancy, unspecified, unspecified trimester: Secondary | ICD-10-CM

## 2016-02-22 LAB — POCT URINALYSIS DIP (DEVICE)
BILIRUBIN URINE: NEGATIVE
GLUCOSE, UA: NEGATIVE mg/dL
Hgb urine dipstick: NEGATIVE
Ketones, ur: NEGATIVE mg/dL
NITRITE: NEGATIVE
PH: 5.5 (ref 5.0–8.0)
PROTEIN: NEGATIVE mg/dL
Specific Gravity, Urine: 1.02 (ref 1.005–1.030)
Urobilinogen, UA: 0.2 mg/dL (ref 0.0–1.0)

## 2016-02-22 NOTE — Progress Notes (Signed)
Video interpreter # [id number].

## 2016-02-22 NOTE — Progress Notes (Signed)
   PRENATAL VISIT NOTE  Subjective:  Gabrielle Park is a 35 y.o. G4P3003 at 9064w0d being seen today for ongoing prenatal care.  She is currently monitored for the following issues for this high-risk pregnancy and has Supervision of high risk pregnancy, antepartum; Chronic hypertension complicating pregnancy, antepartum; Language barrier; and SGA (small for gestational age) on her problem list.  Patient reports no complaints.  Contractions: Not present. Vag. Bleeding: None.  Movement: Present. Denies leaking of fluid.   The following portions of the patient's history were reviewed and updated as appropriate: allergies, current medications, past family history, past medical history, past social history, past surgical history and problem list. Problem list updated.  Objective:   Vitals:   02/22/16 1451  BP: (!) 143/87  Pulse: (!) 56  Weight: 147 lb 9.6 oz (67 kg)    Fetal Status: Fetal Heart Rate (bpm): NST   Movement: Present     General:  Alert, oriented and cooperative. Patient is in no acute distress.  Skin: Skin is warm and dry. No rash noted.   Cardiovascular: Normal heart rate noted  Respiratory: Normal respiratory effort, no problems with respiration noted  Abdomen: Soft, gravid, appropriate for gestational age. Pain/Pressure: Present     Pelvic:  Cervical exam deferred        Extremities: Normal range of motion.     Mental Status: Normal mood and affect. Normal behavior. Normal judgment and thought content.   Assessment and Plan:  Pregnancy: G4P3003 at 7464w0d  1. Chronic hypertension complicating pregnancy, antepartum Continue labetalol daily. Patient did not take morning dose of labetalol  Follow up growth ultrasound scheduled next week Plan for IOL at 39 weeks - Fetal nonstress test  2. Supervision of high risk pregnancy, antepartum Patient is doing well without complaints Reviewed culture results  Term labor symptoms and general obstetric precautions including  but not limited to vaginal bleeding, contractions, leaking of fluid and fetal movement were reviewed in detail with the patient. Please refer to After Visit Summary for other counseling recommendations.  Return in about 5 days (around 02/27/2016) for Ob fu and NST @ 1240 .   Catalina AntiguaPeggy Philipe Laswell, MD

## 2016-02-26 ENCOUNTER — Ambulatory Visit (HOSPITAL_COMMUNITY): Payer: Self-pay

## 2016-02-27 ENCOUNTER — Ambulatory Visit (INDEPENDENT_AMBULATORY_CARE_PROVIDER_SITE_OTHER): Payer: Self-pay | Admitting: Family Medicine

## 2016-02-27 ENCOUNTER — Encounter (HOSPITAL_COMMUNITY): Payer: Self-pay

## 2016-02-27 ENCOUNTER — Ambulatory Visit (HOSPITAL_COMMUNITY)
Admission: RE | Admit: 2016-02-27 | Discharge: 2016-02-27 | Disposition: A | Discharge status: Home / Self Care | Payer: Self-pay | Source: Ambulatory Visit | Attending: Maternal and Fetal Medicine | Admitting: Maternal and Fetal Medicine

## 2016-02-27 ENCOUNTER — Other Ambulatory Visit (HOSPITAL_COMMUNITY): Payer: Self-pay | Admitting: Maternal and Fetal Medicine

## 2016-02-27 VITALS — BP 135/87 | HR 67 | Wt 148.8 lb

## 2016-02-27 DIAGNOSIS — Z789 Other specified health status: Secondary | ICD-10-CM

## 2016-02-27 DIAGNOSIS — Z3A37 37 weeks gestation of pregnancy: Secondary | ICD-10-CM

## 2016-02-27 DIAGNOSIS — Z603 Acculturation difficulty: Secondary | ICD-10-CM

## 2016-02-27 DIAGNOSIS — O365930 Maternal care for other known or suspected poor fetal growth, third trimester, not applicable or unspecified: Secondary | ICD-10-CM | POA: Insufficient documentation

## 2016-02-27 DIAGNOSIS — O10013 Pre-existing essential hypertension complicating pregnancy, third trimester: Secondary | ICD-10-CM | POA: Insufficient documentation

## 2016-02-27 DIAGNOSIS — O0990 Supervision of high risk pregnancy, unspecified, unspecified trimester: Secondary | ICD-10-CM

## 2016-02-27 DIAGNOSIS — O365990 Maternal care for other known or suspected poor fetal growth, unspecified trimester, not applicable or unspecified: Secondary | ICD-10-CM

## 2016-02-27 DIAGNOSIS — O10919 Unspecified pre-existing hypertension complicating pregnancy, unspecified trimester: Secondary | ICD-10-CM

## 2016-02-27 DIAGNOSIS — O10913 Unspecified pre-existing hypertension complicating pregnancy, third trimester: Secondary | ICD-10-CM

## 2016-02-27 LAB — POCT URINALYSIS DIP (DEVICE)
BILIRUBIN URINE: NEGATIVE
Glucose, UA: NEGATIVE mg/dL
HGB URINE DIPSTICK: NEGATIVE
KETONES UR: NEGATIVE mg/dL
Nitrite: NEGATIVE
PH: 6 (ref 5.0–8.0)
Protein, ur: NEGATIVE mg/dL
Specific Gravity, Urine: 1.025 (ref 1.005–1.030)
Urobilinogen, UA: 0.2 mg/dL (ref 0.0–1.0)

## 2016-02-27 NOTE — ED Notes (Signed)
Pt here today in [facility] with her own interp.  Spanish interp name is Counselling psychologistDelia Valazquez.

## 2016-02-27 NOTE — Progress Notes (Signed)
US for growth, BPP and UA doppler today @ 1315

## 2016-02-27 NOTE — Progress Notes (Signed)
   Subjective:  Gabrielle Park is a 35 y.o. G4P3003 at 254w5d being seen today for ongoing prenatal care.  She is currently monitored for the following issues for this high-risk pregnancy and has Supervision of high risk pregnancy, antepartum; Chronic hypertension complicating pregnancy, antepartum; Language barrier; and SGA (small for gestational age) on her problem list.  Patient reports no complaints.  Contractions: Irregular. Vag. Bleeding: None.  Movement: Present. Denies leaking of fluid.   The following portions of the patient's history were reviewed and updated as appropriate: allergies, current medications, past family history, past medical history, past social history, past surgical history and problem list. Problem list updated.  Objective:   Vitals:   02/27/16 1249  BP: 135/87  Pulse: 67  Weight: 148 lb 12.8 oz (67.5 kg)    Fetal Status: Fetal Heart Rate (bpm): NST   Movement: Present     General:  Alert, oriented and cooperative. Patient is in no acute distress.  Skin: Skin is warm and dry. No rash noted.   Cardiovascular: Normal heart rate noted  Respiratory: Normal respiratory effort, no problems with respiration noted  Abdomen: Soft, gravid, appropriate for gestational age. Pain/Pressure: Present     Pelvic:  Cervical exam deferred        Extremities: Normal range of motion.  Edema: None  Mental Status: Normal mood and affect. Normal behavior. Normal judgment and thought content.   Urinalysis:       I personally reviewed the patient's NST today, found to be REACTIVE. 135 bpm, mod var, +accels, no decels. CTX: Rare.   Assessment and Plan:  Pregnancy: G4P3003 at 1454w5d  1. Supervision of high risk pregnancy, antepartum - To be induced at 39 weeks, setting up appt today  2. Chronic hypertension complicating or reason for care during pregnancy, third trimester - Fetal nonstress test : REACTIVE  3. Chronic hypertension complicating pregnancy, antepartum -  Controlled today, taking Labetalol 100mg  BID  4. Language barrier - Spanish speaking  5. SGA (small for gestational age) - Growth US today, Dopplers today   Term labor symptoms and general obstetric precautions including but not limited to vaginal bleeding, contractions, leaking of fluid and fetal movement were reviewed in detail with the patient. Please refer to After Visit Summary for other counseling recommendations.  Return in about 1 week (around 03/05/2016) for Ob fu and NST .   Michaele OfferElizabeth Woodland Mumaw, DO [facility] Fellow [facility]

## 2016-02-27 NOTE — Patient Instructions (Signed)
Induccin del trabajo de parto  (Labor Induction)  Se denomina induccin del trabajo de parto cuando se inician acciones para hacer que una mujer embarazada comience el trabajo de parto. La mayora de las mujeres comienzan el trabajo de parto sin ayuda entre las semanas 37 y 42 del embarazo. Cuando esto no ocurre o cuando hay una necesidad mdica, pueden utilizarse diferentes mtodos para inducirlo. La induccin del trabajo de parto hace que el tero se contraiga. Tambin hace que el cuello del tero se ablandemadure), se abra (se dilate), y se afine (se borre). Generalmente el trabajo de parto no se induce antes de las 39 semanas excepto que haya un problema con el beb o con la madre.  Antes de inducir el trabajo de parto, el mdico considerar cierto nmero de factores incluyendo los siguientes:   El estado del beb.   Cuntas semanas tiene de embarazo.   La madurez de los pulmones del beb.   El estado del cuello del tero.   La posicin del beb.  CULES SON LOS MOTIVOS PARA INDUCIR UN PARTO?  El trabajo de parto puede inducirse por las siguientes razones:   La salud del beb o de la madre estn en riesgo.   El embarazo se ha pasado de trmino en 1 semana o ms.   Ha roto la bolsa de aguas pero no se ha iniciado el trabajo de parto por s mismo.   La madre tiene algn trastorno de salud o una enfermedad grave, como hipertensin arterial, una infeccin, desprendimiento abrupto de la placenta o diabetes.   Hay escaso lquido amnitico alrededor del beb.   El beb presenta sufrimiento.  La conveniencia o el deseo de que el beb nazca en una cierta fecha no es un motivo para inducir el parto.  CULES SON LOS MTODOS UTILIZADOS PARA INDUCIR EL TRABAJO DE PARTO?  Algunos mtodos de induccin del trabajo de parto son:   Administracin del medicamentos prostaglandina. Este medicamento hace que el cuello uterino se dilate y madure. Este medicamento tambin iniciar las contracciones. Puede tomarse por  boca o insertarse en la vagina en forma de supositorio.   Insercin en la vagina de un tubo delgado (catter) con un baln en el extremo para dilatar el cuello del tero. Una vez insertado, el baln se infla con agua, lo que provoca la apertura del cuello del tero.   Ruptura de las membranas. El mdico separa el saco amnitico del cuello uterino, haciendo que el cuello uterino se distienda y cause la liberacin de la hormona llamada progesterona. Esto hace que el tero se contraiga. Este procedimiento se realiza durante una visita al consultorio mdico. Le indicarn que vuelva a su casa y espere que se inicien las contracciones. Luego tendr que volver para la induccin.   Ruptura de la bolsa de aguas. El mdico romper el saco amnitico con un pequeo instrumento. Una vez que el saco amnitico se rompe, las contracciones deben comenzar. Pueden pasar algunas horas hasta que haga efecto.   Medicamentos que desencadenen o intensifiquen las contracciones. Se lo administrarn a travs de un catter por va intravenosa (IV) que se inserta en una de las venas del brazo.  Todos los mtodos de induccin, excepto la ruptura de membranas, se realizan en el hospital. La induccin se realizar en el hospital, de modo que usted y el beb puedan ser controlados cuidadosamente.  CUNTO TIEMPO LLEVA INDUCIR EL TRABAJO DE PARTO?  Algunas inducciones pueden demorar entre 2 y 3 das. Generalmente lleva menos   tiempo, dependiendo del estado del cuello del tero. Puede tomar ms tiempo si la induccin se realiza en etapas tempranas del embarazo o es su primer embarazo. Si han pasado 2 o 3 das y no se inicia el trabajo de parto, podrn enviarla a su casa o realizar una cesrea.  CULES SON LOS RIESGOS ASOCIADOS CON LA INDUCCiN DEL TRABAJO DE PARTO?  Algunos de los riesgos de la induccin son:   Cambios en la frecuencia cardaca fetal, por ejemplo los latidos son demasiado rpidos, o lentos, o errticos.   Riesgo de distrs  fetal.   Posibilidad de infeccin en la madre o el beb.   Aumento de la posibilidad de que sea necesaria una cesrea.   Ruptura (abrupcin) de la placenta del tero (raro).   Ruptura uterina (muy raro).  Cuando es necesario realizar la induccin por razones mdicas, los beneficios deben superar a los riesgos.  CULES SON ALGUNAS RAZONES PARA NO INDUCIR EL TRABAJO DE PARTO?  La induccin no debe realizarse si:   Se demuestra que el beb no tolera el trabajo de parto.   Fue sometida anteriormente a cirugas en el tero, como una miomectoma o le han extirpado fibromas.   La placenta est en una posicin muy baja en el tero y obstruye la abertura del cuello (placenta previa).   El beb no est ubicado con la cabeza hacia bajo.   El cordn umbilical cae hacia el canal de parto, adelante del beb. Esto puede cortar el suministro de sangre y oxgeno al beb.   Fue sometida a una cesrea anteriormente.   Hay circunstancias poco habituales, como que el beb es extremadamente prematuro.  Esta informacin no tiene como fin reemplazar el consejo del mdico. Asegrese de hacerle al mdico cualquier pregunta que tenga.  Document Released: 07/02/2007 Document Revised: 07/17/2015 Document Reviewed: 10/22/2012  Elsevier Interactive Patient Education  2017 [employer]vier Inc[employer]

## 2016-02-28 ENCOUNTER — Other Ambulatory Visit (HOSPITAL_COMMUNITY): Payer: Self-pay | Admitting: *Deleted

## 2016-02-28 DIAGNOSIS — O365990 Maternal care for other known or suspected poor fetal growth, unspecified trimester, not applicable or unspecified: Secondary | ICD-10-CM

## 2016-03-05 ENCOUNTER — Ambulatory Visit (INDEPENDENT_AMBULATORY_CARE_PROVIDER_SITE_OTHER): Payer: Self-pay | Admitting: Student

## 2016-03-05 ENCOUNTER — Encounter (HOSPITAL_COMMUNITY): Payer: Self-pay

## 2016-03-05 ENCOUNTER — Inpatient Hospital Stay (HOSPITAL_COMMUNITY)
Admission: AD | Admit: 2016-03-05 | Discharge: 2016-03-07 | DRG: 774 | Disposition: A | Discharge status: Home / Self Care | Payer: Medicaid Other | Source: Ambulatory Visit | Attending: Family Medicine | Admitting: Family Medicine

## 2016-03-05 VITALS — BP 182/99 | HR 60 | Wt 154.1 lb

## 2016-03-05 DIAGNOSIS — Z8249 Family history of ischemic heart disease and other diseases of the circulatory system: Secondary | ICD-10-CM

## 2016-03-05 DIAGNOSIS — Z833 Family history of diabetes mellitus: Secondary | ICD-10-CM

## 2016-03-05 DIAGNOSIS — O1414 Severe pre-eclampsia complicating childbirth: Secondary | ICD-10-CM | POA: Diagnosis present

## 2016-03-05 DIAGNOSIS — Z8759 Personal history of other complications of pregnancy, childbirth and the puerperium: Secondary | ICD-10-CM | POA: Diagnosis present

## 2016-03-05 DIAGNOSIS — O1002 Pre-existing essential hypertension complicating childbirth: Secondary | ICD-10-CM | POA: Diagnosis present

## 2016-03-05 DIAGNOSIS — O10913 Unspecified pre-existing hypertension complicating pregnancy, third trimester: Secondary | ICD-10-CM

## 2016-03-05 DIAGNOSIS — Z3A38 38 weeks gestation of pregnancy: Secondary | ICD-10-CM | POA: Diagnosis not present

## 2016-03-05 DIAGNOSIS — O114 Pre-existing hypertension with pre-eclampsia, complicating childbirth: Principal | ICD-10-CM | POA: Diagnosis present

## 2016-03-05 DIAGNOSIS — O1410 Severe pre-eclampsia, unspecified trimester: Secondary | ICD-10-CM | POA: Diagnosis present

## 2016-03-05 DIAGNOSIS — O0990 Supervision of high risk pregnancy, unspecified, unspecified trimester: Secondary | ICD-10-CM

## 2016-03-05 DIAGNOSIS — O365930 Maternal care for other known or suspected poor fetal growth, third trimester, not applicable or unspecified: Secondary | ICD-10-CM | POA: Diagnosis present

## 2016-03-05 DIAGNOSIS — O10919 Unspecified pre-existing hypertension complicating pregnancy, unspecified trimester: Secondary | ICD-10-CM

## 2016-03-05 HISTORY — DX: Personal history of other complications of pregnancy, childbirth and the puerperium: Z87.59

## 2016-03-05 LAB — POCT URINALYSIS DIP (DEVICE)
Bilirubin Urine: NEGATIVE
Glucose, UA: NEGATIVE mg/dL
HGB URINE DIPSTICK: NEGATIVE
KETONES UR: NEGATIVE mg/dL
Nitrite: NEGATIVE
PH: 6 (ref 5.0–8.0)
PROTEIN: 30 mg/dL — AB
Specific Gravity, Urine: >=1.03 (ref 1.005–1.030)
Urobilinogen, UA: 0.2 mg/dL (ref 0.0–1.0)

## 2016-03-05 LAB — COMPREHENSIVE METABOLIC PANEL
ALK PHOS: 171 U/L — AB (ref 38–126)
ALT: 17 U/L (ref 14–54)
AST: 27 U/L (ref 15–41)
Albumin: 2.7 g/dL — ABNORMAL LOW (ref 3.5–5.0)
Anion gap: 8 (ref 5–15)
BUN: 13 mg/dL (ref 6–20)
CHLORIDE: 108 mmol/L (ref 101–111)
CO2: 20 mmol/L — AB (ref 22–32)
CREATININE: 0.58 mg/dL (ref 0.44–1.00)
Calcium: 8.5 mg/dL — ABNORMAL LOW (ref 8.9–10.3)
GFR calc Af Amer: >60 mL/min (ref >60)
GFR calc non Af Amer: >60 mL/min (ref >60)
GLUCOSE: 92 mg/dL (ref 65–99)
Potassium: 3.8 mmol/L (ref 3.5–5.1)
SODIUM: 136 mmol/L (ref 135–145)
Total Bilirubin: 0.2 mg/dL — ABNORMAL LOW (ref 0.3–1.2)
Total Protein: 6.2 g/dL — ABNORMAL LOW (ref 6.5–8.1)

## 2016-03-05 LAB — CBC
HCT: 35.3 % — ABNORMAL LOW (ref 36.0–46.0)
HEMOGLOBIN: 11.7 g/dL — AB (ref 12.0–15.0)
MCH: 25.4 pg — ABNORMAL LOW (ref 26.0–34.0)
MCHC: 33.1 g/dL (ref 30.0–36.0)
MCV: 76.7 fL — ABNORMAL LOW (ref 78.0–100.0)
PLATELETS: 142 10*3/uL — AB (ref 150–400)
RBC: 4.6 MIL/uL (ref 3.87–5.11)
RDW: 17.6 % — AB (ref 11.5–15.5)
WBC: 8.5 10*3/uL (ref 4.0–10.5)

## 2016-03-05 LAB — TYPE AND SCREEN
ABO/RH(D): O POS
ANTIBODY SCREEN: NEGATIVE

## 2016-03-05 LAB — PROTEIN / CREATININE RATIO, URINE
CREATININE, URINE: 251 mg/dL
Protein Creatinine Ratio: 0.18 mg/mg{Cre} — ABNORMAL HIGH (ref 0.00–0.15)
Total Protein, Urine: 46 mg/dL

## 2016-03-05 MED ORDER — TERBUTALINE SULFATE 1 MG/ML IJ SOLN: 0.2500 mg | Freq: Once | INTRAMUSCULAR | Status: DC | PRN

## 2016-03-05 MED ORDER — SOD CITRATE-CITRIC ACID 500-334 MG/5ML PO SOLN: 30.0000 mL | ORAL | Status: DC | PRN

## 2016-03-05 MED ORDER — ACETAMINOPHEN 325 MG PO TABS: 650.0000 mg | ORAL_TABLET | ORAL | Status: DC | PRN

## 2016-03-05 MED ORDER — MAGNESIUM SULFATE 50 % IJ SOLN
2.0000 g/h | INTRAVENOUS | Status: AC
  Administered 2016-03-06: 2 g/h via INTRAVENOUS
  Filled 2016-03-05 (×2): qty 80

## 2016-03-05 MED ORDER — LABETALOL HCL 5 MG/ML IV SOLN
20.0000 mg | INTRAVENOUS | Status: DC | PRN
  Administered 2016-03-05: 20 mg via INTRAVENOUS
  Filled 2016-03-05: qty 4

## 2016-03-05 MED ORDER — LACTATED RINGERS IV SOLN: 500.0000 mL | INTRAVENOUS | Status: DC | PRN

## 2016-03-05 MED ORDER — ONDANSETRON HCL 4 MG/2ML IJ SOLN: 4.0000 mg | Freq: Four times a day (QID) | INTRAMUSCULAR | Status: DC | PRN

## 2016-03-05 MED ORDER — PHENYLEPHRINE 40 MCG/ML (10ML) SYRINGE FOR IV PUSH (FOR BLOOD PRESSURE SUPPORT)
80.0000 ug | PREFILLED_SYRINGE | INTRAVENOUS | Status: DC | PRN
  Filled 2016-03-05: qty 5
  Filled 2016-03-05: qty 10

## 2016-03-05 MED ORDER — FENTANYL 2.5 MCG/ML BUPIVACAINE 1/10 % EPIDURAL INFUSION (WH - ANES)
14.0000 mL/h | INTRAMUSCULAR | Status: DC | PRN
  Filled 2016-03-05: qty 100

## 2016-03-05 MED ORDER — LIDOCAINE HCL (PF) 1 % IJ SOLN: 30.0000 mL | INTRAMUSCULAR | Status: DC | PRN

## 2016-03-05 MED ORDER — OXYTOCIN 40 UNITS IN LACTATED RINGERS INFUSION - SIMPLE MED
2.5000 [IU]/h | INTRAVENOUS | Status: DC
  Administered 2016-03-05: 2.5 [IU]/h via INTRAVENOUS
  Filled 2016-03-05: qty 1000

## 2016-03-05 MED ORDER — WITCH HAZEL-GLYCERIN EX PADS: 1.0000 "application " | MEDICATED_PAD | CUTANEOUS | Status: DC | PRN

## 2016-03-05 MED ORDER — LACTATED RINGERS IV SOLN
2.0000 g/h | INTRAVENOUS | Status: DC
  Administered 2016-03-05: 2 g/h via INTRAVENOUS
  Filled 2016-03-05: qty 80

## 2016-03-05 MED ORDER — MAGNESIUM SULFATE BOLUS VIA INFUSION
4.0000 g | Freq: Once | INTRAVENOUS | Status: AC
  Administered 2016-03-05: 4 g via INTRAVENOUS
  Filled 2016-03-05: qty 500

## 2016-03-05 MED ORDER — DIPHENHYDRAMINE HCL 25 MG PO CAPS: 25.0000 mg | ORAL_CAPSULE | Freq: Four times a day (QID) | ORAL | Status: DC | PRN

## 2016-03-05 MED ORDER — EPHEDRINE 5 MG/ML INJ
10.0000 mg | INTRAVENOUS | Status: DC | PRN
  Filled 2016-03-05: qty 4

## 2016-03-05 MED ORDER — AMLODIPINE BESYLATE 10 MG PO TABS
10.0000 mg | ORAL_TABLET | Freq: Every day | ORAL | Status: DC
  Administered 2016-03-05: 10 mg via ORAL
  Filled 2016-03-05 (×2): qty 1

## 2016-03-05 MED ORDER — ONDANSETRON HCL 4 MG/2ML IJ SOLN: 4.0000 mg | INTRAMUSCULAR | Status: DC | PRN

## 2016-03-05 MED ORDER — OXYCODONE-ACETAMINOPHEN 5-325 MG PO TABS
1.0000 | ORAL_TABLET | ORAL | Status: DC | PRN
  Administered 2016-03-05: 1 via ORAL
  Filled 2016-03-05: qty 1

## 2016-03-05 MED ORDER — COCONUT OIL OIL: 1.0000 "application " | TOPICAL_OIL | Status: DC | PRN

## 2016-03-05 MED ORDER — DIPHENHYDRAMINE HCL 50 MG/ML IJ SOLN: 12.5000 mg | INTRAMUSCULAR | Status: DC | PRN

## 2016-03-05 MED ORDER — FENTANYL CITRATE (PF) 100 MCG/2ML IJ SOLN: 100.0000 ug | INTRAMUSCULAR | Status: DC | PRN

## 2016-03-05 MED ORDER — LACTATED RINGERS IV SOLN
INTRAVENOUS | Status: DC
  Administered 2016-03-05: 11:00:00 via INTRAVENOUS

## 2016-03-05 MED ORDER — DIBUCAINE 1 % RE OINT: 1.0000 "application " | TOPICAL_OINTMENT | RECTAL | Status: DC | PRN

## 2016-03-05 MED ORDER — TETANUS-DIPHTH-ACELL PERTUSSIS 5-2.5-18.5 LF-MCG/0.5 IM SUSP
0.5000 mL | Freq: Once | INTRAMUSCULAR | Status: DC
  Filled 2016-03-05: qty 0.5

## 2016-03-05 MED ORDER — PHENYLEPHRINE 40 MCG/ML (10ML) SYRINGE FOR IV PUSH (FOR BLOOD PRESSURE SUPPORT)
80.0000 ug | PREFILLED_SYRINGE | INTRAVENOUS | Status: DC | PRN
  Filled 2016-03-05: qty 5

## 2016-03-05 MED ORDER — ONDANSETRON HCL 4 MG PO TABS: 4.0000 mg | ORAL_TABLET | ORAL | Status: DC | PRN

## 2016-03-05 MED ORDER — HYDRALAZINE HCL 20 MG/ML IJ SOLN
5.0000 mg | INTRAMUSCULAR | Status: AC | PRN
  Administered 2016-03-05: 5 mg via INTRAVENOUS
  Administered 2016-03-05: 10 mg via INTRAVENOUS
  Filled 2016-03-05: qty 1

## 2016-03-05 MED ORDER — OXYTOCIN BOLUS FROM INFUSION
500.0000 mL | Freq: Once | INTRAVENOUS | Status: AC
  Administered 2016-03-05: 500 mL via INTRAVENOUS

## 2016-03-05 MED ORDER — ZOLPIDEM TARTRATE 5 MG PO TABS: 5.0000 mg | ORAL_TABLET | Freq: Every evening | ORAL | Status: DC | PRN

## 2016-03-05 MED ORDER — BENZOCAINE-MENTHOL 20-0.5 % EX AERO: 1.0000 "application " | INHALATION_SPRAY | CUTANEOUS | Status: DC | PRN

## 2016-03-05 MED ORDER — LACTATED RINGERS IV SOLN
INTRAVENOUS | Status: DC
  Administered 2016-03-06: 03:00:00 via INTRAVENOUS

## 2016-03-05 MED ORDER — OXYTOCIN 40 UNITS IN LACTATED RINGERS INFUSION - SIMPLE MED
1.0000 m[IU]/min | INTRAVENOUS | Status: DC
  Administered 2016-03-05: 4 m[IU]/min via INTRAVENOUS
  Administered 2016-03-05: 2 m[IU]/min via INTRAVENOUS

## 2016-03-05 MED ORDER — SIMETHICONE 80 MG PO CHEW: 80.0000 mg | CHEWABLE_TABLET | ORAL | Status: DC | PRN

## 2016-03-05 MED ORDER — IBUPROFEN 600 MG PO TABS
600.0000 mg | ORAL_TABLET | Freq: Four times a day (QID) | ORAL | Status: DC
  Administered 2016-03-05 – 2016-03-07 (×7): 600 mg via ORAL
  Filled 2016-03-05 (×7): qty 1

## 2016-03-05 MED ORDER — SENNOSIDES-DOCUSATE SODIUM 8.6-50 MG PO TABS
2.0000 | ORAL_TABLET | ORAL | Status: DC
  Administered 2016-03-05 – 2016-03-06 (×2): 2 via ORAL
  Filled 2016-03-05 (×2): qty 2

## 2016-03-05 MED ORDER — OXYCODONE-ACETAMINOPHEN 5-325 MG PO TABS: 2.0000 | ORAL_TABLET | ORAL | Status: DC | PRN

## 2016-03-05 MED ORDER — LACTATED RINGERS IV SOLN: 500.0000 mL | Freq: Once | INTRAVENOUS | Status: DC

## 2016-03-05 MED ORDER — PRENATAL MULTIVITAMIN CH
1.0000 | ORAL_TABLET | Freq: Every day | ORAL | Status: DC
  Administered 2016-03-06: 1 via ORAL
  Filled 2016-03-05: qty 1

## 2016-03-05 NOTE — Progress Notes (Signed)
   PRENATAL VISIT NOTE  Subjective:  Gabrielle Park is a 35 y.o. G4P3003 at 7581w5d being seen today for ongoing prenatal care.  She is currently monitored for the following issues for this high-risk pregnancy and has Supervision of high risk pregnancy, antepartum; Chronic hypertension complicating pregnancy, antepartum; Language barrier; and SGA (small for gestational age) on her problem list.  Patient reports headache.  Contractions: Irregular. Vag. Bleeding: None.  Movement: Present. Denies leaking of fluid.   The following portions of the patient's history were reviewed and updated as appropriate: allergies, current medications, past family history, past medical history, past social history, past surgical history and problem list. Problem list updated.  Objective:   Vitals:   03/05/16 0921  BP: (!) 182/99  Pulse: 60  Weight: 69.9 kg (154 lb 1.6 oz)    Fetal Status: Fetal Heart Rate (bpm): NST   Movement: Present     General:  Alert, oriented and cooperative. Patient is in no acute distress.  Skin: Skin is warm and dry. No rash noted.   Cardiovascular: Normal heart rate noted  Respiratory: Normal respiratory effort, no problems with respiration noted  Abdomen: Soft, gravid, appropriate for gestational age. Pain/Pressure: Present     Pelvic:  Cervical exam deferred        Extremities: Normal range of motion.  Edema: None  Mental Status: Normal mood and affect. Normal behavior. Normal judgment and thought content.   Assessment and Plan:  Pregnancy: G4P3003 at 4881w5d  1. Supervision of high risk pregnancy, antepartum Patient here for NST and FUP OB. At this time she is complaining of severe headache since last night, blurry vision and floating black spots. BP is elevated; patient sent immediately to [facility] for evaluation.   2. Chronic hypertension complicating or reason for care during pregnancy, third trimester  - Fetal nonstress test-Reactive  Term labor symptoms and  general obstetric precautions including but not limited to vaginal bleeding, contractions, leaking of fluid and fetal movement were reviewed in detail with the patient. Please refer to After Visit Summary for other counseling recommendations.  Return in about 6 weeks (around 04/16/2016) for PP visit.  IOL  11/30.   Gabrielle Park, CNM

## 2016-03-05 NOTE — Anesthesia Pain Management Evaluation Note (Signed)
  CRNA Pain Management Visit Note  Patient: Gabrielle Park, 35 y.o., female  "Hello I am a [facility]. We have an anesthesia team available at all times to provide care throughout the hospital, including epidural management and anesthesia for C-section. I don't know your plan for the delivery whether it a natural birth, water birth, IV sedation, nitrous supplementation, doula or epidural, but we want to meet your pain goals."   1.Was your pain managed to your expectations on prior hospitalizations?   Yes   2.What is your expectation for pain management during this hospitalization?     Epidural  3.How can we help you reach that goal? Epidural, when ready.  Record the patient's initial score and the patient's pain goal.   Pain: 5, current pain acceptable to patient  Pain Goal: 5 [facility] wants you to be able to say your pain was always managed very well.  Shelena Castelluccio L 03/05/2016     Spanish interpreter in room and utilized for pain consult note.

## 2016-03-05 NOTE — Progress Notes (Signed)
   03/05/16 1103  Vital Signs  BP (!) 173/126  Pulse Rate 67  Resp 18     03/05/16 1103  Vital Signs  BP (!) 173/126  Pulse Rate 67  Resp 18  20 MG of labetalol given SIVP

## 2016-03-05 NOTE — H&P (Signed)
LABOR AND DELIVERY ADMISSION HISTORY AND PHYSICAL NOTE  Gabrielle Park is a 35 y.o. female [phone] with IUP at 4532w5d by 6 wk US presenting for preeclampsia . Admits to visual disturbances and scotomatas. Denies RUQ/epigastric pain. Admits to 6 lb weight gain in last week.  She reports positive fetal movement. She denies leakage of fluid or vaginal bleeding.  Prenatal History/Complications:  Past Medical History: Past Medical History:  Diagnosis Date  . Hypertension   . Vaginal Pap smear, abnormal     Past Surgical History: Past Surgical History:  Procedure Laterality Date  . NO PAST SURGERIES      Obstetrical History: OB History    Gravida Para Term Preterm AB Living   4 3 3  0 0 3   SAB TAB Ectopic Multiple Live Births   0 0 0 0 3      Social History: Social History   Social History  . Marital status: Single    Spouse name: N/A  . Number of children: N/A  . Years of education: N/A   Social History Main Topics  . Smoking status: Never Smoker  . Smokeless tobacco: Never Used  . Alcohol use No     Comment: socially  . Drug use: No  . Sexual activity: Yes    Birth control/ protection: None     Comment: PARAGARD   Other Topics Concern  . Not on file   Social History Narrative  . No narrative on file    Family History: Family History  Problem Relation Age of Onset  . Diabetes Maternal Aunt   . Hypertension Mother     Allergies: No Known Allergies  Prescriptions Prior to Admission  Medication Sig Dispense Refill Last Dose  . aspirin EC 81 MG tablet Take 1 tablet (81 mg total) by mouth daily. Take after 12 weeks for prevention of preeclampsia later in pregnancy 300 tablet 2 Taking  . labetalol (NORMODYNE) 200 MG tablet Take 0.5 tablets (100 mg total) by mouth 2 (two) times daily. 60 tablet 2 Taking  . Prenatal Vit-Fe Phos-FA-Omega (VITAFOL GUMMIES) 3.33-0.333-34.8 MG CHEW Chew 1 tablet by mouth daily. (Patient not taking: Reported on 03/05/2016) 90  tablet 3 Not Taking     Review of Systems   All systems reviewed and negative except as stated in HPI  Blood pressure (!) 192/106, pulse 62, temperature 98.2 F (36.8 C), temperature source Oral, resp. rate 18, last menstrual period 05/29/2015, unknown if currently breastfeeding. General appearance: alert, cooperative and appears stated age Lungs: clear to auscultation bilaterally Heart: regular rate and rhythm Abdomen: soft, non-tender; bowel sounds normal Extremities: No calf swelling or tenderness Presentation: cephalic Fetal monitoring: Cat I tracing Uterine activity: q7-8/irregular CTX     Prenatal labs: ABO, Rh: --/--/O POS (04/04 1930) Antibody:   Rubella: !Error! RPR: NON REAC (09/11 0001)  HBsAg: Negative (06/12 0000)  HIV: NONREACTIVE (09/11 0001)  GBS:    1 hr Glucola: 105 3hr GTT-negative Genetic screening:  negative Anatomy US: normal  Prenatal Transfer Tool  Maternal Diabetes: No Genetic Screening: Normal Maternal Ultrasounds/Referrals: Abnormal:  Findings:   IUGR Fetal Ultrasounds or other Referrals:  None Maternal Substance Abuse:  No Significant Maternal Medications:  Meds include: Other: labetalol 100mg  BID Significant Maternal Lab Results: None  Results for orders placed or performed in visit on 03/05/16 (from the past 24 hour(s))  POCT urinalysis dip (device)   Collection Time: 03/05/16  9:15 AM  Result Value Ref Range   Glucose, UA NEGATIVE NEGATIVE  mg/dL   Bilirubin Urine NEGATIVE NEGATIVE   Ketones, ur NEGATIVE NEGATIVE mg/dL   Specific Gravity, Urine >=1.030 1.005 - 1.030   Hgb urine dipstick NEGATIVE NEGATIVE   pH 6.0 5.0 - 8.0   Protein, ur 30 (A) NEGATIVE mg/dL   Urobilinogen, UA 0.2 0.0 - 1.0 mg/dL   Nitrite NEGATIVE NEGATIVE   Leukocytes, UA SMALL (A) NEGATIVE    Patient Active Problem List   Diagnosis Date Noted  . Preeclampsia, severe 03/05/2016  . SGA (small for gestational age) 02/16/2016  . Language barrier 02/01/2016  .  Supervision of high risk pregnancy, antepartum 09/21/2015  . Chronic hypertension complicating pregnancy, antepartum 09/21/2015    Assessment: Gabrielle Park is a 35 y.o. G4P3003 at 6689w5d here for Pre-eclampsia with severe features  #Labor:Anticipate NSVD. Induction of labor with pitocin, given multigravity and favorable cervix #Pain: IV pain meds prn/ Epidural on request #FWB: Cat I tracing #ID:  GBS negative #MOF: Undecided #MOC:Paragard IUD #Circ:  Undecided #preeclampsia w/severe features- Will initiate hydralazine per preeclampsia protocol. Magnesium IV 4g bolus, 2g per hour. CBC wnl, CMP wnl Pr/Cr 0.108  WALLACE, NOAH I, DO PGY-3 03/05/2016, 10:37 AM  OB FELLOW HISTORY AND PHYSICAL ATTESTATION  I have seen and examined this patient; I agree with above documentation in the resident's note.    Ernestina Pennaicholas Collins Kerby 03/05/2016, 1:49 PM

## 2016-03-05 NOTE — Progress Notes (Signed)
   03/05/16 1737  Vital Signs  BP (!) 184/83  Pulse Rate 67  Resp 20   10mg  IV hydralazine given

## 2016-03-05 NOTE — MAU Note (Addendum)
Sent up from clinic for Pre-eclampsia eval.  6#wt gain this week, c/o headache and visual disturbances.  Denies epigastric pain, reports increase in swelling.

## 2016-03-05 NOTE — Progress Notes (Signed)
Video interpreter used for encounter.  Pt reports having a headache x2 days and today she also has blurry vision and is seeing some black spots in her field of vision. Pt will go to MAU for evaluation.

## 2016-03-05 NOTE — Progress Notes (Signed)
I stopped to check on patients need, I ordered her meals, I assisted RN Misty StanleyLisa with admission questions to the room and plan of care during the night time, also I was present with RN Cloe from Nursery with the explanation about Hepatitis B Vaccine for baby, by Orlan LeavensViria Alvarez Spanish Interpreter.

## 2016-03-05 NOTE — Progress Notes (Signed)
   03/05/16 1150  Vital Signs  BP (!) 170/90  Pulse Rate 64  Resp 18  Hydralazine 5 mg given SIVP

## 2016-03-05 NOTE — Progress Notes (Signed)
   03/05/16 1132  Vital Signs  BP (!) 163/85  Pulse Rate 63  Resp 18  Oxygen Therapy  SpO2 100 %  Mag bolus started 4 grams /20 mins

## 2016-03-06 ENCOUNTER — Ambulatory Visit (HOSPITAL_COMMUNITY)
Admission: RE | Admit: 2016-03-06 | Discharge: 2016-03-06 | Disposition: A | Discharge status: Home / Self Care | Payer: Self-pay | Source: Ambulatory Visit | Attending: Nurse Practitioner | Admitting: Nurse Practitioner

## 2016-03-06 ENCOUNTER — Encounter (HOSPITAL_COMMUNITY): Payer: Self-pay

## 2016-03-06 DIAGNOSIS — O365990 Maternal care for other known or suspected poor fetal growth, unspecified trimester, not applicable or unspecified: Secondary | ICD-10-CM

## 2016-03-06 DIAGNOSIS — Z3A38 38 weeks gestation of pregnancy: Secondary | ICD-10-CM

## 2016-03-06 DIAGNOSIS — O365930 Maternal care for other known or suspected poor fetal growth, third trimester, not applicable or unspecified: Secondary | ICD-10-CM

## 2016-03-06 DIAGNOSIS — O1414 Severe pre-eclampsia complicating childbirth: Secondary | ICD-10-CM

## 2016-03-06 LAB — CBC
HEMATOCRIT: 29.5 % — AB (ref 36.0–46.0)
HEMOGLOBIN: 10 g/dL — AB (ref 12.0–15.0)
MCH: 25.5 pg — ABNORMAL LOW (ref 26.0–34.0)
MCHC: 33.9 g/dL (ref 30.0–36.0)
MCV: 75.3 fL — AB (ref 78.0–100.0)
Platelets: 149 10*3/uL — ABNORMAL LOW (ref 150–400)
RBC: 3.92 MIL/uL (ref 3.87–5.11)
RDW: 18 % — AB (ref 11.5–15.5)
WBC: 11.7 10*3/uL — AB (ref 4.0–10.5)

## 2016-03-06 LAB — RPR: RPR Ser Ql: NONREACTIVE

## 2016-03-06 NOTE — Progress Notes (Signed)
Patient ID: Gabrielle Park, female   DOB: 07/26/1980, 35 y.o.   MRN: [id number]  POSTPARTUM PROGRESS NOTE  Post Partum Day #1 Subjective:  Gabrielle Doppraceli Park is a 35 y.o. [id number] 5032w5d s/p SVD.  No acute events overnight.  Pt denies problems with ambulating, voiding or po intake.  She denies nausea or vomiting.  Pain is well controlled.  She has had flatus. She has not had bowel movement.  Lochia Minimal. Denies CP/SOB, HA, RUQ/epigastric pain. Still complaining of some blurry vision.   Objective: Blood pressure 116/72, pulse 79, temperature 98.7 F (37.1 C), temperature source Oral, resp. rate 20, height 5' (1.524 m), weight 149 lb 2 oz (67.6 kg), last menstrual period 05/29/2015, SpO2 100 %, unknown if currently breastfeeding.  Physical Exam:  General: alert, cooperative and no distress Lochia:normal flow Chest: CTAB Heart: RRR no m/r/g Abdomen: +BS, soft, nontender Uterine Fundus: firm, below umbilicus DVT Evaluation: No calf swelling or tenderness Extremities: No edema   Recent Labs  03/05/16 1050 03/06/16 0532  HGB 11.7* 10.0*  HCT 35.3* 29.5*    Assessment/Plan:  ASSESSMENT: Gabrielle Doppraceli Park is a 35 y.o. [id number] 7432w5d s/p NSVD. -CHTN with super-imposed preeclampsia with severe features prior to delivery   Plan for discharge tomorrow and Contraception Paraguard IUD. Bottle feeding. Plans on outpatient circumcision.  Continue magnesium gtt until 17:30 tonight D/C Amlodipine (BPs 100s-110s/50-60s), was on HCTZ 12.5mg  qday prior to pregnancy, may consider adding if BPs become elevated.     LOS: 1 day   Jen MowElizabeth Mumaw, DO OB Fellow [facility]  03/06/2016, 7:10 AM

## 2016-03-06 NOTE — Progress Notes (Signed)
Interpreting services utilized for all communications by and between patient and hospital staff.

## 2016-03-07 ENCOUNTER — Inpatient Hospital Stay (HOSPITAL_COMMUNITY): Admission: RE | Admit: 2016-03-07 | Payer: Self-pay | Source: Ambulatory Visit

## 2016-03-07 MED ORDER — IBUPROFEN 600 MG PO TABS: 600.0000 mg | ORAL_TABLET | Freq: Four times a day (QID) | ORAL | 2 refills | Status: DC

## 2016-03-07 MED ORDER — HYDROCHLOROTHIAZIDE 12.5 MG PO TABS: 12.5000 mg | ORAL_TABLET | Freq: Every day | ORAL | 4 refills | Status: DC

## 2016-03-07 NOTE — Discharge Instructions (Signed)
Parto vaginal, cuidados posteriores  (Vaginal Delivery, Care After)  Siga estas instrucciones durante las próximas semanas. Estas indicaciones le proporcionan información acerca de cómo deberá cuidarse después del parto. El médico también podrá darle instrucciones más específicas. El tratamiento ha sido planificado según las prácticas médicas actuales, pero en algunos casos pueden ocurrir problemas. Llame al médico si tiene problemas o preguntas.  QUÉ ESPERAR DESPUÉS DEL PARTO  Después de un parto vaginal, es frecuente tener lo siguiente:  · Hemorragia leve de la vagina.  · Dolor en el abdomen, la vagina y la zona de la piel entre la abertura vaginal y el ano (perineo).  · Calambres pélvicos.  · Fatiga.  INSTRUCCIONES PARA EL CUIDADO EN EL HOGAR  Medicamentos  · Tome los medicamentos de venta libre y los recetados solamente como se lo haya indicado el médico.  · Si le recetaron un antibiótico, tómelo como se lo haya indicado el médico. No interrumpa la administración del antibiótico hasta que lo haya terminado.  Conducir  · No conduzca ni opere maquinaria pesada mientras toma analgésicos recetados.  · No conduzca durante 24 horas si le administraron un sedante.  Estilo de vida  · No beba alcohol. Esto es de suma importancia si está amamantando o toma analgésicos.  · No consuma productos que contengan tabaco, incluidos cigarrillos, tabaco de mascar o cigarrillos electrónicos. Si necesita ayuda para dejar de fumar, consulte al médico.  Comida y bebida  · Beba al menos 8 vasos de 8 onzas (240 cc) de agua todos los días a menos que el médico le indique lo contrario. Si elige amamantar al bebé, quizá deba beber aún más cantidad de agua.  · Ingiera alimentos ricos en fibras todos los días. Estos alimentos pueden ayudarla a prevenir o aliviar el estreñimiento. Los alimentos ricos en fibras incluyen, entre otros:  ? Panes y cereales integrales.  ? Arroz integral.  ? Frijoles.  ? Frutas y verduras  frescas.  Actividad  · Reanude sus actividades normales como se lo haya indicado el médico. Pregúntele al médico qué actividades son seguras para usted.  · Descanse todo lo que pueda. Trate de descansar o tomar una siesta mientras el bebé está durmiendo.  · No levante objetos que pesen más de 10 libras (4,5 kg) hasta que el médico le diga que es seguro hacerlo.  · Hable con el médico sobre cuándo puede volver a tener relaciones sexuales. Esto puede depender de lo siguiente:  ? Riesgo de sufrir infecciones.  ? Velocidad de cicatrización.  ? Comodidad y deseo de tener relaciones sexuales.  Cuidados vaginales  · Si le realizaron una episiotomía o tuvo un desgarro vaginal, contrólese la zona todos los días para detectar signos de infección. Esté atenta a los siguientes signos:  ? Aumento del enrojecimiento, la hinchazón o el dolor.  ? Más líquido o sangre.  ? Calor.  ? Pus o mal olor.  · No use tampones ni se haga duchas vaginales hasta que el médico la autorice.  · Controle la sangre que elimina por la vagina para detectar coágulos. Pueden tener el aspecto de grumos de color rojo oscuro, marrón o negro.  Instrucciones generales  · Mantenga el perineo limpio y seco, como se lo haya indicado el médico.  · Use ropa cómoda y suelta.  · Cuando vaya al baño, siempre higienícese de adelante hacia atrás.  · Pregúntele al médico si puede ducharse o tomar baños de inmersión. Si se le realizó una episiotomía o tuvo un desgarro perineal durante el trabajo del parto o   ayude con las tareas del hogar y a cuidar del beb durante al menos algunos das despus de salir del hospital.  Chauncy Passy a todas las visitas de seguimiento para usted y el beb, como se lo haya indicado el mdico. Esto es importante. SOLICITE ATENCIN MDICA SI:  Tiene los  siguientes sntomas:  Secrecin vaginal que tiene mal olor.  Dificultad para orinar.  Dolor al Beatrix Shipper.  Aumento o disminucin repentinos de la frecuencia con que defeca.  Ms enrojecimiento, hinchazn o dolor alrededor de la episiotoma o del desgarro vaginal.  Ms secrecin de lquido o sangre de la episiotoma o desgarro vaginal.  Pus o mal olor proveniente de la episiotoma o el desgarro vaginal.  Grant Ruts.  Erupcin cutnea.  Poco inters o falta de inters en actividades que solan gustarle.  Dudas sobre su cuidado y el del beb.  Siente la episiotoma o el desgarro vaginal caliente al tacto.  La episiotoma o el desgarro vaginal se est abriendo o no Adult nurse.  Siente dolor en las [place], o estn duras o enrojecidas.  Siente tristeza o preocupacin de forma inusual.  Siente nuseas o vomita.  Elimina cogulos grandes por la vagina. Si expulsa un cogulo sanguneo por la vagina, gurdelo para mostrrselo a su mdico. No tire la cadena sin que el mdico examine el cogulo antes.  Orina ms de lo habitual.  Se siente mareada o se desmaya.  No ha amamantado para nada y no ha tenido un perodo menstrual durante 12 semanas despus del [place].  Dej de amamantar al beb y no ha tenido su perodo menstrual durante 12 semanas despus de dejar de Museum/gallery exhibitions officer. SOLICITE ATENCIN MDICA DE INMEDIATO SI:  Tiene los siguientes sntomas:  Dolor que no desaparece o no mejora con Research scientist (medical).  Dolor en el pecho.  Dificultad para respirar.  Visin borrosa o Nurse, adult.  Pensamientos de autolesionarse o lesionar al beb.  Comienza a Psychiatrist abdomen o en una de las piernas.  Dolor de cabeza intenso.  Se desmaya.  Tiene una hemorragia tan intensa de la vagina que empapa dos toallitas sanitarias en [place]. Esta informacin no tiene Theme park manager el consejo del mdico. Asegrese de hacerle al mdico cualquier pregunta que  tenga. Document Released: 03/25/2005 Document Revised: 07/17/2015 Document Reviewed: 04/09/2015 Elsevier Interactive Patient Education  2017 Elsevier Inc.   Hipertensin durante el embarazo (Hypertension During Pregnancy) Cuando se sufre hipertensin arterial o presin arterial alta, existe una presin extra en el interior de los vasos sanguneos que llevan la sangre desde el corazn al resto del cuerpo (arterias). Esto puede suceder en cualquier etapa de la vida, incluido el embarazo. La hipertensin durante el embarazo puede causar problemas para usted y el beb. Es posible que el beb no tenga el peso adecuado al nacer o puede que nazca antes de tiempo (prematuro). En los casos muy graves de hipertensin durante el embarazo puede estar en peligro la vida.  Durante el embarazo se pueden presentar diferentes tipos de hipertensin arterial. Estos incluyen:  Hipertensin crnica. Esto sucede cuando una mujer sufre de hipertensin arterial antes del Psychiatrist y Careers information officer.  Hipertensin gestacional. Es cuando se desarrolla la hipertensin Academic librarian.  Preeclampsia o toxemia del embarazo. Es un tipo muy grave de hipertensin que se desarrolla solo durante el [place]. Afecta a todo el cuerpo y puede ser muy peligrosa para la madre y el beb. La hipertensin gestacional y preeclampsia por lo general, desaparecen despus del  nacimiento del beb. La presin arterial probablemente se estabilizar en un perodo de 6 semanas. Las mujeres que sufren de hipertensin durante el embarazo tienen una mayor probabilidad de Environmental education officerdesarrollar hipertensin en etapas posteriores de la vida o en embarazos futuros. FACTORES DE RIESGO Existen ciertos factores que aumentan las probabilidades de que desarrolle hipertensin durante el [place]. Estos incluyen:  Tener hipertensin arterial antes del embarazo.  Haber sufrido hipertensin arterial durante un embarazo anterior.  Tener sobrepeso.  Ser  mayor de 40 aos.  Estar embarazada de ms de un beb.  Tener diabetes o problemas renales. SIGNOS Y SNTOMAS La hipertensin arterial gestacional y crnica en raras ocasiones provoca sntomas. La preeclampsia causa sntomas, que pueden ser:  Aumento de las protenas en la orina. El Facilities managermdico la controlar en cada visita prenatal.  Hinchazn de las manos y la cara.  Aumento rpido de [place]peso.  Dolores de Turkmenistancabeza.  Cambios en la visin.  Molestias al ver la luz.  Dolor abdominal, especialmente en el rea superior derecha.  Dolor en el pecho.  Falta de aire.  Aumento de los reflejos.  Convulsiones. Las convulsiones ocurren en una forma ms grave de preeclampsia, llamada eclampsia. DIAGNSTICO  Es posible que se le diagnostique hipertensin arterial durante un control prenatal regular. En cada visita prenatal, es posible que le realicen los siguientes exmenes:  Control de la presin arterial.  Anlisis de orina para detectar protenas en la orina. El tipo de hipertensin que se diagnostica depende del momento en que se desarroll. Tambin depende de la lectura de su presin arterial especfica.  El desarrollo de hipertensin arterial antes de lasemana 20 de embarazo es consecuente con la hipertensin arterial crnica.  El desarrollo de hipertensin arterial despus de la semana 20 de embarazo es consecuente con la hipertensin gestacional.  Hipertensin con aumento de la protena urinaria se diagnostica como preeclampsia.  Las mediciones de la presin arterial de ms de 160 sistlica o 110 diastlica son un signo de preeclampsia grave. TRATAMIENTO El tratamiento para la hipertensin durante el embarazo vara. Depende del tipo de hipertensin y de su gravedad.  Si toma medicamentos para la hipertensin crnica, puede que tenga que cambiarlos.  Los medicamentos llamados inhibidores de la ECA no deben tomarse Academic librariandurante el embarazo.  Para las mujeres que tienen factores de riesgo  de preeclampsia pueden recomendarse bajas dosis de aspirina.  Si usted tiene Occupational hygienisthipertensin gestacional, tendr que tomar un medicamento para la presin arterial que sea seguro durante el [place]bar[place]. El Market researchermdico le recomendar el medicamento apropiado.  Si tiene preeclampsia grave, es posible que tenga que Engineer, maintenancepermanecer en el hospital. Los mdicos la controlarn a usted y al beb muy de cerca. Probablemente deba tomar un medicamento denominado sulfato de magnesio para prevenir las convulsiones y reducir la presin arterial.  A veces es necesario inducir un parto prematuro. Por ejemplo, si la afeccin empeora. Se hace para protegerlos a usted y a su beb. La nica cura para la preeclampsia es el parto.  Su mdico puede recomendarle que tome una aspirina de dosis baja (81mg ) cada da, a fin de ayudar a prevenir la hipertensin durante el embarazo, si est en riesgo de padecer preeclampsia. Puede estar en riesgo de padecer preeclampsia si:  Padeci preeclampsia o eclampsia durante un embarazo anterior.  Su beb no creci segn lo previsto durante un embarazo anterior.  Tuvo un parto prematuro en un embarazo anterior.  Experiment una separacin de la placenta desde el tero (desprendimiento abrupto de la placenta) durante un embarazo anterior.  Perdi un beb en un embarazo anterior.  Est embarazada de ms de un beb.  Padece otras afecciones mdicas, como diabetes o una enfermedad autoinmunitaria. INSTRUCCIONES PARA EL CUIDADO EN EL HOGAR  Programe y concurra a todas las citas de control prenatal regulares. Esto es importante.  Tome los medicamentos solamente como se lo haya indicado el mdico. Dgale a su mdico todos los medicamentos que toma.  Consuma la menor cantidad posible de sal.  Realice actividad fsica con regularidad.  No beba alcohol.  No fume ni use productos que contengan tabaco.  No beba productos con cafena.  Acustese sobre el lado izquierdo cuando haga  reposo. SOLICITE ATENCIN MDICA DE INMEDIATO SI:  Siente un dolor abdominal intenso.  Presenta hinchazn repentina en las manos, los tobillos o el rostro.  Aumenta ms de 4 libras (1,8 kg) en una semana.  Vomita repetidas veces.  Tiene una hemorragia vaginal abundante.  No siente que el beb se mueva mucho.  Tiene dolores de Turkmenistancabeza.  Tiene visin doble o borrosa.  Tiene calambres o espasmos musculares.  Le falta el aire.  Tiene los labios y las uas de los dedos de las manos de Edison Internationalcolor azul.  Observa sangre en la orina. ASEGRESE DE QUE:  Comprende estas instrucciones.  Controlar su afeccin.  Recibir ayuda de inmediato si no mejora o si empeora. Esta informacin no tiene Theme park managercomo fin reemplazar el consejo del mdico. Asegrese de hacerle al mdico cualquier pregunta que tenga. Document Released: 03/14/2011 Document Revised: 04/15/2014 Elsevier Interactive Patient Education  2017 [employer].

## 2016-03-07 NOTE — Progress Notes (Signed)
Cerclage out. VE by Dr. Estanislado Pandyivard. Pt is 4 cm. Gabrielle DaneERRI L Selma Rodelo, RN

## 2016-03-07 NOTE — Progress Notes (Signed)
Pt given written discharge instructions and reviewed them with nurse and interpreter. Pt verbalized and understanding through interpreter. Carmelina DaneERRI L Welborn Keena, RN

## 2016-03-07 NOTE — Lactation Note (Signed)
This note was copied from a baby's chart. Lactation Consultation Note  [name] JXBJY'NToday's Date: 03/07/2016  Per RN, Mom is bottle/formula feeding only.    Maternal Data    Feeding Feeding Type: Bottle Fed - Formula Nipple Type: Slow - flow  LATCH Score/Interventions                      Lactation Tools Discussed/Used     Consult Status      Gabrielle Park, Gabrielle Park 03/07/2016, 9:30 AM

## 2016-03-07 NOTE — Discharge Summary (Signed)
OB Discharge Summary     [name] DOB: 09/07/1980 MRN: [id number]  Date of admission: 03/05/2016 Delivering MD: Lorne SkeensSCHENK, NICHOLAS MICHAEL   Date of discharge: 03/07/2016  Admitting diagnosis: 38WKS, PREECLAMPSIA Intrauterine pregnancy: 4537w5d     Secondary diagnosis:  Active Problems:   Preeclampsia, severe  Additional problems: IUGR     Discharge diagnosis: Term Pregnancy Delivered and Preeclampsia (severe)                                                                                                Post partum procedures:Magnesium sulfate x 24 hours for eclampsia prophylaxis  Augmentation: Pitocin  Complications: None  Hospital course:  Induction of Labor With Vaginal Delivery   35 y.o. yo [id number] at 4037w5d was admitted to the hospital 03/05/2016 for induction of labor.  Indication for induction: Severe Preeclampsia.  Patient had an uncomplicated labor course as follows: Membrane Rupture Time/Date: 5:17 PM ,03/05/2016   Intrapartum Procedures: Episiotomy: None [1]                                         Lacerations:  1st degree [2]  Patient had delivery of Park Viable infant.  Information for the patient's newborn:  Gabrielle Park, Boy Gabrielle Park [[id number]][[id number]]  Delivery Method: Vag-Spont   Details of delivery can be found in separate delivery note.  Had magnesium sulfate for 24 hours, BP remained stable. Restarted on pre-pregnancy HCTZ 12.5 mg.  Patient had Park routine postpartum course. Patient is discharged home 03/07/16.   Physical exam Vitals:   03/06/16 1700 03/06/16 1710 03/06/16 2020 03/07/16 0401  BP:  120/72 (!) 110/59 127/74  Pulse:  68 78 67  Resp:  20 18 18   Temp:  98.4 F (36.9 C) 98.4 F (36.9 C) 97.7 F (36.5 C)  TempSrc:  Oral Oral Oral  SpO2: 99%  100% 100%  Weight:      Height:       General: alert and no distress Lochia: appropriate Uterine Fundus: firm DVT Evaluation: No evidence of DVT seen on physical exam. Negative  Homan's sign. No cords or calf tenderness. Labs: Lab Results  Component Value Date   WBC 11.7 (H) 03/06/2016   HGB 10.0 (L) 03/06/2016   HCT 29.5 (L) 03/06/2016   MCV 75.3 (L) 03/06/2016   PLT 149 (L) 03/06/2016   CMP Latest Ref Rng & Units 03/05/2016  Glucose 65 - 99 mg/dL 92  BUN 6 - 20 mg/dL 13  Creatinine 9.620.44 - 9.521.00 mg/dL 8.410.58  Sodium 324135 - 401145 mmol/L 136  Potassium 3.5 - 5.1 mmol/L 3.8  Chloride 101 - 111 mmol/L 108  CO2 22 - 32 mmol/L 20(L)  Calcium 8.9 - 10.3 mg/dL 0.2(V8.5(L)  Total Protein 6.5 - 8.1 g/dL 6.2(L)  Total Bilirubin 0.3 - 1.2 mg/dL 2.5(D0.2(L)  Alkaline Phos 38 - 126 U/L 171(H)  AST 15 - 41 U/L 27  ALT 14 - 54 U/L 17    Discharge instruction: per After Visit Summary and "Baby  and Me Booklet".  After visit meds:    Medication List    STOP taking these medications   aspirin EC 81 MG tablet   labetalol 200 MG tablet Commonly known as:  NORMODYNE     TAKE these medications   hydrochlorothiazide 12.5 MG tablet Commonly known as:  HYDRODIURIL Take 1 tablet (12.5 mg total) by mouth daily.   ibuprofen 600 MG tablet Commonly known as:  ADVIL,MOTRIN Take 1 tablet (600 mg total) by mouth every 6 (six) hours.   VITAFOL GUMMIES 3.33-0.333-34.8 MG Chew Chew 1 tablet by mouth daily.       Diet: routine diet  Activity: Advance as tolerated. Pelvic rest for 6 weeks.   Outpatient follow up:1 week BP check  Postpartum contraception: IUD Paragard  Newborn Data: Live born female  Birth Weight: 5 lb 6.2 oz (2444 g) APGAR: 6, 8  Baby Feeding: Breast Disposition:rooming in due to concerns about weight   03/07/2016 Gabrielle Park,Gabrielle Burback A, MD

## 2016-03-13 ENCOUNTER — Other Ambulatory Visit (HOSPITAL_COMMUNITY): Payer: Self-pay

## 2016-03-20 ENCOUNTER — Other Ambulatory Visit (HOSPITAL_COMMUNITY): Payer: Self-pay

## 2016-04-16 ENCOUNTER — Ambulatory Visit: Payer: Self-pay | Admitting: Advanced Practice Midwife

## 2017-04-06 IMAGING — US US OB COMP LESS 14 WK
1 series · 15 of 28 positions shown · non-contrast
Comparison: None.

CLINICAL DATA: Lower abdominal pain for 1 week.

EXAM:
OBSTETRIC <14 WK US AND TRANSVAGINAL OB US
TECHNIQUE: Both transabdominal and transvaginal ultrasound examinations were
performed for complete evaluation of the gestation as well as the
maternal uterus, adnexal regions, and pelvic cul-de-sac.
Transvaginal technique was performed to assess early pregnancy.

[Series 1: us ob comp less 14 wk · 15 of 36 slices shown]
[im 1/36]
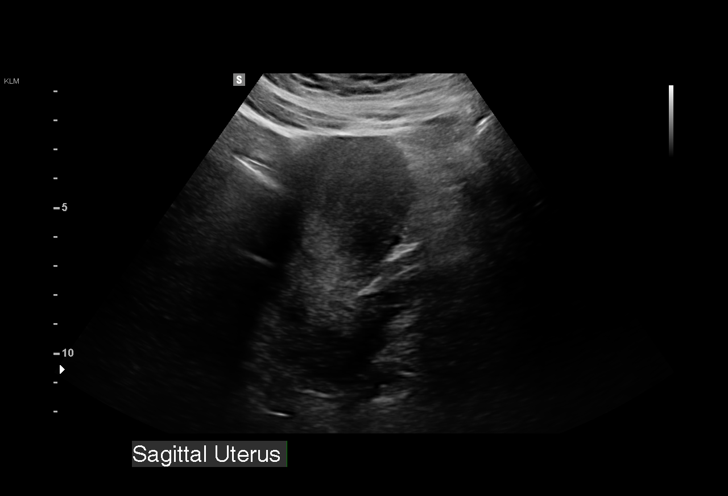
[im 3/36]
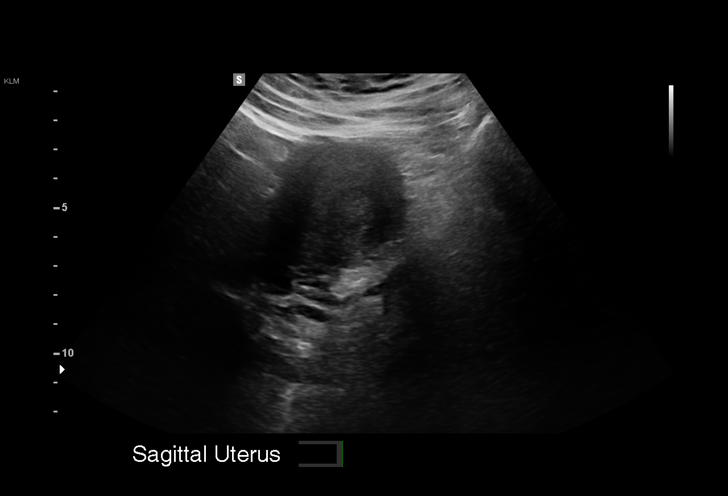
[im 6/36]
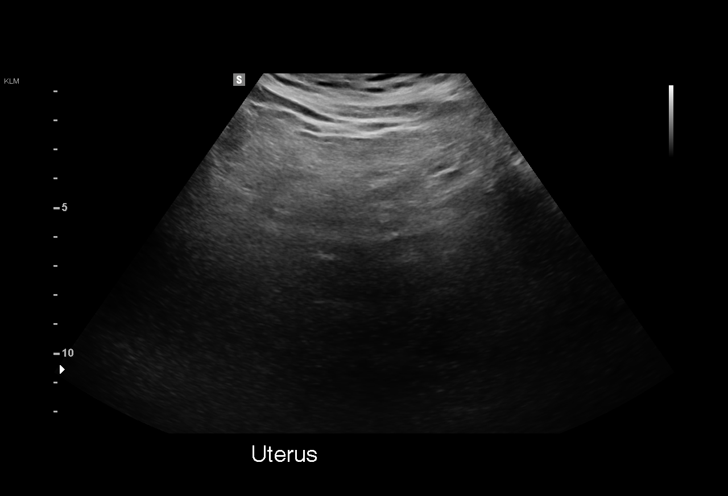
[im 8/36]
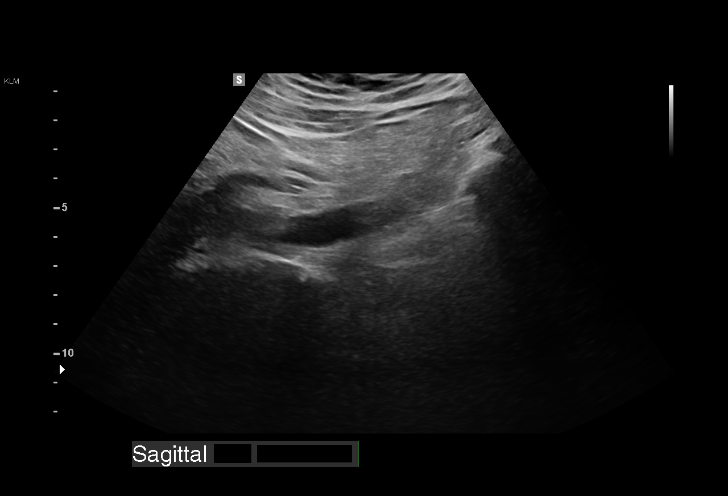
[im 11/36]
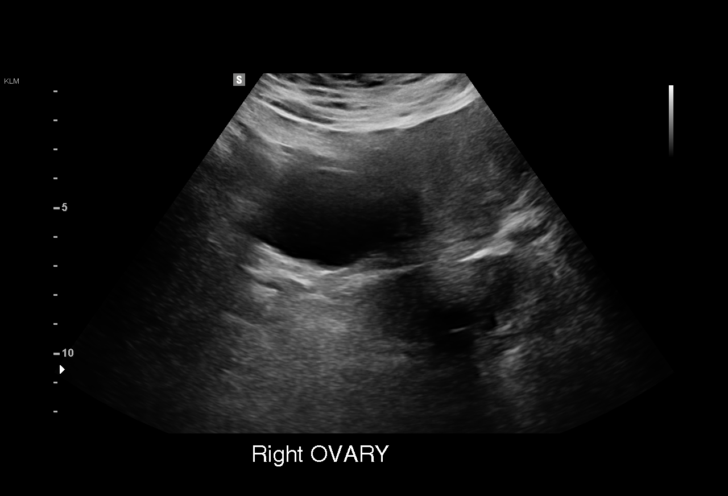
[im 13/36]
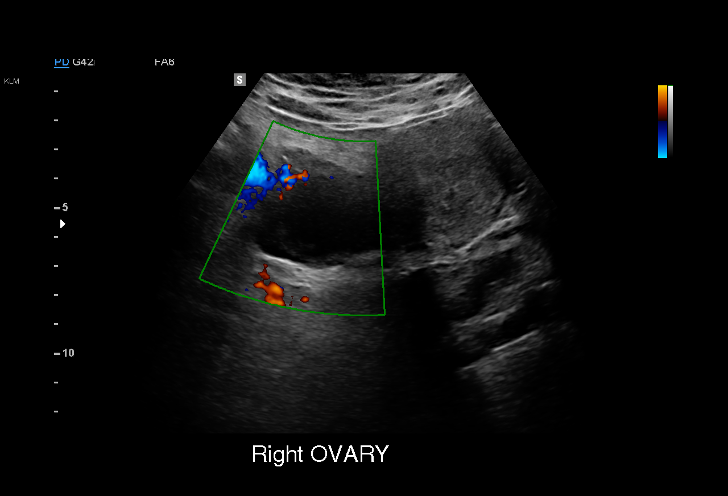
[im 16/36]
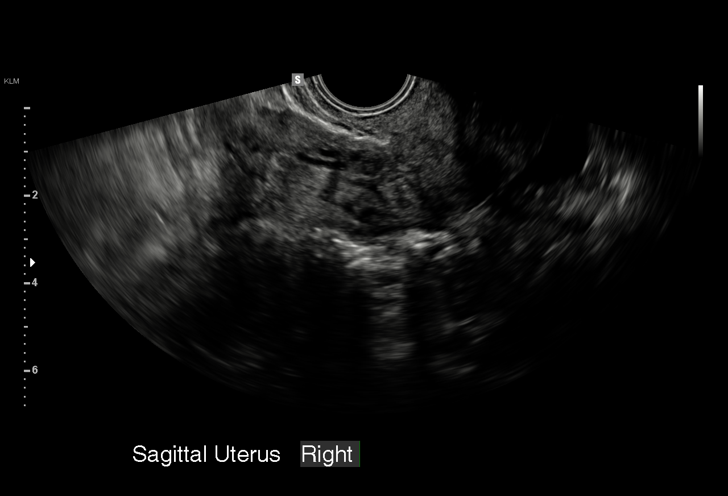
[im 19/36]
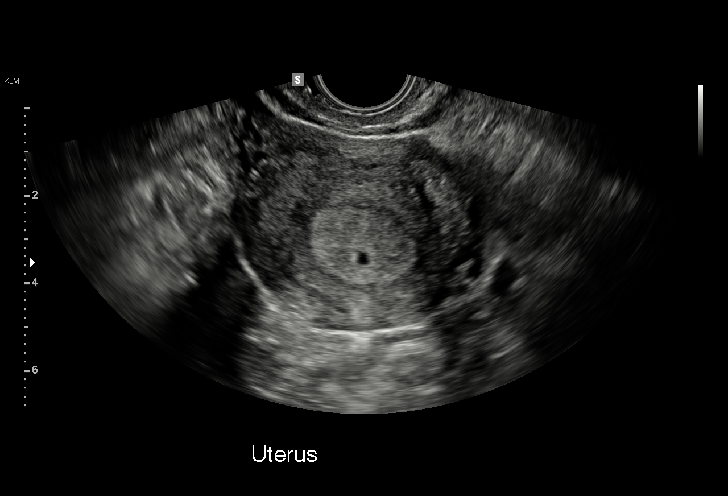
[im 20/36]
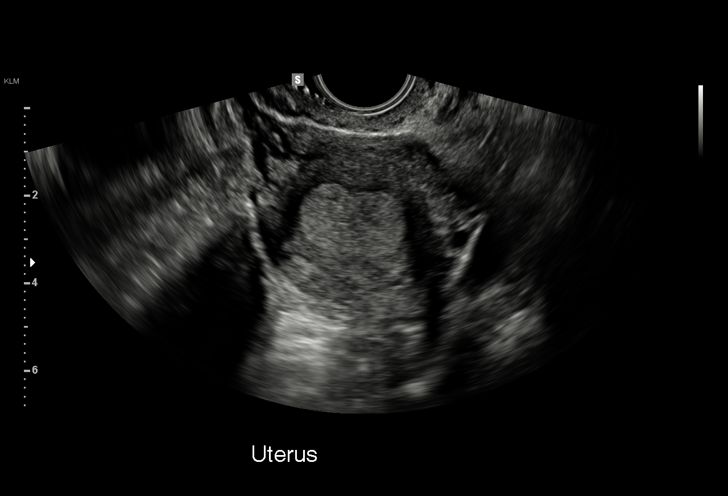
[im 23/36]
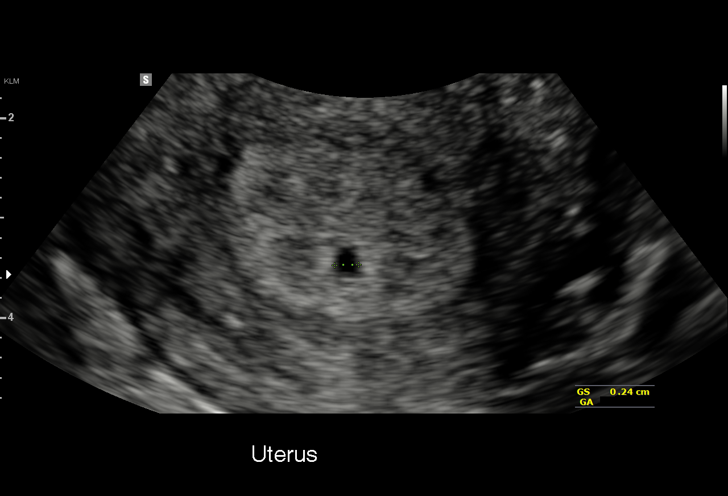
[im 25/36]
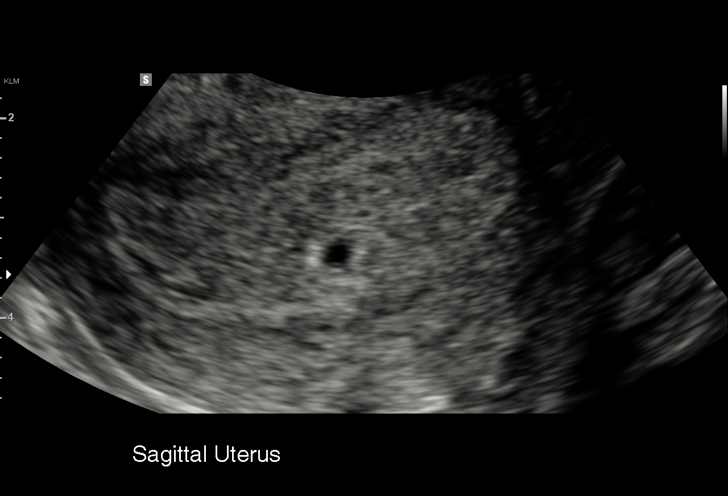
[im 28/36]
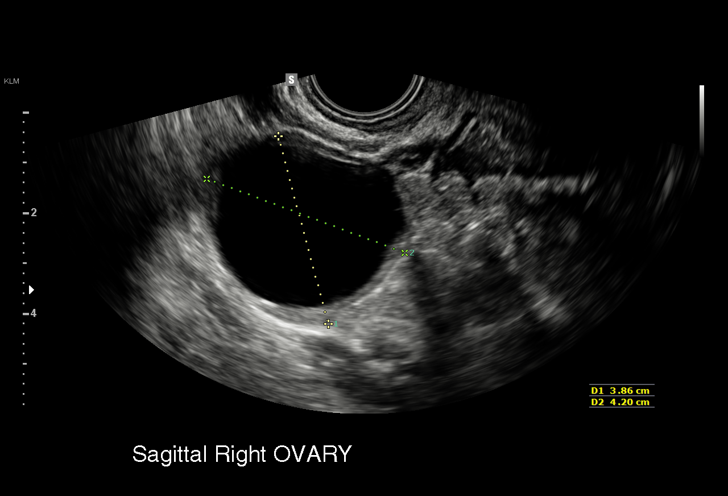
[im 30/36]
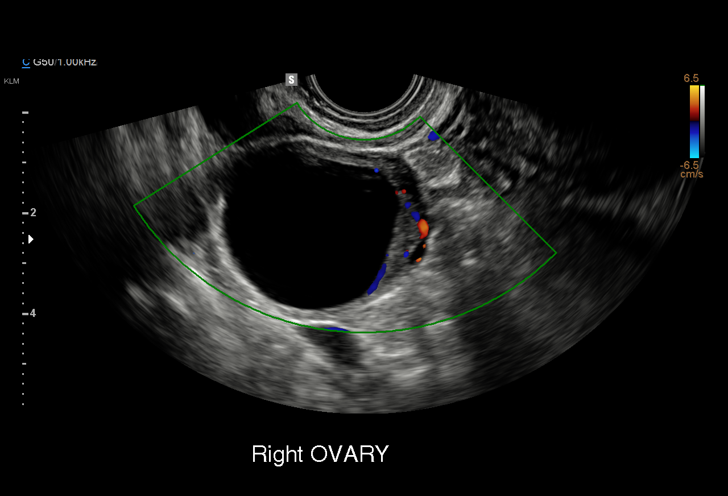
[im 33/36]
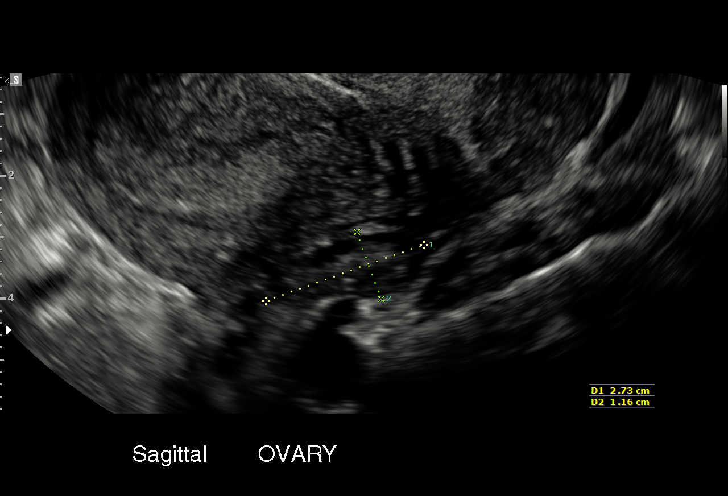
[im 36/36]
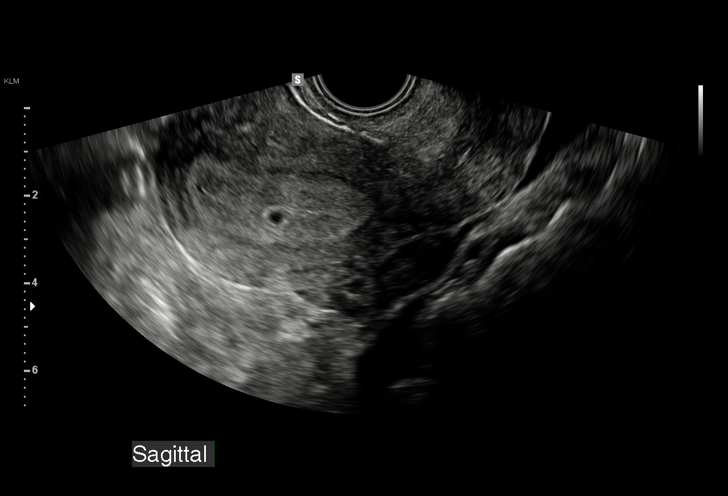

[15 of 28 positions shown; findings below may reference images not displayed]

FINDINGS: Intrauterine gestational sac: Single

Yolk sac:  No

Embryo:  No

Cardiac Activity: No

Heart Rate:   bpm

MSD: 2.8  mm   5 w   0  d

CRL:    mm    w    d                  US EDC:

Subchorionic hemorrhage:  None visualized.

Maternal uterus/adnexae: 4 cm simple cyst in the right adnexal
region. No complex or solid adnexal masses. No free pelvic fluid.
IMPRESSION: Probable early intrauterine gestational sac, but no yolk sac, fetal
pole, or cardiac activity yet visualized. Recommend follow-up
quantitative B-HCG levels and follow-up US in 14 days to confirm and
assess viability. This recommendation follows SRU consensus
guidelines: Diagnostic Criteria for Nonviable Pregnancy Early in the
First Trimester. N Engl J Med 2762; [DATE].

## 2017-10-31 ENCOUNTER — Other Ambulatory Visit: Payer: Self-pay

## 2017-10-31 ENCOUNTER — Other Ambulatory Visit: Payer: Self-pay | Admitting: Nurse Practitioner

## 2017-10-31 DIAGNOSIS — R300 Dysuria: Secondary | ICD-10-CM

## 2018-09-23 ENCOUNTER — Emergency Department (HOSPITAL_COMMUNITY): Payer: Self-pay

## 2018-09-23 ENCOUNTER — Other Ambulatory Visit: Payer: Self-pay

## 2018-09-23 ENCOUNTER — Encounter (HOSPITAL_COMMUNITY): Payer: Self-pay | Admitting: Emergency Medicine

## 2018-09-23 ENCOUNTER — Emergency Department (HOSPITAL_COMMUNITY)
Admission: EM | Admit: 2018-09-23 | Discharge: 2018-09-23 | Disposition: A | Discharge status: Home / Self Care | Payer: Self-pay | Attending: Emergency Medicine | Admitting: Emergency Medicine

## 2018-09-23 DIAGNOSIS — R200 Anesthesia of skin: Secondary | ICD-10-CM | POA: Insufficient documentation

## 2018-09-23 DIAGNOSIS — M5412 Radiculopathy, cervical region: Secondary | ICD-10-CM | POA: Insufficient documentation

## 2018-09-23 DIAGNOSIS — I10 Essential (primary) hypertension: Secondary | ICD-10-CM | POA: Insufficient documentation

## 2018-09-23 LAB — CBC WITH DIFFERENTIAL/PLATELET
Abs Immature Granulocytes: 0.01 10*3/uL (ref 0.00–0.07)
Basophils Absolute: 0 10*3/uL (ref 0.0–0.1)
Basophils Relative: 1 %
Eosinophils Absolute: 0.4 10*3/uL (ref 0.0–0.5)
Eosinophils Relative: 6 %
HCT: 33 % — ABNORMAL LOW (ref 36.0–46.0)
Hemoglobin: 9.1 g/dL — ABNORMAL LOW (ref 12.0–15.0)
Immature Granulocytes: 0 %
Lymphocytes Relative: 35 %
Lymphs Abs: 2.2 10*3/uL (ref 0.7–4.0)
MCH: 18.6 pg — ABNORMAL LOW (ref 26.0–34.0)
MCHC: 27.6 g/dL — ABNORMAL LOW (ref 30.0–36.0)
MCV: 67.6 fL — ABNORMAL LOW (ref 80.0–100.0)
Monocytes Absolute: 0.4 10*3/uL (ref 0.1–1.0)
Monocytes Relative: 6 %
Neutro Abs: 3.2 10*3/uL (ref 1.7–7.7)
Neutrophils Relative %: 52 %
Platelets: 235 10*3/uL (ref 150–400)
RBC: 4.88 MIL/uL (ref 3.87–5.11)
RDW: 20.2 % — ABNORMAL HIGH (ref 11.5–15.5)
WBC: 6.1 10*3/uL (ref 4.0–10.5)
nRBC: 0 % (ref 0.0–0.2)

## 2018-09-23 LAB — COMPREHENSIVE METABOLIC PANEL
ALT: 18 U/L (ref 0–44)
AST: 20 U/L (ref 15–41)
Albumin: 3.8 g/dL (ref 3.5–5.0)
Alkaline Phosphatase: 76 U/L (ref 38–126)
Anion gap: 7 (ref 5–15)
BUN: 7 mg/dL (ref 6–20)
CO2: 24 mmol/L (ref 22–32)
Calcium: 8.9 mg/dL (ref 8.9–10.3)
Chloride: 107 mmol/L (ref 98–111)
Creatinine, Ser: 0.58 mg/dL (ref 0.44–1.00)
GFR calc Af Amer: >60 mL/min (ref >60)
GFR calc non Af Amer: >60 mL/min (ref >60)
Glucose, Bld: 102 mg/dL — ABNORMAL HIGH (ref 70–99)
Potassium: 3.9 mmol/L (ref 3.5–5.1)
Sodium: 138 mmol/L (ref 135–145)
Total Bilirubin: 0.5 mg/dL (ref 0.3–1.2)
Total Protein: 7 g/dL (ref 6.5–8.1)

## 2018-09-23 LAB — URINALYSIS, ROUTINE W REFLEX MICROSCOPIC
Bilirubin Urine: NEGATIVE
Glucose, UA: NEGATIVE mg/dL
Hgb urine dipstick: NEGATIVE
Ketones, ur: 5 mg/dL — AB
Leukocytes,Ua: NEGATIVE
Nitrite: NEGATIVE
Protein, ur: NEGATIVE mg/dL
Specific Gravity, Urine: 1.016 (ref 1.005–1.030)
pH: 5 (ref 5.0–8.0)

## 2018-09-23 LAB — TSH: TSH: 0.912 u[IU]/mL (ref 0.350–4.500)

## 2018-09-23 LAB — PREGNANCY, URINE: Preg Test, Ur: NEGATIVE

## 2018-09-23 MED ORDER — KETOROLAC TROMETHAMINE 30 MG/ML IJ SOLN
30.0000 mg | Freq: Once | INTRAMUSCULAR | Status: AC
  Administered 2018-09-23: 10:00:00 30 mg via INTRAMUSCULAR
  Filled 2018-09-23: qty 1

## 2018-09-23 MED ORDER — HYDROCODONE-ACETAMINOPHEN 5-325 MG PO TABS: 1.0000 | ORAL_TABLET | ORAL | 0 refills | Status: DC | PRN

## 2018-09-23 MED ORDER — IBUPROFEN 600 MG PO TABS: 600.0000 mg | ORAL_TABLET | Freq: Four times a day (QID) | ORAL | 0 refills | Status: DC | PRN

## 2018-09-23 MED ORDER — DEXAMETHASONE SODIUM PHOSPHATE 10 MG/ML IJ SOLN
10.0000 mg | Freq: Once | INTRAMUSCULAR | Status: AC
Start: 2018-09-23 — End: 2018-09-23
  Administered 2018-09-23: 10:00:00 10 mg via INTRAMUSCULAR
  Filled 2018-09-23: qty 1

## 2018-09-23 MED ORDER — PREDNISONE 10 MG (21) PO TBPK: ORAL_TABLET | ORAL | 0 refills | Status: DC

## 2018-09-23 NOTE — ED Provider Notes (Signed)
[facility] Provider Note   CSN: [id number] Arrival date & time: 09/23/18  [id number]    History   Chief Complaint Chief Complaint  Patient presents with  . Arm Pain    HPI Gabrielle Park is a 38 y.o. female.     Pt presents to the ED today with arm numbness.  Pt said she woke up with right arm numbness and pain yesterday morning.  She also has pain to the left arm.  She feels it most below her elbows.  She also feels a little pain in her left foot.  Pt took 2 tylenol which helped the pain.  She does have htn, but did not take her medicine this morning.  She has not had her period in 2 months.  She takes a "tea" for birth control.  The pt denies difficulty walking, talking.  No sob or cough.  No known covid exposures.   Due to language barrier, a video interpreter was present during the history-taking and subsequent discussion (and for part of the physical exam) with this patient.     Past Medical History:  Diagnosis Date  . Hypertension   . Vaginal Pap smear, abnormal     Patient Active Problem List   Diagnosis Date Noted  . Preeclampsia, severe 03/05/2016  . Chronic hypertension complicating or reason for care during pregnancy, third trimester 03/05/2016  . SGA (small for gestational age) 02/16/2016  . Language barrier 02/01/2016  . Supervision of high risk pregnancy, antepartum 09/21/2015  . Chronic hypertension complicating pregnancy, antepartum 09/21/2015    Past Surgical History:  Procedure Laterality Date  . NO PAST SURGERIES       OB History    Gravida  4   Para  4   Term  4   Preterm  0   AB  0   Living  4     SAB  0   TAB  0   Ectopic  0   Multiple  0   Live Births  4            Home Medications    Prior to Admission medications   Medication Sig Start Date End Date Taking? Authorizing Provider  acetaminophen (TYLENOL) 500 MG tablet Take 1,000 mg by mouth every 6 (six) hours as needed  for mild pain or moderate pain.   Yes [provider]  hydrochlorothiazide (HYDRODIURIL) 12.5 MG tablet Take 1 tablet (12.5 mg total) by mouth daily. Patient taking differently: Take 25 mg by mouth daily.  03/07/16  Yes Anyanwu, Jethro BastosUgonna A, MD  HYDROcodone-acetaminophen (NORCO/VICODIN) 5-325 MG tablet Take 1 tablet by mouth every 4 (four) hours as needed. 09/23/18   Jacalyn LefevreHaviland, Johnavon Mcclafferty, MD  ibuprofen (ADVIL) 600 MG tablet Take 1 tablet (600 mg total) by mouth every 6 (six) hours as needed. 09/23/18   Jacalyn LefevreHaviland, Ociel Retherford, MD  predniSONE (STERAPRED UNI-PAK 21 TAB) 10 MG (21) TBPK tablet Take 6 tabs for 2 days, then 5 for 2 days, then 4 for 2 days, then 3 for 2 days, 2 for 2 days, then 1 for 2 days 09/23/18   Jacalyn LefevreHaviland, Nitisha Civello, MD  Prenatal Vit-Fe Phos-FA-Omega (VITAFOL GUMMIES) 3.33-0.333-34.8 MG CHEW Chew 1 tablet by mouth daily. Patient not taking: Reported on 09/23/2018 01/25/16   Hermina StaggersErvin, Michael L, MD    Family History Family History  Problem Relation Age of Onset  . Diabetes Maternal Aunt   . Hypertension Mother     Social History Social History  Tobacco Use  . Smoking status: Never Smoker  . Smokeless tobacco: Never Used  Substance Use Topics  . Alcohol use: Yes    Comment: socially  . Drug use: No     Allergies   Patient has no known allergies.   Review of Systems Review of Systems  Musculoskeletal:       Right arm numbness/pain; left arm pain  All other systems reviewed and are negative.    Physical Exam Updated Vital Signs BP (!) 158/81 (BP Location: Left Arm)   Pulse 64   Temp 98.4 F (36.9 C) (Oral)   Resp 16   LMP 07/25/2018 (Approximate)   SpO2 99%   Physical Exam Vitals signs and nursing note reviewed.  Constitutional:      Appearance: Normal appearance.  HENT:     Head: Normocephalic and atraumatic.     Right Ear: External ear normal.     Left Ear: External ear normal.     Nose: Nose normal.     Mouth/Throat:     Mouth: Mucous membranes are moist.      Pharynx: Oropharynx is clear.  Eyes:     Extraocular Movements: Extraocular movements intact.     Conjunctiva/sclera: Conjunctivae normal.     Pupils: Pupils are equal, round, and reactive to light.  Neck:     Musculoskeletal: Normal range of motion and neck supple.  Cardiovascular:     Rate and Rhythm: Normal rate and regular rhythm.     Pulses: Normal pulses.     Heart sounds: Normal heart sounds.  Pulmonary:     Effort: Pulmonary effort is normal.  Abdominal:     General: Abdomen is flat. Bowel sounds are normal.     Palpations: Abdomen is soft.  Musculoskeletal: Normal range of motion.  Skin:    General: Skin is warm.     Capillary Refill: Capillary refill takes less than 2 seconds.  Neurological:     General: No focal deficit present.     Mental Status: She is alert and oriented to person, place, and time.  Psychiatric:        Mood and Affect: Mood normal.        Behavior: Behavior normal.      ED Treatments / Results  Labs (all labs ordered are listed, but only abnormal results are displayed) Labs Reviewed  COMPREHENSIVE METABOLIC PANEL - Abnormal; Notable for the following components:      Result Value   Glucose, Bld 102 (*)    All other components within normal limits  CBC WITH DIFFERENTIAL/PLATELET - Abnormal; Notable for the following components:   Hemoglobin 9.1 (*)    HCT 33.0 (*)    MCV 67.6 (*)    MCH 18.6 (*)    MCHC 27.6 (*)    RDW 20.2 (*)    All other components within normal limits  URINALYSIS, ROUTINE W REFLEX MICROSCOPIC - Abnormal; Notable for the following components:   Ketones, ur 5 (*)    All other components within normal limits  PREGNANCY, URINE  TSH    EKG None  Radiology Dg Cervical Spine Complete  Result Date: 09/23/2018 CLINICAL DATA:  Neck pain, right shoulder and arm pain. EXAM: CERVICAL SPINE - COMPLETE 4+ VIEW COMPARISON:  None. FINDINGS: The cervical vertebral bodies are normally aligned. Disc spaces and vertebral  bodies are maintained. No significant degenerative changes. No acute bony findings or abnormal prevertebral soft tissue swelling. The facets are normally aligned. The neural foramen are patent.  The C1-2 articulations are maintained. The lung apices are clear. IMPRESSION: Normal alignment and no acute bony findings or degenerative changes. Electronically Signed   By: Rudie MeyerP.  Gallerani M.D.   On: 09/23/2018 09:51    Procedures Procedures (including critical care time)  Medications Ordered in ED Medications  dexamethasone (DECADRON) injection 10 mg (10 mg Intramuscular Given 09/23/18 0940)  ketorolac (TORADOL) 30 MG/ML injection 30 mg (30 mg Intramuscular Given 09/23/18 0940)     Initial Impression / Assessment and Plan / ED Course  I have reviewed the triage vital signs and the nursing notes.  Pertinent labs & imaging results that were available during my care of the patient were reviewed by me and considered in my medical decision making (see chart for details).        Pt is feeling a little better.  I suspect she has some radiculopathy.  She is able to move everything well.  She will be d/c home with prednisone, lortab, and ibuprofen.  Return if worse.  F/u with pcp.  Final Clinical Impressions(s) / ED Diagnoses   Final diagnoses:  Cervical radiculopathy    ED Discharge Orders         Ordered    HYDROcodone-acetaminophen (NORCO/VICODIN) 5-325 MG tablet  Every 4 hours PRN     09/23/18 1129    ibuprofen (ADVIL) 600 MG tablet  Every 6 hours PRN     09/23/18 1129    predniSONE (STERAPRED UNI-PAK 21 TAB) 10 MG (21) TBPK tablet     09/23/18 1129           Jacalyn LefevreHaviland, Loreena Valeri, MD 09/23/18 1132

## 2018-09-23 NOTE — ED Triage Notes (Signed)
Patient reports pain to R side, from R shoulder going down R arm and hand, as well as R leg. She describes pain as aching, intermittent, and worse with movement. Denies injury, neck pain, numbness, tingling, or extremity weakness. Moves all extremities well.

## 2019-03-23 ENCOUNTER — Other Ambulatory Visit (HOSPITAL_COMMUNITY)
Admission: RE | Admit: 2019-03-23 | Discharge: 2019-03-23 | Disposition: A | Discharge status: Home / Self Care | Payer: Self-pay | Source: Ambulatory Visit | Attending: Internal Medicine | Admitting: Internal Medicine

## 2019-03-23 ENCOUNTER — Other Ambulatory Visit: Payer: Self-pay | Admitting: Internal Medicine

## 2019-03-23 DIAGNOSIS — Z124 Encounter for screening for malignant neoplasm of cervix: Secondary | ICD-10-CM | POA: Insufficient documentation

## 2019-03-29 LAB — CYTOLOGY - PAP
Comment: NEGATIVE
Diagnosis: NEGATIVE
High risk HPV: NEGATIVE

## 2019-10-13 ENCOUNTER — Other Ambulatory Visit: Payer: Self-pay

## 2019-10-13 ENCOUNTER — Emergency Department (HOSPITAL_COMMUNITY): Payer: Self-pay

## 2019-10-13 ENCOUNTER — Emergency Department (HOSPITAL_COMMUNITY)
Admission: EM | Admit: 2019-10-13 | Discharge: 2019-10-13 | Disposition: A | Discharge status: Home / Self Care | Payer: Self-pay | Attending: Emergency Medicine | Admitting: Emergency Medicine

## 2019-10-13 ENCOUNTER — Encounter (HOSPITAL_COMMUNITY): Payer: Self-pay | Admitting: Emergency Medicine

## 2019-10-13 DIAGNOSIS — I10 Essential (primary) hypertension: Secondary | ICD-10-CM | POA: Insufficient documentation

## 2019-10-13 DIAGNOSIS — M7918 Myalgia, other site: Secondary | ICD-10-CM | POA: Insufficient documentation

## 2019-10-13 DIAGNOSIS — Z20822 Contact with and (suspected) exposure to covid-19: Secondary | ICD-10-CM | POA: Insufficient documentation

## 2019-10-13 DIAGNOSIS — R1032 Left lower quadrant pain: Secondary | ICD-10-CM | POA: Insufficient documentation

## 2019-10-13 DIAGNOSIS — Z79899 Other long term (current) drug therapy: Secondary | ICD-10-CM | POA: Insufficient documentation

## 2019-10-13 DIAGNOSIS — R590 Localized enlarged lymph nodes: Secondary | ICD-10-CM | POA: Insufficient documentation

## 2019-10-13 DIAGNOSIS — J020 Streptococcal pharyngitis: Secondary | ICD-10-CM | POA: Insufficient documentation

## 2019-10-13 LAB — SARS CORONAVIRUS 2 BY RT PCR (HOSPITAL ORDER, PERFORMED IN ~~LOC~~ HOSPITAL LAB): SARS Coronavirus 2: NEGATIVE

## 2019-10-13 LAB — CBC WITH DIFFERENTIAL/PLATELET
Abs Immature Granulocytes: 0.02 10*3/uL (ref 0.00–0.07)
Basophils Absolute: 0.1 10*3/uL (ref 0.0–0.1)
Basophils Relative: 1 %
Eosinophils Absolute: 0.4 10*3/uL (ref 0.0–0.5)
Eosinophils Relative: 4 %
HCT: 32.4 % — ABNORMAL LOW (ref 36.0–46.0)
Hemoglobin: 9.1 g/dL — ABNORMAL LOW (ref 12.0–15.0)
Immature Granulocytes: 0 %
Lymphocytes Relative: 19 %
Lymphs Abs: 2 10*3/uL (ref 0.7–4.0)
MCH: 19 pg — ABNORMAL LOW (ref 26.0–34.0)
MCHC: 28.1 g/dL — ABNORMAL LOW (ref 30.0–36.0)
MCV: 67.5 fL — ABNORMAL LOW (ref 80.0–100.0)
Monocytes Absolute: 0.7 10*3/uL (ref 0.1–1.0)
Monocytes Relative: 6 %
Neutro Abs: 7.2 10*3/uL (ref 1.7–7.7)
Neutrophils Relative %: 70 %
Platelets: 235 10*3/uL (ref 150–400)
RBC: 4.8 MIL/uL (ref 3.87–5.11)
RDW: 18.8 % — ABNORMAL HIGH (ref 11.5–15.5)
Smear Review: NORMAL
WBC: 10.3 10*3/uL (ref 4.0–10.5)
nRBC: 0 % (ref 0.0–0.2)

## 2019-10-13 LAB — URINALYSIS, ROUTINE W REFLEX MICROSCOPIC
Bilirubin Urine: NEGATIVE
Glucose, UA: NEGATIVE mg/dL
Hgb urine dipstick: NEGATIVE
Ketones, ur: 5 mg/dL — AB
Nitrite: NEGATIVE
Protein, ur: 30 mg/dL — AB
Specific Gravity, Urine: 1.025 (ref 1.005–1.030)
pH: 6 (ref 5.0–8.0)

## 2019-10-13 LAB — COMPREHENSIVE METABOLIC PANEL
ALT: 29 U/L (ref 0–44)
AST: 27 U/L (ref 15–41)
Albumin: 3.6 g/dL (ref 3.5–5.0)
Alkaline Phosphatase: 82 U/L (ref 38–126)
Anion gap: 10 (ref 5–15)
BUN: 6 mg/dL (ref 6–20)
CO2: 26 mmol/L (ref 22–32)
Calcium: 8.7 mg/dL — ABNORMAL LOW (ref 8.9–10.3)
Chloride: 102 mmol/L (ref 98–111)
Creatinine, Ser: 0.52 mg/dL (ref 0.44–1.00)
GFR calc Af Amer: >60 mL/min (ref >60)
GFR calc non Af Amer: >60 mL/min (ref >60)
Glucose, Bld: 93 mg/dL (ref 70–99)
Potassium: 3.3 mmol/L — ABNORMAL LOW (ref 3.5–5.1)
Sodium: 138 mmol/L (ref 135–145)
Total Bilirubin: 0.5 mg/dL (ref 0.3–1.2)
Total Protein: 6.9 g/dL (ref 6.5–8.1)

## 2019-10-13 LAB — GROUP A STREP BY PCR: Group A Strep by PCR: DETECTED — AB

## 2019-10-13 LAB — POC URINE PREG, ED: Preg Test, Ur: NEGATIVE

## 2019-10-13 LAB — LIPASE, BLOOD: Lipase: 20 U/L (ref 11–51)

## 2019-10-13 MED ORDER — PENICILLIN V POTASSIUM 500 MG PO TABS: 500.0000 mg | ORAL_TABLET | Freq: Two times a day (BID) | ORAL | 0 refills | Status: AC

## 2019-10-13 NOTE — Discharge Instructions (Addendum)
Tylenol 1,000 mg (two extra strength pills) every 8 hours  Ibuprofen 600mg  (3 pills) every 6 hours.  Your corona virus test was negative.

## 2019-10-13 NOTE — ED Provider Notes (Signed)
[facility] EMERGENCY DEPARTMENT Provider Note   CSN: [id number] Arrival date & time: 10/13/19  4010     History Chief Complaint  Patient presents with  . Cough  . Flank Pain    Gabrielle Park is a 39 y.o. female who presents today for evaluation of multiple complaints. History is obtained through professional Spanish speaking medical interpreter. Patient reports cough, sore throat, myalgias, and arthralgias that started 4 days ago. She states she had a fever last night however this is subjective and she did not take her temperature. She reports she has been vaccinated against Covid. She denies any known sick contacts. She denies any dysuria, increased frequency or urgency. She is currently menstruating. She reports that occasionally when she does not take her antihypertensive she feels short of breath however that is unchanged from baseline. No hormone use.  She reports body aches, worse in her hands.  She denies any tick bites.    HPI     Past Medical History:  Diagnosis Date  . Hypertension   . Vaginal Pap smear, abnormal     Patient Active Problem List   Diagnosis Date Noted  . Preeclampsia, severe 03/05/2016  . Chronic hypertension complicating or reason for care during pregnancy, third trimester 03/05/2016  . SGA (small for gestational age) 02/16/2016  . Language barrier 02/01/2016  . Supervision of high risk pregnancy, antepartum 09/21/2015  . Chronic hypertension complicating pregnancy, antepartum 09/21/2015    Past Surgical History:  Procedure Laterality Date  . NO PAST SURGERIES       OB History    Gravida  4   Para  4   Term  4   Preterm  0   AB  0   Living  4     SAB  0   TAB  0   Ectopic  0   Multiple  0   Live Births  4           Family History  Problem Relation Age of Onset  . Diabetes Maternal Aunt   . Hypertension Mother     Social History   Tobacco Use  . Smoking status: Never Smoker  .  Smokeless tobacco: Never Used  Substance Use Topics  . Alcohol use: Yes    Comment: socially  . Drug use: No    Home Medications Prior to Admission medications   Medication Sig Start Date End Date Taking? Authorizing Provider  acetaminophen (TYLENOL) 500 MG tablet Take 1,000 mg by mouth every 6 (six) hours as needed for mild pain or moderate pain.    [provider]  hydrochlorothiazide (HYDRODIURIL) 12.5 MG tablet Take 1 tablet (12.5 mg total) by mouth daily. Patient taking differently: Take 25 mg by mouth daily.  03/07/16   Anyanwu, Jethro Bastos, MD  HYDROcodone-acetaminophen (NORCO/VICODIN) 5-325 MG tablet Take 1 tablet by mouth every 4 (four) hours as needed. 09/23/18   Jacalyn Lefevre, MD  ibuprofen (ADVIL) 600 MG tablet Take 1 tablet (600 mg total) by mouth every 6 (six) hours as needed. 09/23/18   Jacalyn Lefevre, MD  penicillin v potassium (VEETID) 500 MG tablet Take 1 tablet (500 mg total) by mouth 2 (two) times daily for 10 days. 10/13/19 10/23/19  Cristina Gong, PA-C  predniSONE (STERAPRED UNI-PAK 21 TAB) 10 MG (21) TBPK tablet Take 6 tabs for 2 days, then 5 for 2 days, then 4 for 2 days, then 3 for 2 days, 2 for 2 days, then 1 for 2  days 09/23/18   Jacalyn Lefevre, MD  Prenatal Vit-Fe Phos-FA-Omega (VITAFOL GUMMIES) 3.33-0.333-34.8 MG CHEW Chew 1 tablet by mouth daily. Patient not taking: Reported on 09/23/2018 01/25/16   Hermina Staggers, MD    Allergies    Patient has no known allergies.  Review of Systems   Review of Systems  Constitutional: Positive for chills and fever.  HENT: Positive for sore throat.   Eyes: Negative for visual disturbance.  Respiratory: Positive for cough and shortness of breath.   Cardiovascular: Negative for chest pain, palpitations and leg swelling.  Genitourinary: Negative for dysuria, flank pain, frequency and menstrual problem (Currently menstruating. ).  Musculoskeletal: Positive for arthralgias and myalgias.  Neurological: Negative  for weakness and headaches.    Physical Exam Updated Vital Signs BP 135/70   Pulse 68   Temp 98.4 F (36.9 C) (Oral)   Resp 13   SpO2 100%   Physical Exam Vitals and nursing note reviewed.  Constitutional:      General: She is not in acute distress.    Appearance: She is well-developed. She is not diaphoretic.  HENT:     Head: Normocephalic and atraumatic.     Mouth/Throat:     Mouth: Mucous membranes are moist.     Tongue: No lesions.     Pharynx: Uvula midline.     Tonsils: 2+ on the right. 2+ on the left.     Comments: Petechiae present on the palate and oropharynx. Eyes:     General: No scleral icterus.       Right eye: No discharge.        Left eye: No discharge.     Conjunctiva/sclera: Conjunctivae normal.  Cardiovascular:     Rate and Rhythm: Normal rate and regular rhythm.  Pulmonary:     Effort: Pulmonary effort is normal. No respiratory distress.     Breath sounds: No stridor.  Abdominal:     General: There is no distension.     Palpations: Abdomen is soft.     Tenderness: There is no abdominal tenderness. There is no right CVA tenderness or left CVA tenderness.  Musculoskeletal:        General: No deformity.     Cervical back: Full passive range of motion without pain, normal range of motion and neck supple.  Lymphadenopathy:     Cervical: Cervical adenopathy present.  Skin:    General: Skin is warm and dry.  Neurological:     General: No focal deficit present.     Mental Status: She is alert.     Cranial Nerves: No cranial nerve deficit.     Motor: No abnormal muscle tone.  Psychiatric:        Behavior: Behavior normal.     ED Results / Procedures / Treatments   Labs (all labs ordered are listed, but only abnormal results are displayed) Labs Reviewed  GROUP A STREP BY PCR - Abnormal; Notable for the following components:      Result Value   Group A Strep by PCR DETECTED (*)    All other components within normal limits  URINALYSIS, ROUTINE W  REFLEX MICROSCOPIC - Abnormal; Notable for the following components:   Color, Urine AMBER (*)    APPearance HAZY (*)    Ketones, ur 5 (*)    Protein, ur 30 (*)    Leukocytes,Ua SMALL (*)    Bacteria, UA RARE (*)    All other components within normal limits  COMPREHENSIVE METABOLIC PANEL - Abnormal;  Notable for the following components:   Potassium 3.3 (*)    Calcium 8.7 (*)    All other components within normal limits  CBC WITH DIFFERENTIAL/PLATELET - Abnormal; Notable for the following components:   Hemoglobin 9.1 (*)    HCT 32.4 (*)    MCV 67.5 (*)    MCH 19.0 (*)    MCHC 28.1 (*)    RDW 18.8 (*)    All other components within normal limits  SARS CORONAVIRUS 2 BY RT PCR (HOSPITAL ORDER, PERFORMED IN [facility])  LIPASE, BLOOD  POC URINE PREG, ED    EKG None  Radiology DG Chest Port 1 View  Result Date: 10/13/2019 CLINICAL DATA:  Cough, sore throat and left flank pain since 10/09/2019. EXAM: PORTABLE CHEST 1 VIEW COMPARISON:  PA and lateral chest 02/15/2014. FINDINGS: Lungs are clear. Heart size is normal. No pneumothorax or pleural fluid. No acute or focal bony abnormality IMPRESSION: Normal chest. Electronically Signed   By: Drusilla Kanner M.D.   On: 10/13/2019 13:47    Procedures Procedures (including critical care time)  Medications Ordered in ED Medications - No data to display  ED Course  I have reviewed the triage vital signs and the nursing notes.  Pertinent labs & imaging results that were available during my care of the patient were reviewed by me and considered in my medical decision making (see chart for details).    MDM Rules/Calculators/A&P                         Patient is a 39 year old woman who presents today for evaluation of fever, chills, body aches, cough, and sore throat.  On my exam tonsils are without exudate however she does have palatal petechiae.  She has diffuse body wide pain, worse in her hands and feet.  Labs are obtained  and reviewed, she is anemic with a hemoglobin of 5.9 consistent with her baseline.  CMP shows a mild hypokalemia again consistent with her baseline.  Lipase is not elevated.  Pregnancy test is negative.  Chest x-ray was obtained based on cough without evidence of consolidation, pneumothorax, or other abnormalities.  Her Covid test is negative and she is fully vaccinated against Covid per her report.  Strep test was positive.  While it would be atypical for strep infection to cause cough, she does have reported fever, lymphadenopathy.  We will treat with p.o. penicillin.  Her urine did have hematuria, however she is currently menstruating, she does not have any urinary symptoms therefore do not suspect UTI.  Return precautions were discussed with patient who states their understanding.  At the time of discharge patient denied any unaddressed complaints or concerns.  Patient is agreeable for discharge home.  Note: Portions of this report may have been transcribed using voice recognition software. Every effort was made to ensure accuracy; however, inadvertent computerized transcription errors may be present  Final Clinical Impression(s) / ED Diagnoses Final diagnoses:  Strep throat    Rx / DC Orders ED Discharge Orders         Ordered    penicillin v potassium (VEETID) 500 MG tablet  2 times daily     Discontinue  Reprint     10/13/19 1522           Cristina Gong, PA-C 10/13/19 1557    Alvira Monday, MD 10/14/19 2237

## 2019-10-13 NOTE — ED Triage Notes (Addendum)
Pt presents to ED POV. Pt c/o cough, sore throat, and L flank pain that began Saturday. Pt reports having a fever last night but did not take temperature. NAD

## 2019-10-21 ENCOUNTER — Other Ambulatory Visit (HOSPITAL_COMMUNITY)
Admission: RE | Admit: 2019-10-21 | Discharge: 2019-10-21 | Disposition: A | Discharge status: Home / Self Care | Payer: Self-pay | Source: Ambulatory Visit | Attending: Physician Assistant | Admitting: Physician Assistant

## 2019-10-21 DIAGNOSIS — Z124 Encounter for screening for malignant neoplasm of cervix: Secondary | ICD-10-CM | POA: Insufficient documentation

## 2019-10-27 LAB — CYTOLOGY - PAP: Diagnosis: NEGATIVE

## 2020-06-19 IMAGING — DX CERVICAL SPINE - COMPLETE 4+ VIEW
5 series · 5 of 5 positions shown · non-contrast
Comparison: None.

CLINICAL DATA: Neck pain, right shoulder and arm pain.

EXAM:
CERVICAL SPINE - COMPLETE 4+ VIEW

[c-spine lat]
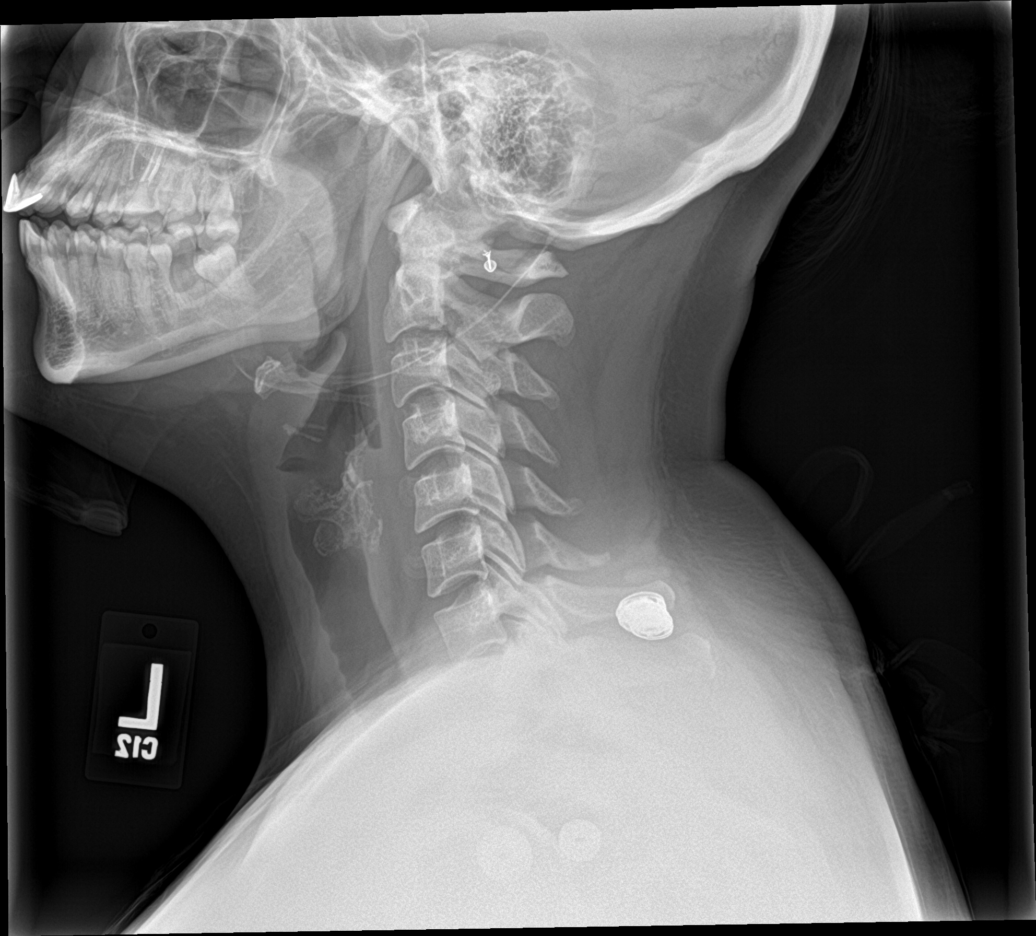

[c-spine obl (1 of 2)]
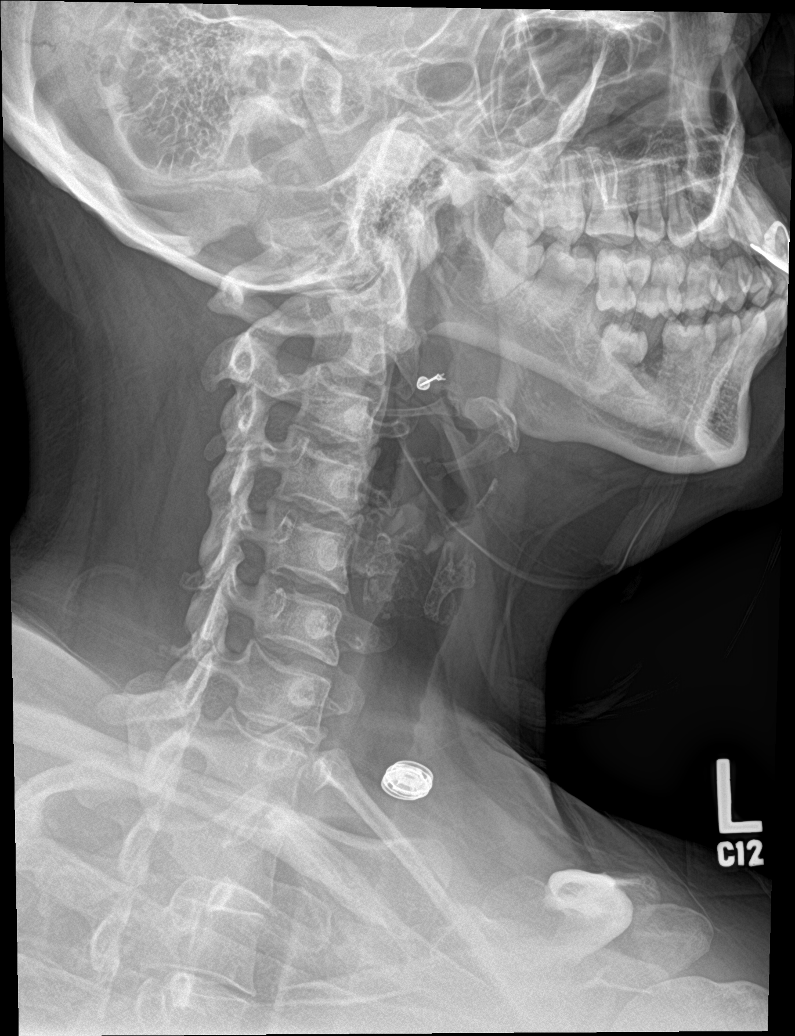

[c-spine obl (2 of 2)]
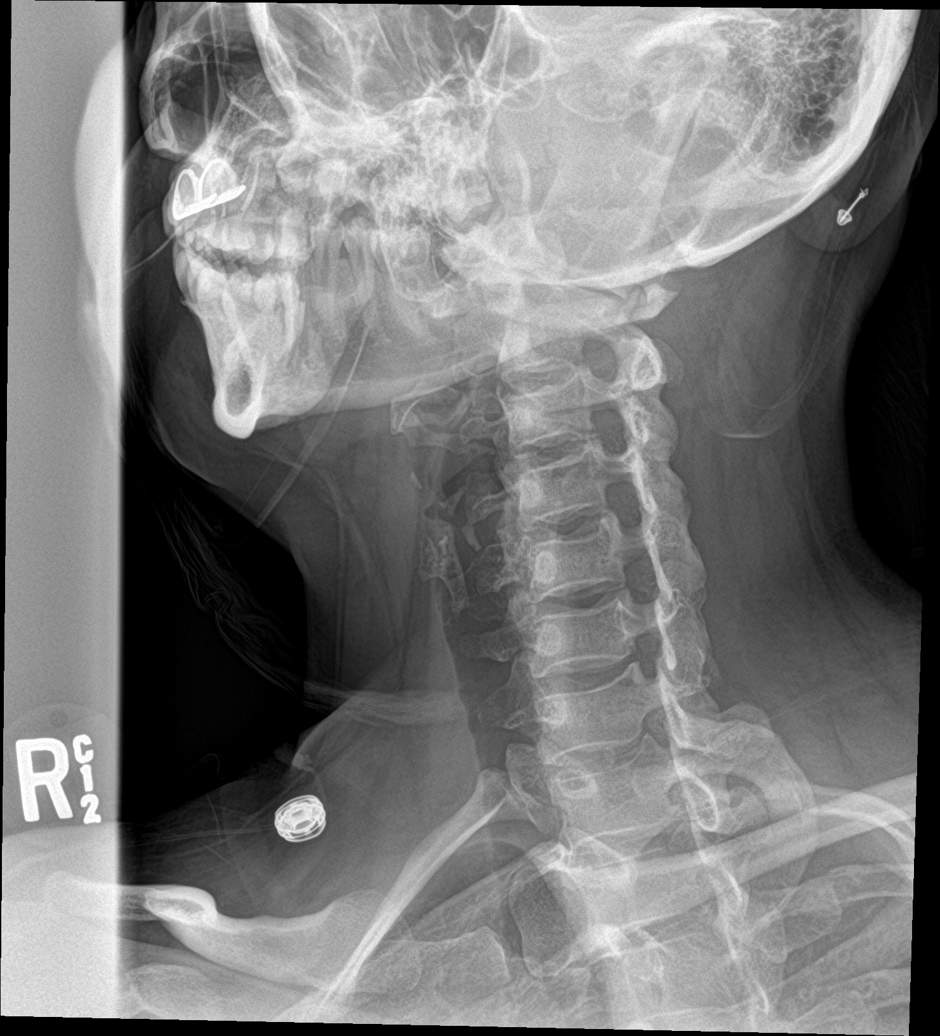

[c-spine ap]
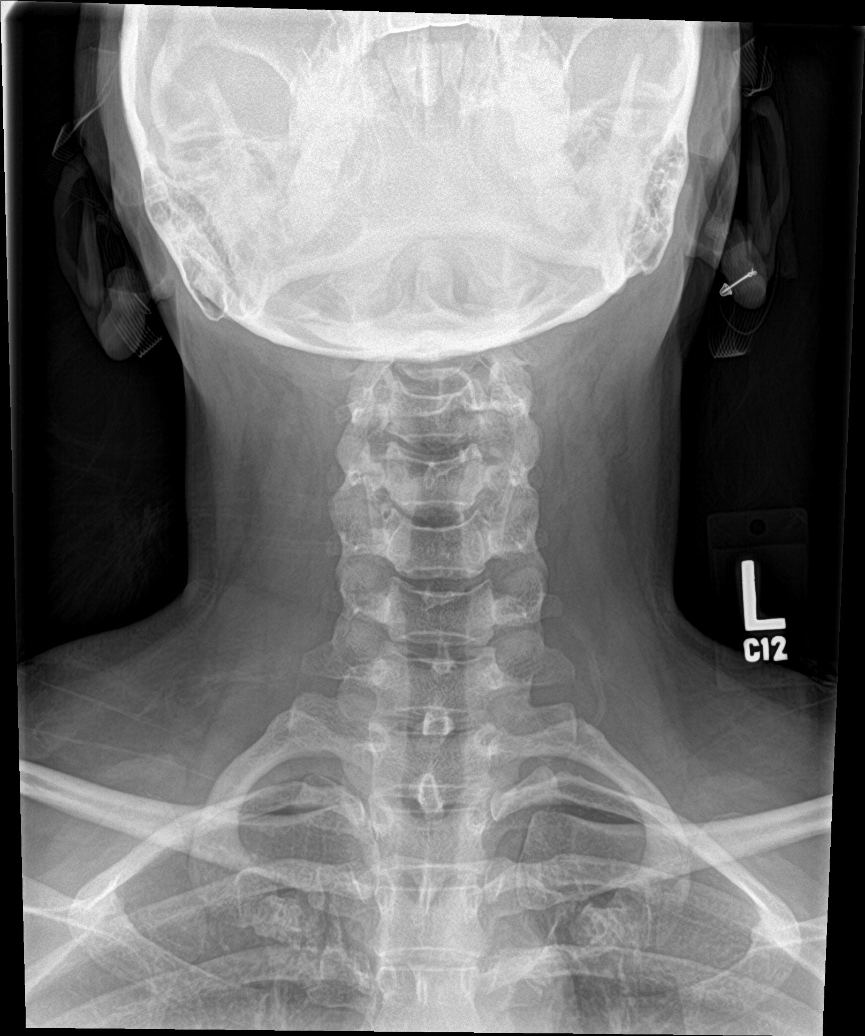

[c-spine open mouth]
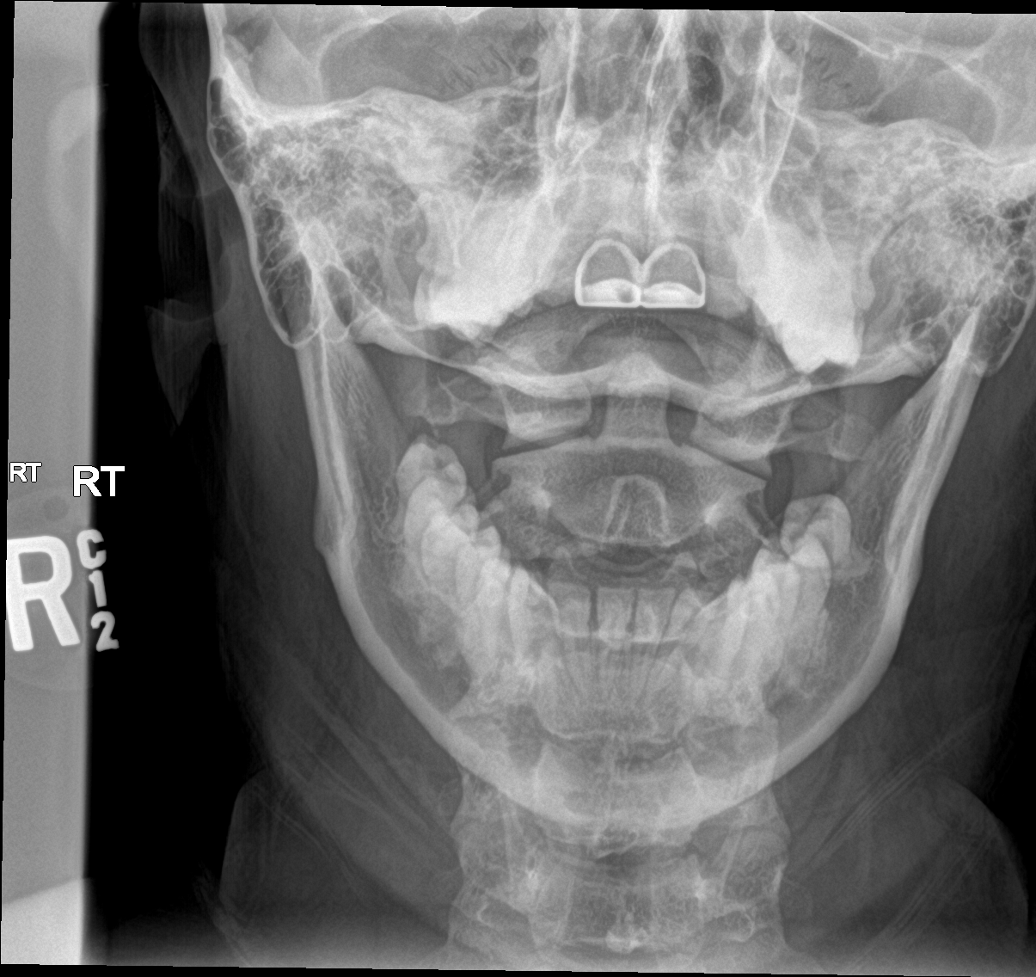

[5 of 5 positions shown; findings below may reference images not displayed]

FINDINGS: The cervical vertebral bodies are normally aligned. Disc spaces and
vertebral bodies are maintained. No significant degenerative
changes. No acute bony findings or abnormal prevertebral soft tissue
swelling. The facets are normally aligned. The neural foramen are
patent. The C1-2 articulations are maintained. The lung apices are
clear.
IMPRESSION: Normal alignment and no acute bony findings or degenerative changes.

## 2021-07-09 IMAGING — DX DG CHEST 1V PORT
1 series · 1 of 1 positions shown · non-contrast
Comparison: PA and lateral chest 02/15/2014.

CLINICAL DATA: Cough, sore throat and left flank pain since
10/09/2019.

EXAM:
PORTABLE CHEST 1 VIEW

[chest]
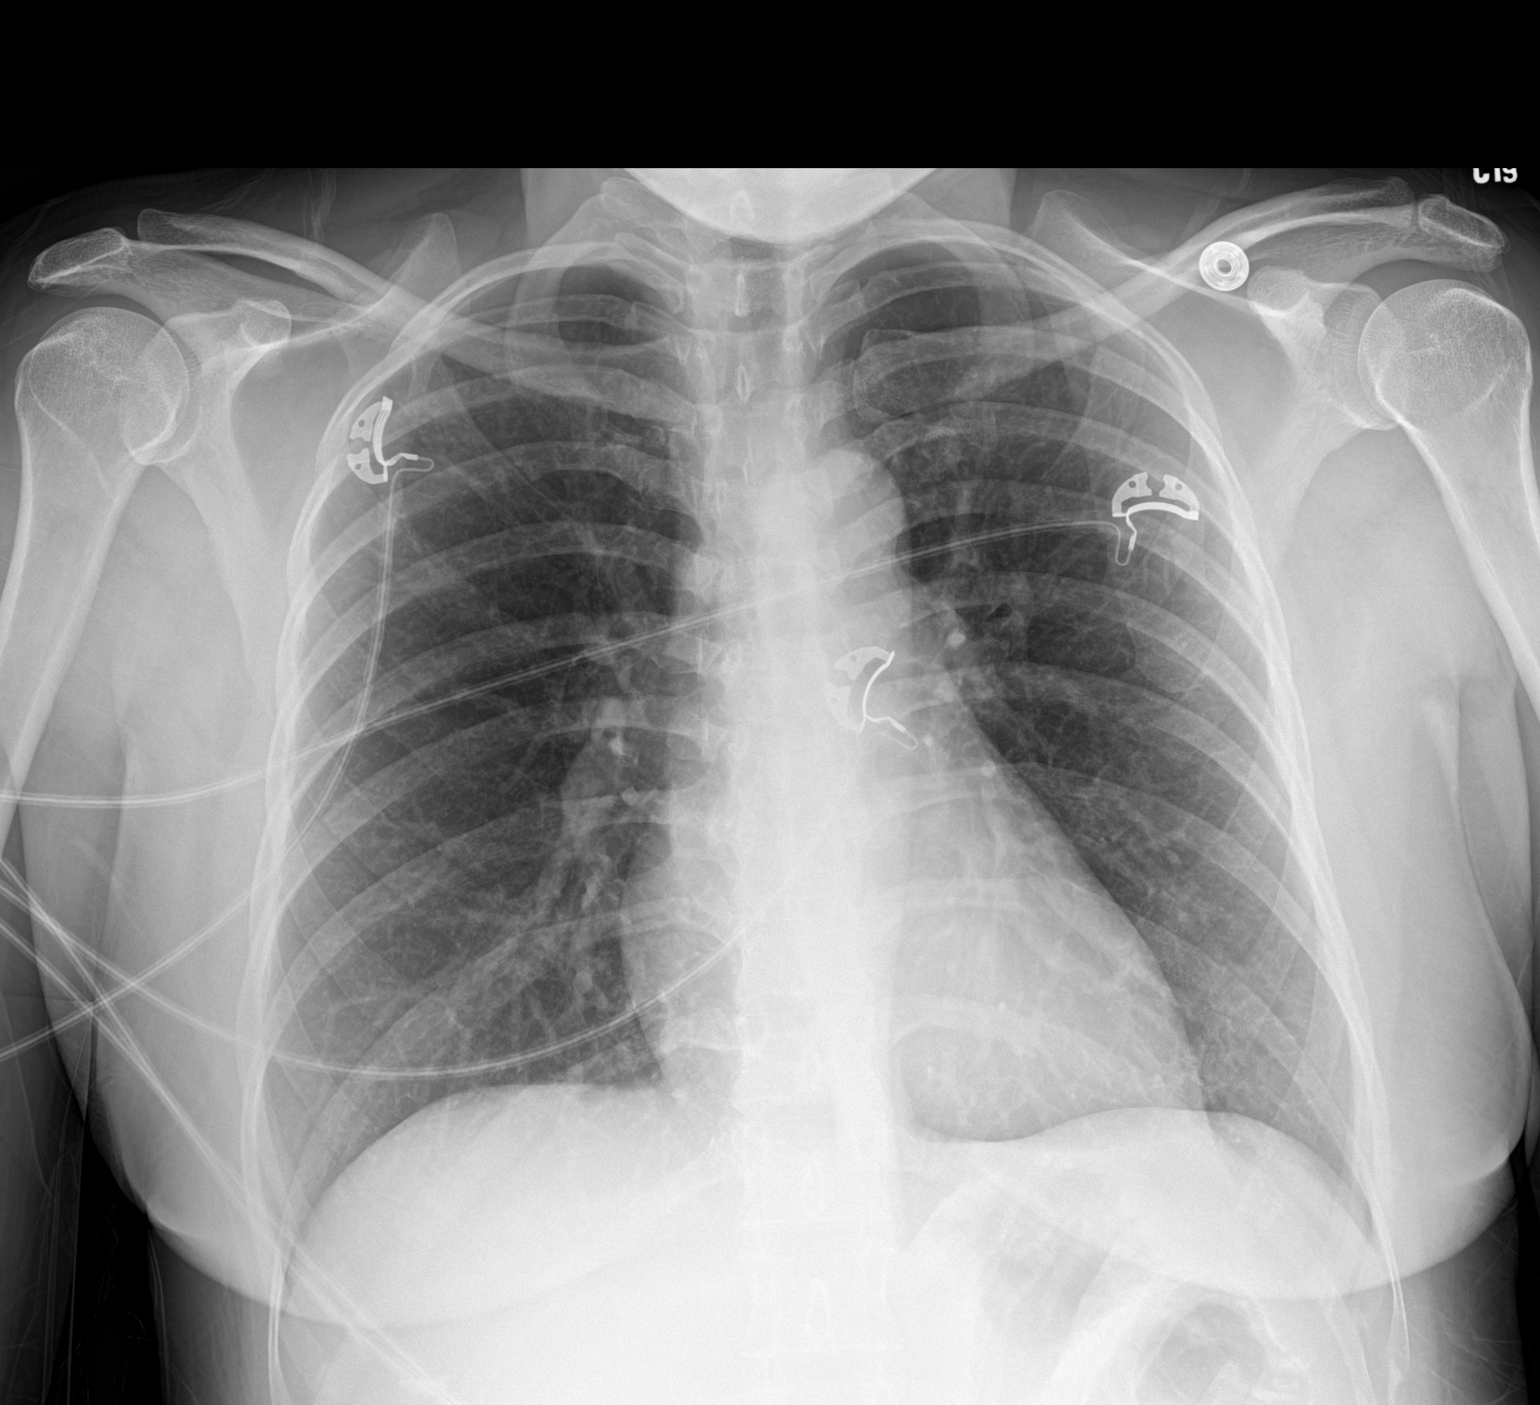

[1 of 1 positions shown; findings below may reference images not displayed]

FINDINGS: Lungs are clear. Heart size is normal. No pneumothorax or pleural
fluid. No acute or focal bony abnormality
IMPRESSION: Normal chest.

## 2023-01-09 ENCOUNTER — Ambulatory Visit (HOSPITAL_COMMUNITY)
Admission: EM | Admit: 2023-01-09 | Discharge: 2023-01-09 | Disposition: A | Discharge status: Home / Self Care | Payer: Self-pay | Attending: Family Medicine | Admitting: Family Medicine

## 2023-01-09 ENCOUNTER — Encounter (HOSPITAL_COMMUNITY): Payer: Self-pay | Admitting: Emergency Medicine

## 2023-01-09 DIAGNOSIS — N3090 Cystitis, unspecified without hematuria: Secondary | ICD-10-CM | POA: Insufficient documentation

## 2023-01-09 LAB — POCT URINALYSIS DIP (MANUAL ENTRY)
Bilirubin, UA: NEGATIVE
Glucose, UA: 100 mg/dL — AB
Ketones, POC UA: NEGATIVE mg/dL
Nitrite, UA: POSITIVE — AB
Protein Ur, POC: 30 mg/dL — AB
Spec Grav, UA: 1.015 (ref 1.010–1.025)
Urobilinogen, UA: 1 U/dL
pH, UA: 7 (ref 5.0–8.0)

## 2023-01-09 MED ORDER — PHENAZOPYRIDINE HCL 100 MG PO TABS: 100.0000 mg | ORAL_TABLET | Freq: Three times a day (TID) | ORAL | 0 refills | Status: DC | PRN

## 2023-01-09 MED ORDER — NITROFURANTOIN MONOHYD MACRO 100 MG PO CAPS: 100.0000 mg | ORAL_CAPSULE | Freq: Two times a day (BID) | ORAL | 0 refills | Status: DC

## 2023-01-09 NOTE — ED Provider Notes (Addendum)
[facility]    CSN: [id number] Arrival date & time: 01/09/23  1932      History   Chief Complaint No chief complaint on file.   HPI Gabrielle Park is a 42 y.o. female.   HPI Here for dysuria and urinary frequency and pelvic pressure.  Symptoms began about 3 days ago.  Yesterday she had some fever.  No vomiting or nausea.  At triage, staff understood her to say she had some vaginal discharge.  When I questioned her about this, she states she has not had any vaginal discharge or itching.  No allergies to medications  Last menstrual cycle was September 15. Past Medical History:  Diagnosis Date   Hypertension    Vaginal Pap smear, abnormal     Patient Active Problem List   Diagnosis Date Noted   Preeclampsia, severe 03/05/2016   Chronic hypertension complicating or reason for care during pregnancy, third trimester 03/05/2016   SGA (small for gestational age) 02/16/2016   Language barrier 02/01/2016   Supervision of high risk pregnancy, antepartum 09/21/2015   Chronic hypertension complicating pregnancy, antepartum 09/21/2015    Past Surgical History:  Procedure Laterality Date   NO PAST SURGERIES      OB History     Gravida  4   Para  4   Term  4   Preterm  0   AB  0   Living  4      SAB  0   IAB  0   Ectopic  0   Multiple  0   Live Births  4            Home Medications    Prior to Admission medications   Medication Sig Start Date End Date Taking? Authorizing Provider  nitrofurantoin, macrocrystal-monohydrate, (MACROBID) 100 MG capsule Take 1 capsule (100 mg total) by mouth 2 (two) times daily. 01/09/23  Yes Zenia Resides, MD  phenazopyridine (PYRIDIUM) 100 MG tablet Take 1 tablet (100 mg total) by mouth 3 (three) times daily as needed (urinary pain). 01/09/23  Yes Zenia Resides, MD  acetaminophen (TYLENOL) 500 MG tablet Take 1,000 mg by mouth every 6 (six) hours as needed for mild pain or moderate pain.     [provider]  hydrochlorothiazide (HYDRODIURIL) 12.5 MG tablet Take 1 tablet (12.5 mg total) by mouth daily. Patient taking differently: Take 25 mg by mouth daily.  03/07/16   Anyanwu, Jethro Bastos, MD  Prenatal Vit-Fe Phos-FA-Omega (VITAFOL GUMMIES) 3.33-0.333-34.8 MG CHEW Chew 1 tablet by mouth daily. Patient not taking: Reported on 09/23/2018 01/25/16   Hermina Staggers, MD    Family History Family History  Problem Relation Age of Onset   Diabetes Maternal Aunt    Hypertension Mother     Social History Social History   Tobacco Use   Smoking status: Never   Smokeless tobacco: Never  Vaping Use   Vaping status: Never Used  Substance Use Topics   Alcohol use: Yes    Comment: socially   Drug use: No     Allergies   Patient has no known allergies.   Review of Systems Review of Systems   Physical Exam Triage Vital Signs ED Triage Vitals  Encounter Vitals Group     BP 01/09/23 2002 (!) 158/79     Systolic BP Percentile --      Diastolic BP Percentile --      Pulse Rate 01/09/23 2002 64     Resp 01/09/23 2002  16     Temp 01/09/23 2002 98.4 F (36.9 C)     Temp src --      SpO2 01/09/23 2002 97 %     Weight --      Height --      Head Circumference --      Peak Flow --      Pain Score 01/09/23 2000 10     Pain Loc --      Pain Education --      Exclude from Growth Chart --    No data found.  Updated Vital Signs BP (!) 158/79 (BP Location: Right Arm)   Pulse 64   Temp 98.4 F (36.9 C)   Resp 16   LMP 12/22/2022   SpO2 97%   Visual Acuity Right Eye Distance:   Left Eye Distance:   Bilateral Distance:    Right Eye Near:   Left Eye Near:    Bilateral Near:     Physical Exam Vitals reviewed.  Constitutional:      General: She is not in acute distress.    Appearance: She is not ill-appearing, toxic-appearing or diaphoretic.  HENT:     Mouth/Throat:     Mouth: Mucous membranes are moist.  Cardiovascular:     Rate and Rhythm: Normal  rate and regular rhythm.  Pulmonary:     Effort: Pulmonary effort is normal.     Breath sounds: Normal breath sounds.  Abdominal:     Palpations: Abdomen is soft.     Tenderness: There is abdominal tenderness (suprapubic).  Skin:    Coloration: Skin is not jaundiced or pale.  Neurological:     General: No focal deficit present.     Mental Status: She is alert and oriented to person, place, and time.  Psychiatric:        Behavior: Behavior normal.      UC Treatments / Results  Labs (all labs ordered are listed, but only abnormal results are displayed) Labs Reviewed  POCT URINALYSIS DIP (MANUAL ENTRY) - Abnormal; Notable for the following components:      Result Value   Color, UA orange (*)    Glucose, UA =100 (*)    Blood, UA small (*)    Protein Ur, POC =30 (*)    Nitrite, UA Positive (*)    Leukocytes, UA Small (1+) (*)    All other components within normal limits  URINE CULTURE    EKG   Radiology No results found.  Procedures Procedures (including critical care time)  Medications Ordered in UC Medications - No data to display  Initial Impression / Assessment and Plan / UC Course  I have reviewed the triage vital signs and the nursing notes.  Pertinent labs & imaging results that were available during my care of the patient were reviewed by me and considered in my medical decision making (see chart for details).      Initial part of the visit with other staff was conducted with a friend interpreting for this patient.  I conducted my part of the visit in Spanish, interviewing the patient directly. UA shows nitrites, small amount of leukocytes and RBCs.  Macrodantin is sent in for cystitis and Pyridium sent in for the urinary pain.  Urine culture is sent and we will notify her if it looks like anything needs to be changed. Final Clinical Impressions(s) / UC Diagnoses   Final diagnoses:  Cystitis     Discharge Instructions  The urinalysis showed  nitrites, white blood cells and red blood cells.  This is consistent with a bladder infection.  Urine culture is sent. (El anlisis de orina mostr nitritos, glbulos blancos y glbulos rojos.  Esto es consistente con una infeccin de la vejiga.  Se enva urocultivo)    Take nitrofurantoin 100 mg--1 capsule 2 times daily for 5 days.  Tome nitrofurantona 100 mg: 1 cpsula 2 veces al da durante 5 [place].  Staff will notify you if the antibiotic should be changed according to the urine culture. (El Gaffer notificar si se debe cambiar el antibitico segn el urocultivo.)     ED Prescriptions     Medication Sig Dispense Auth. Provider   nitrofurantoin, macrocrystal-monohydrate, (MACROBID) 100 MG capsule Take 1 capsule (100 mg total) by mouth 2 (two) times daily. 10 capsule Zenia Resides, MD   phenazopyridine (PYRIDIUM) 100 MG tablet Take 1 tablet (100 mg total) by mouth 3 (three) times daily as needed (urinary pain). 10 tablet Marlinda Mike Janace Aris, MD      PDMP not reviewed this encounter.   Zenia Resides, MD 01/09/23 2029    Zenia Resides, MD 01/09/23 2030

## 2023-01-09 NOTE — ED Triage Notes (Signed)
Pt c/o vaginal pain, discharge, vaginal swelling, and burning after urinating  that started Tuesday. She had a fever yesterday

## 2023-01-09 NOTE — Discharge Instructions (Signed)
The urinalysis showed nitrites, white blood cells and red blood cells.  This is consistent with a bladder infection.  Urine culture is sent. (El anlisis de orina mostr nitritos, glbulos blancos y glbulos rojos.  Esto es consistente con una infeccin de la vejiga.  Se enva urocultivo)    Take nitrofurantoin 100 mg--1 capsule 2 times daily for 5 days.  Tome nitrofurantona 100 mg: 1 cpsula 2 veces al da durante 5 [place].  Staff will notify you if the antibiotic should be changed according to the urine culture. (El Gaffer notificar si se debe cambiar el antibitico segn el urocultivo.)

## 2023-01-11 LAB — URINE CULTURE: Culture: 60000 — AB

## 2023-06-04 ENCOUNTER — Telehealth: Payer: Self-pay

## 2023-06-04 ENCOUNTER — Encounter (HOSPITAL_COMMUNITY): Payer: Self-pay | Admitting: Obstetrics & Gynecology

## 2023-06-04 ENCOUNTER — Other Ambulatory Visit: Payer: Self-pay

## 2023-06-04 ENCOUNTER — Ambulatory Visit (INDEPENDENT_AMBULATORY_CARE_PROVIDER_SITE_OTHER): Payer: Self-pay | Admitting: Obstetrics and Gynecology

## 2023-06-04 VITALS — BP 148/98 | HR 75 | Ht 60.0 in | Wt 125.1 lb

## 2023-06-04 DIAGNOSIS — Z603 Acculturation difficulty: Secondary | ICD-10-CM

## 2023-06-04 DIAGNOSIS — O10919 Unspecified pre-existing hypertension complicating pregnancy, unspecified trimester: Secondary | ICD-10-CM

## 2023-06-04 DIAGNOSIS — Z758 Other problems related to medical facilities and other health care: Secondary | ICD-10-CM

## 2023-06-04 DIAGNOSIS — O021 Missed abortion: Secondary | ICD-10-CM

## 2023-06-04 DIAGNOSIS — Z3A09 9 weeks gestation of pregnancy: Secondary | ICD-10-CM

## 2023-06-04 DIAGNOSIS — O09521 Supervision of elderly multigravida, first trimester: Secondary | ICD-10-CM

## 2023-06-04 MED ORDER — DOXYCYCLINE HYCLATE 100 MG IV SOLR
200.0000 mg | INTRAVENOUS | Status: AC
  Administered 2023-06-05: 200 mg via INTRAVENOUS
  Filled 2023-06-04 (×2): qty 200

## 2023-06-04 NOTE — Progress Notes (Signed)
 Spoke w/ via phone for pre-op interview--- Gabrielle Park-Interpreter needed, requested for tomorrow. Lab needs dos---- EKG and BMP  per anesthesia      Lab results------ COVID test -----patient states asymptomatic no test needed Arrive at -------1030 NPO after MN NO Solid Food.  Clear liquids from MN until---0930 Pre-Surgery Ensure or G2:  Med rec completed Medications to take morning of surgery -----NONE Diabetic medication -----  GLP1 agonist last dose: GLP1 instructions:  Patient instructed no nail polish to be worn day of surgery Patient instructed to bring photo id and insurance card day of surgery Patient aware to have Driver (ride ) / caregiver    for 24 hours after surgery - Gabrielle Park Patient Special Instructions ----- Pre-Op special Instructions -----  Patient verbalized understanding of instructions that were given at this phone interview. Patient denies chest pain, sob, fever, cough at the interview.

## 2023-06-04 NOTE — Progress Notes (Signed)
 Obstetrics and Gynecology New Patient Evaluation  Appointment Date: 06/04/2023  OB[facility]N Clinic: [facility]  Primary Care Provider: [facility]  Referring Provider: [facility]  Chief Complaint: missed AB follow up   History of Present Illness: Gabrielle Park is a 43 y.o. Hispanic [id number] (LMP: 12/10), seen for the above chief complaint. Her past medical history is significant for AMA, CHTN.  Patient had viability scan at [facility] two days ago and diagnosed 9/5wks missed AB, based on CRL of 29mm; dopplers used. Patient states LMP 12/10 which would make her 11/1 weeks today and she states her periods were qmonth and regular. She denies any VB, fevers, chills, pain, vaginal discharge.  Two days ago was her first u/s this pregnancy  Review of Systems: Pertinent items are noted in HPI.    Patient Active Problem List   Diagnosis Date Noted   Missed ab 06/04/2023   History of severe pre-eclampsia 03/05/2016   Chronic hypertension complicating or reason for care during pregnancy, third trimester 03/05/2016   Language barrier 02/01/2016   Supervision of high risk pregnancy, antepartum 09/21/2015   Chronic hypertension complicating pregnancy, antepartum 09/21/2015    Past Medical History:  Past Medical History:  Diagnosis Date   Hypertension    Vaginal Pap smear, abnormal     Past Surgical History:  Past Surgical History:  Procedure Laterality Date   NO PAST SURGERIES      Past Obstetrical History:  OB History  Gravida Para Term Preterm AB Living  4 4 4  0 0 4  SAB IAB Ectopic Multiple Live Births  0 0 0 0 4    # Outcome Date GA Lbr Len/2nd Weight Sex Type Anes PTL Lv  4 Term 03/05/16 [redacted]w[redacted]d 00:57 / 00:01 5 lb 6.2 oz (2.444 kg) M Vag-Spont None  LIV     Birth Comments: wnl  3 Term 06/24/09 [redacted]w[redacted]d  5 lb 8 oz (2.495 kg) M Vag-Spont EPI N LIV  2 Term 10/01/04 [redacted]w[redacted]d  6 lb 6 oz (2.892 kg) F Vag-Spont EPI N LIV  1 Term 10/09/01 [redacted]w[redacted]d  7 lb 2 oz (3.232 kg)  F Vag-Spont EPI N LIV    Past Gynecological History: As per HPI.  Social History:  Social History   Socioeconomic History   Marital status: Single    Spouse name: Not on file   Number of children: Not on file   Years of education: Not on file   Highest education level: Not on file  Occupational History   Not on file  Tobacco Use   Smoking status: Never   Smokeless tobacco: Never  Vaping Use   Vaping status: Never Used  Substance and Sexual Activity   Alcohol use: Yes    Comment: socially   Drug use: No   Sexual activity: Yes    Birth control/protection: None    Comment: PARAGARD  Other Topics Concern   Not on file  Social History Narrative   Not on file   Social Drivers of Health   Financial Resource Strain: Not on file  Food Insecurity: Not on file  Transportation Needs: Not on file  Physical Activity: Not on file  Stress: Not on file  Social Connections: Unknown (08/21/2021)   Received from [facility], [facility]   Social Network    Social Network: Not on file  Intimate Partner Violence: Unknown (07/13/2021)   Received from [facility], [facility]   HITS    Physically Hurt: Not on file  Insult or Talk Down To: Not on file    Threaten Physical Harm: Not on file    Scream or Curse: Not on file    Family History:  Family History  Problem Relation Age of Onset   Diabetes Maternal Aunt    Hypertension Mother     Medications Gabrielle Park had no medications administered during this visit. Current Outpatient Medications  Medication Sig Dispense Refill   hydrochlorothiazide (HYDRODIURIL) 12.5 MG tablet Take 1 tablet (12.5 mg total) by mouth daily. (Patient taking differently: Take 25 mg by mouth daily.) 90 tablet 4   Prenatal Vit-Fe Phos-FA-Omega (VITAFOL GUMMIES) 3.33-0.333-34.8 MG CHEW Chew 1 tablet by mouth daily. 90 tablet 3   acetaminophen (TYLENOL) 500 MG tablet Take 1,000 mg by mouth every 6 (six) hours as needed for mild pain or  moderate pain. (Patient not taking: Reported on 06/04/2023)     nitrofurantoin, macrocrystal-monohydrate, (MACROBID) 100 MG capsule Take 1 capsule (100 mg total) by mouth 2 (two) times daily. (Patient not taking: Reported on 06/04/2023) 10 capsule 0   phenazopyridine (PYRIDIUM) 100 MG tablet Take 1 tablet (100 mg total) by mouth 3 (three) times daily as needed (urinary pain). (Patient not taking: Reported on 06/04/2023) 10 tablet 0   No current facility-administered medications for this visit.    Allergies Patient has no known allergies.   Physical Exam:  BP (!) 148/98   Pulse 75   Ht 5' (1.524 m)   Wt 125 lb 1.6 oz (56.7 kg)   BMI 24.43 kg/m  Body mass index is 24.43 kg/m. General appearance: Well nourished, well developed female in no acute distress.  Neck:  Supple, normal appearance, and no thyromegaly  Respiratory:   Normal respiratory effort Abdomen: positive bowel sounds and no masses, hernias; diffusely non tender to palpation, non distended Neuro/Psych:  Normal mood and affect.  Skin:  Warm and dry.   Laboratory: O POS  Radiology: bedside u/s with IUP that looks like around 9wks and no cardiac motion noted; doppler mode used. No signs of bleeding or SAB in progress  Assessment: patient stable  Plan:  1. Missed ab (Primary) Long d/w them re: diagnosis and options. I told them most likely due to [facility] with 40 or over increasing SAB and genetic issues risk but plenty of women have a normal pregnancy at age 33, if they want to try again.  After d/w them re: options, they elect for d&c in the OR. Request sent to [facility]. ED precuations given  2. Chronic hypertension complicating pregnancy, antepartum I told them the hydrochlorothiazide was fine  3. Multigravida of advanced maternal age in first trimester  4. Language barrier Ipad interpreter used   RTC post op  Cornelia Copa MD Attending Center for [employer] [address] [facility])

## 2023-06-04 NOTE — H&P (View-Only) (Signed)
 Obstetrics and Gynecology New Patient Evaluation  Appointment Date: 06/04/2023  OBGYN Clinic: [facility]  Primary Care Provider: [facility]  Referring Provider: [facility]  Chief Complaint: missed AB follow up   History of Present Illness: Gabrielle Park is a 43 y.o. Hispanic [id number] (LMP: 12/10), seen for the above chief complaint. Her past medical history is significant for AMA, CHTN.  Patient had viability scan at [facility] two days ago and diagnosed 9/5wks missed AB, based on CRL of 29mm; dopplers used. Patient states LMP 12/10 which would make her 11/1 weeks today and she states her periods were qmonth and regular. She denies any VB, fevers, chills, pain, vaginal discharge.  Two days ago was her first u/s this pregnancy  Review of Systems: Pertinent items are noted in HPI.    Patient Active Problem List   Diagnosis Date Noted   Missed ab 06/04/2023   History of severe pre-eclampsia 03/05/2016   Chronic hypertension complicating or reason for care during pregnancy, third trimester 03/05/2016   Language barrier 02/01/2016   Supervision of high risk pregnancy, antepartum 09/21/2015   Chronic hypertension complicating pregnancy, antepartum 09/21/2015    Past Medical History:  Past Medical History:  Diagnosis Date   Hypertension    Vaginal Pap smear, abnormal     Past Surgical History:  Past Surgical History:  Procedure Laterality Date   NO PAST SURGERIES      Past Obstetrical History:  OB History  Gravida Para Term Preterm AB Living  4 4 4  0 0 4  SAB IAB Ectopic Multiple Live Births  0 0 0 0 4    # Outcome Date GA Lbr Len/2nd Weight Sex Type Anes PTL Lv  4 Term 03/05/16 [redacted]w[redacted]d 00:57 / 00:01 5 lb 6.2 oz (2.444 kg) M Vag-Spont None  LIV     Birth Comments: wnl  3 Term 06/24/09 [redacted]w[redacted]d  5 lb 8 oz (2.495 kg) M Vag-Spont EPI N LIV  2 Term 10/01/04 [redacted]w[redacted]d  6 lb 6 oz (2.892 kg) F Vag-Spont EPI N LIV  1 Term 10/09/01 [redacted]w[redacted]d  7 lb 2 oz (3.232 kg)  F Vag-Spont EPI N LIV    Past Gynecological History: As per HPI.  Social History:  Social History   Socioeconomic History   Marital status: Single    Spouse name: Not on file   Number of children: Not on file   Years of education: Not on file   Highest education level: Not on file  Occupational History   Not on file  Tobacco Use   Smoking status: Never   Smokeless tobacco: Never  Vaping Use   Vaping status: Never Used  Substance and Sexual Activity   Alcohol use: Yes    Comment: socially   Drug use: No   Sexual activity: Yes    Birth control/protection: None    Comment: PARAGARD  Other Topics Concern   Not on file  Social History Narrative   Not on file   Social Drivers of Health   Financial Resource Strain: Not on file  Food Insecurity: Not on file  Transportation Needs: Not on file  Physical Activity: Not on file  Stress: Not on file  Social Connections: Unknown (08/21/2021)   Received from [facility]   Social Network    Social Network: Not on file  Intimate Partner Violence: Unknown (07/13/2021)   Received from [facility], [facility]   HITS    Physically Hurt: Not on file  Insult or Talk Down To: Not on file    Threaten Physical Harm: Not on file    Scream or Curse: Not on file    Family History:  Family History  Problem Relation Age of Onset   Diabetes Maternal Aunt    Hypertension Mother     Medications Nyella Castro-Castro had no medications administered during this visit. Current Outpatient Medications  Medication Sig Dispense Refill   hydrochlorothiazide (HYDRODIURIL) 12.5 MG tablet Take 1 tablet (12.5 mg total) by mouth daily. (Patient taking differently: Take 25 mg by mouth daily.) 90 tablet 4   Prenatal Vit-Fe Phos-FA-Omega (VITAFOL GUMMIES) 3.33-0.333-34.8 MG CHEW Chew 1 tablet by mouth daily. 90 tablet 3   acetaminophen (TYLENOL) 500 MG tablet Take 1,000 mg by mouth every 6 (six) hours as needed for mild pain or  moderate pain. (Patient not taking: Reported on 06/04/2023)     nitrofurantoin, macrocrystal-monohydrate, (MACROBID) 100 MG capsule Take 1 capsule (100 mg total) by mouth 2 (two) times daily. (Patient not taking: Reported on 06/04/2023) 10 capsule 0   phenazopyridine (PYRIDIUM) 100 MG tablet Take 1 tablet (100 mg total) by mouth 3 (three) times daily as needed (urinary pain). (Patient not taking: Reported on 06/04/2023) 10 tablet 0   No current facility-administered medications for this visit.    Allergies Patient has no known allergies.   Physical Exam:  BP (!) 148/98   Pulse 75   Ht 5' (1.524 m)   Wt 125 lb 1.6 oz (56.7 kg)   BMI 24.43 kg/m  Body mass index is 24.43 kg/m. General appearance: Well nourished, well developed female in no acute distress.  Neck:  Supple, normal appearance, and no thyromegaly  Respiratory:   Normal respiratory effort Abdomen: positive bowel sounds and no masses, hernias; diffusely non tender to palpation, non distended Neuro/Psych:  Normal mood and affect.  Skin:  Warm and dry.   Laboratory: O POS  Radiology: bedside u/s with IUP that looks like around 9wks and no cardiac motion noted; doppler mode used. No signs of bleeding or SAB in progress  Assessment: patient stable  Plan:  1. Missed ab (Primary) Long d/w them re: diagnosis and options. I told them most likely due to [facility] with 40 or over increasing SAB and genetic issues risk but plenty of women have a normal pregnancy at age 33, if they want to try again.  After d/w them re: options, they elect for d&c in the OR. Request sent to [facility]. ED precuations given  2. Chronic hypertension complicating pregnancy, antepartum I told them the hydrochlorothiazide was fine  3. Multigravida of advanced maternal age in first trimester  4. Language barrier Ipad interpreter used   RTC post op  Cornelia Copa MD Attending Center for [employer] [address] [facility])

## 2023-06-04 NOTE — Telephone Encounter (Signed)
 Called patient to provide surgery details using interpreter services(ID#[id number]/[email or web address]). Left voicemail stating the patient is scheduled for surgery at [facility] on 06/05/23 at 12:30 pm and will need to arrive by 10:30 am.

## 2023-06-05 ENCOUNTER — Encounter (HOSPITAL_COMMUNITY): Payer: Self-pay | Admitting: Obstetrics & Gynecology

## 2023-06-05 ENCOUNTER — Ambulatory Visit (HOSPITAL_COMMUNITY)
Admission: RE | Admit: 2023-06-05 | Discharge: 2023-06-05 | Disposition: A | Discharge status: Home / Self Care | Payer: Self-pay | Attending: Obstetrics & Gynecology | Admitting: Obstetrics & Gynecology

## 2023-06-05 ENCOUNTER — Other Ambulatory Visit: Payer: Self-pay

## 2023-06-05 ENCOUNTER — Encounter (HOSPITAL_COMMUNITY)
Admission: RE | Disposition: A | Discharge status: Home / Self Care | Payer: Self-pay | Source: Home / Self Care | Attending: Obstetrics & Gynecology

## 2023-06-05 ENCOUNTER — Encounter: Payer: Self-pay | Admitting: Obstetrics & Gynecology

## 2023-06-05 ENCOUNTER — Ambulatory Visit (HOSPITAL_BASED_OUTPATIENT_CLINIC_OR_DEPARTMENT_OTHER): Payer: Self-pay | Admitting: Certified Registered Nurse Anesthetist

## 2023-06-05 ENCOUNTER — Ambulatory Visit (HOSPITAL_COMMUNITY): Payer: Self-pay | Admitting: Certified Registered Nurse Anesthetist

## 2023-06-05 ENCOUNTER — Other Ambulatory Visit (HOSPITAL_COMMUNITY): Payer: Self-pay | Admitting: Obstetrics & Gynecology

## 2023-06-05 ENCOUNTER — Ambulatory Visit (HOSPITAL_COMMUNITY)
Admission: RE | Admit: 2023-06-05 | Discharge: 2023-06-05 | Disposition: A | Discharge status: Home / Self Care | Payer: Self-pay | Source: Ambulatory Visit | Attending: Obstetrics & Gynecology | Admitting: Obstetrics & Gynecology

## 2023-06-05 DIAGNOSIS — Z8249 Family history of ischemic heart disease and other diseases of the circulatory system: Secondary | ICD-10-CM | POA: Insufficient documentation

## 2023-06-05 DIAGNOSIS — O10913 Unspecified pre-existing hypertension complicating pregnancy, third trimester: Secondary | ICD-10-CM

## 2023-06-05 DIAGNOSIS — O021 Missed abortion: Secondary | ICD-10-CM

## 2023-06-05 DIAGNOSIS — Z3A09 9 weeks gestation of pregnancy: Secondary | ICD-10-CM

## 2023-06-05 DIAGNOSIS — I10 Essential (primary) hypertension: Secondary | ICD-10-CM | POA: Insufficient documentation

## 2023-06-05 HISTORY — PX: DILATION AND EVACUATION: SHX1459

## 2023-06-05 HISTORY — DX: Unspecified abnormal cytological findings in specimens from cervix uteri: R87.619

## 2023-06-05 HISTORY — PX: OPERATIVE ULTRASOUND: SHX5996

## 2023-06-05 LAB — BASIC METABOLIC PANEL
Anion gap: 11 (ref 5–15)
BUN: 8 mg/dL (ref 6–20)
CO2: 18 mmol/L — ABNORMAL LOW (ref 22–32)
Calcium: 8.9 mg/dL (ref 8.9–10.3)
Chloride: 108 mmol/L (ref 98–111)
Creatinine, Ser: 0.62 mg/dL (ref 0.44–1.00)
GFR, Estimated: >60 mL/min (ref >60)
Glucose, Bld: 80 mg/dL (ref 70–99)
Potassium: 3.7 mmol/L (ref 3.5–5.1)
Sodium: 137 mmol/L (ref 135–145)

## 2023-06-05 LAB — TYPE AND SCREEN
ABO/RH(D): O POS
Antibody Screen: NEGATIVE

## 2023-06-05 LAB — CBC
HCT: 37.7 % (ref 36.0–46.0)
Hemoglobin: 11.6 g/dL — ABNORMAL LOW (ref 12.0–15.0)
MCH: 21.6 pg — ABNORMAL LOW (ref 26.0–34.0)
MCHC: 30.8 g/dL (ref 30.0–36.0)
MCV: 70.2 fL — ABNORMAL LOW (ref 80.0–100.0)
Platelets: 234 10*3/uL (ref 150–400)
RBC: 5.37 MIL/uL — ABNORMAL HIGH (ref 3.87–5.11)
RDW: 26.7 % — ABNORMAL HIGH (ref 11.5–15.5)
WBC: 7.7 10*3/uL (ref 4.0–10.5)
nRBC: 0 % (ref 0.0–0.2)

## 2023-06-05 SURGERY — DILATION AND EVACUATION, UTERUS
Anesthesia: General | Site: Vagina

## 2023-06-05 MED ORDER — GABAPENTIN 300 MG PO CAPS
ORAL_CAPSULE | ORAL | Status: AC
  Filled 2023-06-05: qty 1

## 2023-06-05 MED ORDER — BUPIVACAINE HCL (PF) 0.5 % IJ SOLN
INTRAMUSCULAR | Status: AC
  Filled 2023-06-05: qty 30

## 2023-06-05 MED ORDER — CARBOPROST TROMETHAMINE 250 MCG/ML IM SOLN
INTRAMUSCULAR | Status: AC
Start: 2023-06-05 — End: ?
  Filled 2023-06-05: qty 1

## 2023-06-05 MED ORDER — GABAPENTIN 300 MG PO CAPS
300.0000 mg | ORAL_CAPSULE | ORAL | Status: AC
  Administered 2023-06-05: 300 mg via ORAL

## 2023-06-05 MED ORDER — OXYCODONE HCL 5 MG PO TABS
5.0000 mg | ORAL_TABLET | ORAL | 0 refills | Status: DC | PRN
Start: 2023-06-05 — End: 2023-06-12

## 2023-06-05 MED ORDER — ORAL CARE MOUTH RINSE: 15.0000 mL | Freq: Once | OROMUCOSAL | Status: DC

## 2023-06-05 MED ORDER — MIDAZOLAM HCL 2 MG/2ML IJ SOLN
INTRAMUSCULAR | Status: AC
  Filled 2023-06-05: qty 2

## 2023-06-05 MED ORDER — ONDANSETRON HCL 4 MG/2ML IJ SOLN
INTRAMUSCULAR | Status: DC | PRN
  Administered 2023-06-05: 4 mg via INTRAVENOUS

## 2023-06-05 MED ORDER — OXYCODONE HCL 5 MG/5ML PO SOLN: 5.0000 mg | Freq: Once | ORAL | Status: DC | PRN

## 2023-06-05 MED ORDER — MIDAZOLAM HCL 2 MG/2ML IJ SOLN
INTRAMUSCULAR | Status: DC | PRN
  Administered 2023-06-05: 2 mg via INTRAVENOUS

## 2023-06-05 MED ORDER — CELECOXIB 200 MG PO CAPS
400.0000 mg | ORAL_CAPSULE | ORAL | Status: AC
  Administered 2023-06-05: 400 mg via ORAL

## 2023-06-05 MED ORDER — IBUPROFEN 800 MG PO TABS: 800.0000 mg | ORAL_TABLET | Freq: Three times a day (TID) | ORAL | 3 refills | Status: DC | PRN

## 2023-06-05 MED ORDER — MEPERIDINE HCL 25 MG/ML IJ SOLN: 6.2500 mg | INTRAMUSCULAR | Status: DC | PRN

## 2023-06-05 MED ORDER — EPHEDRINE 5 MG/ML INJ
INTRAVENOUS | Status: AC
  Filled 2023-06-05: qty 5

## 2023-06-05 MED ORDER — KETOROLAC TROMETHAMINE 30 MG/ML IJ SOLN
INTRAMUSCULAR | Status: AC
  Filled 2023-06-05: qty 1

## 2023-06-05 MED ORDER — DEXAMETHASONE SODIUM PHOSPHATE 10 MG/ML IJ SOLN
INTRAMUSCULAR | Status: DC | PRN
  Administered 2023-06-05: 8 mg via INTRAVENOUS

## 2023-06-05 MED ORDER — CHLORHEXIDINE GLUCONATE 0.12 % MT SOLN: 15.0000 mL | Freq: Once | OROMUCOSAL | Status: DC

## 2023-06-05 MED ORDER — ACETAMINOPHEN 500 MG PO TABS
1000.0000 mg | ORAL_TABLET | ORAL | Status: AC
  Administered 2023-06-05: 1000 mg via ORAL

## 2023-06-05 MED ORDER — LACTATED RINGERS IV SOLN: INTRAVENOUS | Status: DC

## 2023-06-05 MED ORDER — ACETAMINOPHEN 500 MG PO TABS
ORAL_TABLET | ORAL | Status: AC
  Filled 2023-06-05: qty 2

## 2023-06-05 MED ORDER — MISOPROSTOL 200 MCG PO TABS
ORAL_TABLET | ORAL | Status: AC
  Filled 2023-06-05: qty 5

## 2023-06-05 MED ORDER — DROPERIDOL 2.5 MG/ML IJ SOLN: 0.6250 mg | Freq: Once | INTRAMUSCULAR | Status: DC | PRN

## 2023-06-05 MED ORDER — TRANEXAMIC ACID-NACL 1000-0.7 MG/100ML-% IV SOLN
INTRAVENOUS | Status: AC
  Filled 2023-06-05: qty 100

## 2023-06-05 MED ORDER — METHYLERGONOVINE MALEATE 0.2 MG/ML IJ SOLN
INTRAMUSCULAR | Status: AC
  Filled 2023-06-05: qty 1

## 2023-06-05 MED ORDER — PROPOFOL 10 MG/ML IV BOLUS
INTRAVENOUS | Status: DC | PRN
  Administered 2023-06-05: 200 mg via INTRAVENOUS

## 2023-06-05 MED ORDER — BUPIVACAINE HCL 0.5 % IJ SOLN
INTRAMUSCULAR | Status: DC | PRN
  Administered 2023-06-05: 30 mL

## 2023-06-05 MED ORDER — 0.9 % SODIUM CHLORIDE (POUR BTL) OPTIME
TOPICAL | Status: DC | PRN
Start: 2023-06-05 — End: 2023-06-05
  Administered 2023-06-05: 1000 mL

## 2023-06-05 MED ORDER — HYDROMORPHONE HCL 1 MG/ML IJ SOLN: 0.2500 mg | INTRAMUSCULAR | Status: DC | PRN

## 2023-06-05 MED ORDER — DOCUSATE SODIUM 100 MG PO CAPS: 100.0000 mg | ORAL_CAPSULE | Freq: Two times a day (BID) | ORAL | 2 refills | Status: DC | PRN

## 2023-06-05 MED ORDER — FENTANYL CITRATE (PF) 250 MCG/5ML IJ SOLN
INTRAMUSCULAR | Status: AC
  Filled 2023-06-05: qty 5

## 2023-06-05 MED ORDER — FENTANYL CITRATE (PF) 250 MCG/5ML IJ SOLN
INTRAMUSCULAR | Status: DC | PRN
  Administered 2023-06-05: 25 ug via INTRAVENOUS

## 2023-06-05 MED ORDER — CELECOXIB 200 MG PO CAPS
ORAL_CAPSULE | ORAL | Status: AC
  Filled 2023-06-05: qty 2

## 2023-06-05 MED ORDER — OXYCODONE HCL 5 MG PO TABS: 5.0000 mg | ORAL_TABLET | Freq: Once | ORAL | Status: DC | PRN

## 2023-06-05 MED ORDER — EPHEDRINE SULFATE-NACL 50-0.9 MG/10ML-% IV SOSY
PREFILLED_SYRINGE | INTRAVENOUS | Status: DC | PRN
  Administered 2023-06-05: 5 mg via INTRAVENOUS

## 2023-06-05 MED ORDER — LIDOCAINE 2% (20 MG/ML) 5 ML SYRINGE
INTRAMUSCULAR | Status: DC | PRN
  Administered 2023-06-05: 60 mg via INTRAVENOUS

## 2023-06-05 SURGICAL SUPPLY — 18 items
FILTER UTR ASPR ASSEMBLY (MISCELLANEOUS) ×2 IMPLANT
GLOVE BIOGEL PI IND STRL 7.0 (GLOVE) ×2 IMPLANT
GLOVE ECLIPSE 7.0 STRL STRAW (GLOVE) ×2 IMPLANT
GLOVE SURG SYN 6.5 ES PF (GLOVE) ×4 IMPLANT
GLOVE SURG SYN 6.5 PF PI (GLOVE) IMPLANT
GOWN STRL REUS W/ TWL LRG LVL3 (GOWN DISPOSABLE) ×4 IMPLANT
HOSE CONNECTING 18IN BERKELEY (TUBING) ×2 IMPLANT
KIT BERKELEY 1ST TRI 3/8 NO TR (MISCELLANEOUS) ×2 IMPLANT
KIT BERKELEY 1ST TRIMESTER 3/8 (MISCELLANEOUS) ×2 IMPLANT
NS IRRIG 1000ML POUR BTL (IV SOLUTION) ×2 IMPLANT
PACK VAGINAL MINOR WOMEN LF (CUSTOM PROCEDURE TRAY) ×2 IMPLANT
PAD OB MATERNITY 11 LF (PERSONAL CARE ITEMS) ×2 IMPLANT
SET BERKELEY SUCTION TUBING (SUCTIONS) ×2 IMPLANT
SOL PREP POV-IOD 4OZ 10% (MISCELLANEOUS) IMPLANT
SPIKE FLUID TRANSFER (MISCELLANEOUS) ×2 IMPLANT
TOWEL GREEN STERILE FF (TOWEL DISPOSABLE) ×4 IMPLANT
UNDERPAD 30X36 HEAVY ABSORB (UNDERPADS AND DIAPERS) ×2 IMPLANT
VACURETTE 10 RIGID CVD (CANNULA) IMPLANT

## 2023-06-05 NOTE — Op Note (Signed)
 Carl Castro-Castro PROCEDURE DATE:  06/05/2023  PREOPERATIVE DIAGNOSIS: Intrauterine fetal demise around ten weeks gestation POSTOPERATIVE DIAGNOSIS: The same PROCEDURE:  Dilation and Evacuation under ultrasound guidance SURGEON:  Dr. Jaynie Collins  INDICATIONS: 43 y.o.  [id number] with intrauterine fetal demise around ten weeks gestation, who desires surgical management.  Risks of surgery were discussed with the patient including but not limited to: bleeding which may require transfusion; infection which may require antibiotics; injury to uterus or surrounding organs; need for additional procedures including laparotomy or laparoscopy; possibility of intrauterine scarring which may impair future fertility; and other postoperative/anesthesia complications. Written informed consent was obtained.  FINDINGS:   Intrauterine fetal demise around ten weeks gestation. Significant amount of products of conception.  Empty endometrial stripe noted on ultrasound at the end of the procedure.   ANESTHESIA:    General, paracervical block with 30 ml of 0.5% Marcaine ESTIMATED BLOOD LOSS:  25 ml. SPECIMENS:  Products of conception sent to pathology and some products also sent for microarray analysis Kizzie Fantasia) COMPLICATIONS:  None immediate.  PROCEDURE DETAILS:  The patient received intravenous Doxycycline while in the preoperative area.  She was then taken to the operating room where anesthesia  was administered and was found to be adequate.  After an adequate timeout was performed, she was placed in the dorsal lithotomy position and examined; then prepped and draped in the sterile manner.   Her bladder was catheterized for an unmeasured amount of clear, yellow urine. A vaginal speculum was then placed in the patient's vagina and a single tooth tenaculum was applied to the anterior lip of the cervix.  A paracervical block using 30 ml of 0.5% Marcaine was administered. The cervix was gently dilated under ultrasound  guidance to accommodate a 10 mm suction curette that was gently advanced to the uterine fundus.  The suction device was then activated and curette slowly rotated to clear the uterus of products of conception.   There was an empty endometrial stripe noted on the ultrasound at the end of the curettage. There was minimal bleeding noted at the end of the procedure, and the tenaculum removed with good hemostasis noted.   All instruments were removed from the patient's vagina.  Sponge and instrument counts were correct times three.  The patient tolerated the procedure well and was taken to the recovery area awake, extubated and in stable condition.  The patient will be discharged to home as per PACU criteria.  Routine postoperative instructions given.  She was prescribed Oxycodone, Ibuprofen and Colace.  She will follow up in the office in 2-3 weeks for postoperative evaluation.   Jaynie Collins, MD, FACOG Obstetrician & Gynecologist, [facility] for [employer], [facility]

## 2023-06-05 NOTE — Anesthesia Preprocedure Evaluation (Signed)
 Anesthesia Evaluation  Patient identified by MRN, date of birth, ID band Patient awake    Reviewed: Allergy & Precautions, NPO status , Patient's Chart, lab work & pertinent test results  Airway Mallampati: II  TM Distance: >3 FB Neck ROM: Full    Dental no notable dental hx. (+) Dental Advisory Given, Teeth Intact   Pulmonary neg pulmonary ROS   Pulmonary exam normal breath sounds clear to auscultation       Cardiovascular hypertension, Normal cardiovascular exam Rhythm:Regular Rate:Normal     Neuro/Psych negative neurological ROS     GI/Hepatic negative GI ROS, Neg liver ROS,,,  Endo/Other  negative endocrine ROS    Renal/GU negative Renal ROS     Musculoskeletal negative musculoskeletal ROS (+)    Abdominal   Peds  Hematology negative hematology ROS (+)   Anesthesia Other Findings   Reproductive/Obstetrics                              Anesthesia Physical Anesthesia Plan  ASA: 2  Anesthesia Plan: General   Post-op Pain Management: Tylenol PO (pre-op)*, Celebrex PO (pre-op)* and Gabapentin PO (pre-op)*   Induction: Intravenous  PONV Risk Score and Plan: 4 or greater and Ondansetron, Dexamethasone, Treatment may vary due to age or medical condition and Midazolam  Airway Management Planned: LMA  Additional Equipment:   Intra-op Plan:   Post-operative Plan: Extubation in OR  Informed Consent: I have reviewed the patients History and Physical, chart, labs and discussed the procedure including the risks, benefits and alternatives for the proposed anesthesia with the patient or authorized representative who has indicated his/her understanding and acceptance.     Dental advisory given  Plan Discussed with: CRNA  Anesthesia Plan Comments:          Anesthesia Quick Evaluation

## 2023-06-05 NOTE — Anesthesia Postprocedure Evaluation (Signed)
 Anesthesia Post Note  Patient: Gabrielle Park  Procedure(s) Performed: DILATATION AND EVACUATION (Vagina ) OPERATIVE ULTRASOUND (Abdomen) CHROMOSOME STUDIES (Vagina )     Patient location during evaluation: PACU Anesthesia Type: General Level of consciousness: sedated and patient cooperative Pain management: pain level controlled Vital Signs Assessment: post-procedure vital signs reviewed and stable Respiratory status: spontaneous breathing Cardiovascular status: stable Anesthetic complications: no  No notable events documented.  Last Vitals:  Vitals:   06/05/23 1337 06/05/23 1415  BP: (!) 150/86   Pulse:    Resp:    Temp:  36.4 C  SpO2:      Last Pain:  Vitals:   06/05/23 1345  TempSrc:   PainSc: 0-No pain                 Lewie Loron

## 2023-06-05 NOTE — Anesthesia Procedure Notes (Signed)
 Procedure Name: LMA Insertion Date/Time: 06/05/2023 12:52 PM  Performed by: Little Ishikawa, CRNAPre-anesthesia Checklist: Patient identified, Emergency Drugs available, Suction available, Timeout performed and Patient being monitored Patient Re-evaluated:Patient Re-evaluated prior to induction Oxygen Delivery Method: Circle system utilized Preoxygenation: Pre-oxygenation with 100% oxygen Induction Type: IV induction Ventilation: Mask ventilation without difficulty LMA: LMA inserted LMA Size: 4.0 Tube type: Oral Placement Confirmation: positive ETCO2, CO2 detector and breath sounds checked- equal and bilateral Tube secured with: Tape Dental Injury: Teeth and Oropharynx as per pre-operative assessment

## 2023-06-05 NOTE — Interval H&P Note (Signed)
 History and Physical Interval Note 06/05/2023 12:27 PM  Patient is Spanish-speaking only, interpreter present for this encounter.  Gabrielle Park has presented today for surgery, with the diagnosis of missed abortion.  The various methods of treatment have been discussed with the patient and family. After consideration of risks, benefits and other options for treatment, the patient has consented to  Procedure(s): DILATATION AND EVACUATION, OPERATIVE ULTRASOUND with CHROMOSOME STUDIES as a surgical intervention.  Of note, risks discussed included but are not limited to:  bleeding, infection, injury to surrounding organs, need for additional procedures, possibility of intrauterine scarring which may impair future fertility, risk of retained products which may require further management and other postoperative/anesthesia complications were explained to patient.  Likelihood of success of complete evacuation of the uterus was discussed with the patient.  Written informed consent was obtained.   Preoperative prophylactic Doxycycline 200mg  IV  has been ordered and is on call to the OR.   The patient's history has been reviewed, patient examined, no change in status, stable for surgery.  I have reviewed the patient's chart and labs.  Questions were answered to the patient's satisfaction.  To OR when ready.    Jaynie Collins, MD, FACOG Obstetrician & Gynecologist, [facility] for [employer], [facility]

## 2023-06-05 NOTE — Transfer of Care (Signed)
 Immediate Anesthesia Transfer of Care Note  Patient: Gabrielle Park  Procedure(s) Performed: DILATATION AND EVACUATION (Vagina ) OPERATIVE ULTRASOUND (Abdomen) CHROMOSOME STUDIES (Vagina )  Patient Location: PACU  Anesthesia Type:General  Level of Consciousness: awake and alert   Airway & Oxygen Therapy: Patient Spontanous Breathing  Post-op Assessment: Report given to RN and Post -op Vital signs reviewed and stable  Post vital signs: Reviewed and stable  Last Vitals:  Vitals Value Taken Time  BP 121/73 06/05/23 1323  Temp    Pulse 57 06/05/23 1326  Resp 12 06/05/23 1326  SpO2 100 % 06/05/23 1326  Vitals shown include unfiled device data.  Last Pain:  Vitals:   06/05/23 1139  TempSrc: Oral  PainSc: 0-No pain      Patients Stated Pain Goal: 5 (06/05/23 1139)  Complications: No notable events documented.

## 2023-06-06 ENCOUNTER — Encounter (HOSPITAL_COMMUNITY): Payer: Self-pay | Admitting: Obstetrics & Gynecology

## 2023-06-06 LAB — SURGICAL PATHOLOGY

## 2023-06-12 ENCOUNTER — Other Ambulatory Visit: Payer: Self-pay

## 2023-06-12 ENCOUNTER — Ambulatory Visit (INDEPENDENT_AMBULATORY_CARE_PROVIDER_SITE_OTHER): Payer: Self-pay | Admitting: Family Medicine

## 2023-06-12 ENCOUNTER — Encounter: Payer: Self-pay | Admitting: Family Medicine

## 2023-06-12 VITALS — BP 167/98 | HR 59 | Wt 124.6 lb

## 2023-06-12 DIAGNOSIS — Z3009 Encounter for other general counseling and advice on contraception: Secondary | ICD-10-CM

## 2023-06-12 DIAGNOSIS — I10 Essential (primary) hypertension: Secondary | ICD-10-CM

## 2023-06-12 DIAGNOSIS — O10919 Unspecified pre-existing hypertension complicating pregnancy, unspecified trimester: Secondary | ICD-10-CM | POA: Insufficient documentation

## 2023-06-12 DIAGNOSIS — Z3A10 10 weeks gestation of pregnancy: Secondary | ICD-10-CM

## 2023-06-12 DIAGNOSIS — O021 Missed abortion: Secondary | ICD-10-CM

## 2023-06-12 DIAGNOSIS — K5900 Constipation, unspecified: Secondary | ICD-10-CM

## 2023-06-12 MED ORDER — POLYETHYLENE GLYCOL 3350 17 GM/SCOOP PO POWD: 17.0000 g | Freq: Every day | ORAL | 1 refills | Status: DC | PRN

## 2023-06-12 MED ORDER — NORETHINDRONE 0.35 MG PO TABS
1.0000 | ORAL_TABLET | Freq: Every day | ORAL | 11 refills | Status: DC
Start: 2023-06-12 — End: 2023-09-17

## 2023-06-12 NOTE — Assessment & Plan Note (Signed)
 Unclear if she is actually taking her hydrochlorothiazide. Recommended she see PCP asap to discuss modification to her regimen.

## 2023-06-12 NOTE — Assessment & Plan Note (Signed)
 Some LLQ pain, suspect secondary to constipation, rx sent for miralax. Case done with US guidance, low suspicion for any sort of procedural complication. Reviewed pathology results and that currently we do not have definitive diagnosis, will contact once results are available. Discussed contraception at length, has trialed many types. Reviewed that given hypertension and age would not recommend OCP's and that POP's are an option but are very unforgiving. She did not like copper IUD because of cramping and bleeding, discussed she may like hormonal IUD a lot better. Ultimately chose POPs but is aware we are here if she changes her mind.

## 2023-06-12 NOTE — Progress Notes (Signed)
 GYNECOLOGY OFFICE VISIT NOTE  History:   Gabrielle Park is a 43 y.o. [id number] here today for post op follow up.  Diagnosed with missed AB at [facility] last month Seen by Dr. Vergie Living on 06/05/2023 and recommended for [facility] Had uncomplicated procedure on 06/05/2023 with Dr. Macon Large Pathology showed unremarkable POC's without evidence of GTD [facility] pathology returned today but a result was unable to be given due to insufficient fetal DNA as well as maternal cell contamination  Patient reports doing well since procedure while she still had oxycodone, but ran out two days prior (rx was for 15 tabs) and has been having increased cramping Mainly in LLQ Endorses constipation Gets better with ibuprofen Has tried many types of injection, nexplanon, copper T, didn't like any of them Would like to try pills   Health Maintenance Due  Topic Date Due   Hepatitis C Screening  Never done   INFLUENZA VACCINE  11/07/2022   COVID-19 Vaccine (1 - 2024-25 season) Never done    Past Medical History:  Diagnosis Date   Abnormal cervical Papanicolaou smear    History of severe pre-eclampsia 03/05/2016   Hypertension     Past Surgical History:  Procedure Laterality Date   DILATION AND EVACUATION N/A 06/05/2023   Procedure: DILATATION AND EVACUATION;  Surgeon: Tereso Newcomer, MD;  Location: [facility] OR;  Service: Gynecology;  Laterality: N/A;   NO PAST SURGERIES     OPERATIVE ULTRASOUND N/A 06/05/2023   Procedure: OPERATIVE ULTRASOUND;  Surgeon: Tereso Newcomer, MD;  Location: [facility] OR;  Service: Gynecology;  Laterality: N/A;    The following portions of the patient's history were reviewed and updated as appropriate: allergies, current medications, past family history, past medical history, past social history, past surgical history and problem list.   Health Maintenance:   Last pap: Result Date Procedure Results Follow-ups  10/21/2019 Cytology - PAP Adequacy: Satisfactory for evaluation; transformation  zone component PRESENT. Diagnosis: - Negative for intraepithelial lesion or malignancy (NILM)   03/23/2019 Cytology - PAP High risk HPV: Negative Adequacy: Satisfactory for evaluation; transformation zone component PRESENT. Diagnosis: - Negative for intraepithelial lesion or malignancy (NILM) Comment: Normal Reference Range HPV - Negative   07/22/2014 Cytology - PAP    03/03/2012 Cytology - PAP     *referred to [facility]*  Last mammogram:  *referred to [facility]*    Review of Systems:  Pertinent items noted in HPI and remainder of comprehensive ROS otherwise negative.  Physical Exam:  BP (!) 167/98   Pulse (!) 59   Wt 124 lb 9.6 oz (56.5 kg)   Breastfeeding No   BMI 24.33 kg/m  CONSTITUTIONAL: Well-developed, well-nourished female in no acute distress.  HEENT:  Normocephalic, atraumatic. External right and left ear normal. No scleral icterus.  NECK: Normal range of motion, supple, no masses noted on observation SKIN: No rash noted. Not diaphoretic. No erythema. No pallor. MUSCULOSKELETAL: Normal range of motion. No edema noted. NEUROLOGIC: Alert and oriented to person, place, and time. Normal muscle tone coordination.  PSYCHIATRIC: Normal mood and affect. Normal behavior. Normal judgment and thought content. RESPIRATORY: Effort normal, no problems with respiration noted  Labs and Imaging Results for orders placed or performed during the hospital encounter of 06/05/23 (from the past week)  Anora Miscarriage Test - Fresh   Collection Time: 06/12/23 11:01 AM  Result Value Ref Range   AMT RESULT See Notes    FOOTNOTES See Notes    [place] Intraoperative Result Date: 06/05/2023 CLINICAL DATA:  Ultrasound  was provided for use by the ordering physician.  No provider Interpretation or professional fees incurred.       Assessment and Plan:   Problem List Items Addressed This Visit       Cardiovascular and Mediastinum   Essential hypertension   Unclear if she is actually taking her  hydrochlorothiazide. Recommended she see PCP asap to discuss modification to her regimen.         Other   Missed abortion measuring around ten weeks gestation   Some LLQ pain, suspect secondary to constipation, rx sent for miralax. Case done with US guidance, low suspicion for any sort of procedural complication. Reviewed pathology results and that currently we do not have definitive diagnosis, will contact once results are available. Discussed contraception at length, has trialed many types. Reviewed that given hypertension and age would not recommend OCP's and that POP's are an option but are very unforgiving. She did not like copper IUD because of cramping and bleeding, discussed she may like hormonal IUD a lot better. Ultimately chose POPs but is aware we are here if she changes her mind.       Other Visit Diagnoses       Constipation, unspecified constipation type    -  Primary   Relevant Medications   polyethylene glycol powder (GLYCOLAX/MIRALAX) 17 GM/SCOOP powder     Counseling for birth control, oral contraceptives       Relevant Medications   norethindrone (MICRONOR) 0.35 MG tablet       Routine preventative health maintenance measures emphasized. Please refer to After Visit Summary for other counseling recommendations.   Return for as needed.    Total face-to-face time with patient: 20 minutes.  Over 50% of encounter was spent on counseling and coordination of care.   Venora Maples, MD/MPH Attending Family Medicine Physician, [facility] for [facility], [facility]

## 2023-06-17 LAB — ANORA MISCARRIAGE TEST - FRESH

## 2023-06-19 ENCOUNTER — Telehealth: Payer: Self-pay

## 2023-06-19 NOTE — Telephone Encounter (Signed)
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 Patient needs follow up appointment in office (I see the one on 06/25/23 was cancelled?) Thank you!  Please call to inform patient of results and recommendations. She is Spanish speaking only.   Jaynie Collins, MD

## 2023-06-19 NOTE — Telephone Encounter (Signed)
 Called Pt to go over [facility] test results , no answer, left VM for call back.

## 2023-06-25 ENCOUNTER — Ambulatory Visit: Payer: Self-pay | Admitting: Obstetrics & Gynecology

## 2023-07-16 ENCOUNTER — Ambulatory Visit: Payer: Self-pay | Admitting: Obstetrics and Gynecology

## 2023-08-11 ENCOUNTER — Emergency Department (HOSPITAL_COMMUNITY)
Admission: EM | Admit: 2023-08-11 | Discharge: 2023-08-11 | Disposition: A | Discharge status: Home / Self Care | Payer: Self-pay | Attending: Emergency Medicine | Admitting: Emergency Medicine

## 2023-08-11 ENCOUNTER — Emergency Department (HOSPITAL_COMMUNITY): Payer: Self-pay

## 2023-08-11 ENCOUNTER — Encounter (HOSPITAL_COMMUNITY): Payer: Self-pay | Admitting: Emergency Medicine

## 2023-08-11 DIAGNOSIS — Z79899 Other long term (current) drug therapy: Secondary | ICD-10-CM | POA: Insufficient documentation

## 2023-08-11 DIAGNOSIS — I10 Essential (primary) hypertension: Secondary | ICD-10-CM | POA: Insufficient documentation

## 2023-08-11 DIAGNOSIS — R519 Headache, unspecified: Secondary | ICD-10-CM | POA: Insufficient documentation

## 2023-08-11 DIAGNOSIS — M5412 Radiculopathy, cervical region: Secondary | ICD-10-CM | POA: Insufficient documentation

## 2023-08-11 LAB — COMPREHENSIVE METABOLIC PANEL WITH GFR
ALT: 17 U/L (ref 0–44)
AST: 30 U/L (ref 15–41)
Albumin: 3.9 g/dL (ref 3.5–5.0)
Alkaline Phosphatase: 76 U/L (ref 38–126)
Anion gap: 14 (ref 5–15)
BUN: 17 mg/dL (ref 6–20)
CO2: 24 mmol/L (ref 22–32)
Calcium: 9 mg/dL (ref 8.9–10.3)
Chloride: 100 mmol/L (ref 98–111)
Creatinine, Ser: 1.04 mg/dL — ABNORMAL HIGH (ref 0.44–1.00)
GFR, Estimated: >60 mL/min (ref >60)
Glucose, Bld: 132 mg/dL — ABNORMAL HIGH (ref 70–99)
Potassium: 3.8 mmol/L (ref 3.5–5.1)
Sodium: 138 mmol/L (ref 135–145)
Total Bilirubin: 0.4 mg/dL (ref 0.0–1.2)
Total Protein: 7.4 g/dL (ref 6.5–8.1)

## 2023-08-11 LAB — CBC
HCT: 36.2 % (ref 36.0–46.0)
Hemoglobin: 11 g/dL — ABNORMAL LOW (ref 12.0–15.0)
MCH: 22.9 pg — ABNORMAL LOW (ref 26.0–34.0)
MCHC: 30.4 g/dL (ref 30.0–36.0)
MCV: 75.3 fL — ABNORMAL LOW (ref 80.0–100.0)
Platelets: 290 10*3/uL (ref 150–400)
RBC: 4.81 MIL/uL (ref 3.87–5.11)
RDW: 16.2 % — ABNORMAL HIGH (ref 11.5–15.5)
WBC: 9.5 10*3/uL (ref 4.0–10.5)
nRBC: 0 % (ref 0.0–0.2)

## 2023-08-11 MED ORDER — CYCLOBENZAPRINE HCL 10 MG PO TABS: 5.0000 mg | ORAL_TABLET | Freq: Every day | ORAL | 0 refills | Status: DC

## 2023-08-11 MED ORDER — CYCLOBENZAPRINE HCL 10 MG PO TABS
10.0000 mg | ORAL_TABLET | Freq: Once | ORAL | Status: AC
  Administered 2023-08-11: 10 mg via ORAL
  Filled 2023-08-11: qty 1

## 2023-08-11 NOTE — ED Provider Notes (Signed)
 [place] EMERGENCY DEPARTMENT AT [facility] Provider Note   CSN: [id number] Arrival date & time: 08/11/23  1426     History Chief Complaint  Patient presents with   Headache    Gabrielle Park is a 43 y.o. female.  Patient with past history of recent miscarriage, and hypertension presents to the emergency department today with concerns of a headache and tingling and numbness in bilateral upper extremities.  Patient reports has been ongoing and worsening over the last several days.  Denies any recent injury or trauma.  She states that the neck discomfort developed prior to the headache.  No recent fever, chills or bodyaches.  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   Headache      Home Medications Prior to Admission medications   Medication Sig Start Date End Date Taking? Authorizing Provider  cyclobenzaprine (FLEXERIL) 10 MG tablet Take 0.5 tablets (5 mg total) by mouth at bedtime. 08/11/23  Yes Carmita Boom A, PA-C  docusate sodium  (COLACE) 100 MG capsule Take 1 capsule (100 mg total) by mouth 2 (two) times daily as needed for mild constipation or moderate constipation. 06/05/23   Anyanwu, Ugonna A, MD  hydrochlorothiazide  (HYDRODIURIL ) 12.5 MG tablet Take 1 tablet (12.5 mg total) by mouth daily. 03/07/16   Anyanwu, Ugonna A, MD  norethindrone  (MICRONOR ) 0.35 MG tablet Take 1 tablet (0.35 mg total) by mouth daily. 06/12/23   Teena Feast, MD  polyethylene glycol powder (GLYCOLAX /MIRALAX ) 17 GM/SCOOP powder Take 17 g by mouth daily as needed. 06/12/23   Teena Feast, MD      Allergies    Patient has no known allergies.    Review of Systems   Review of Systems  Neurological:  Positive for headaches.  All other systems reviewed and are negative.   Physical Exam Updated Vital Signs BP 128/77   Pulse 64   Temp 98.7 F (37.1 C)   Resp 14   SpO2 99%  Physical Exam Vitals and nursing note reviewed.  Constitutional:      General: She is not in  acute distress.    Appearance: She is well-developed.  HENT:     Head: Normocephalic and atraumatic.  Eyes:     Conjunctiva/sclera: Conjunctivae normal.  Cardiovascular:     Rate and Rhythm: Normal rate and regular rhythm.     Heart sounds: No murmur heard. Pulmonary:     Effort: Pulmonary effort is normal. No respiratory distress.     Breath sounds: Normal breath sounds.  Abdominal:     Palpations: Abdomen is soft.     Tenderness: There is no abdominal tenderness.  Musculoskeletal:        General: No swelling.     Cervical back: Neck supple.  Skin:    General: Skin is warm and dry.     Capillary Refill: Capillary refill takes less than 2 seconds.  Neurological:     Mental Status: She is alert.     Cranial Nerves: No cranial nerve deficit.     Comments: No facial droop, slurred speech, or dysarthria.  Roos test worsens radiating numbness bilaterally.  Psychiatric:        Mood and Affect: Mood normal.     ED Results / Procedures / Treatments   Labs (all labs ordered are listed, but only abnormal results are displayed) Labs Reviewed  CBC - Abnormal; Notable for the following components:      Result Value   Hemoglobin 11.0 (*)  MCV 75.3 (*)    MCH 22.9 (*)    RDW 16.2 (*)    All other components within normal limits  COMPREHENSIVE METABOLIC PANEL WITH GFR - Abnormal; Notable for the following components:   Glucose, Bld 132 (*)    Creatinine, Ser 1.04 (*)    All other components within normal limits    EKG None  Radiology CT Head Wo Contrast Result Date: 08/11/2023 CLINICAL DATA:  Neuro deficit, acute, stroke suspected; Cervical radiculopathy, no red flags EXAM: CT HEAD WITHOUT CONTRAST CT CERVICAL SPINE WITHOUT CONTRAST TECHNIQUE: Multidetector CT imaging of the head and cervical spine was performed following the standard protocol without intravenous contrast. Multiplanar CT image reconstructions of the cervical spine were also generated. RADIATION DOSE REDUCTION:  This exam was performed according to the departmental dose-optimization program which includes automated exposure control, adjustment of the mA and/or kV according to patient size and/or use of iterative reconstruction technique. COMPARISON:  None Available. FINDINGS: CT HEAD FINDINGS Brain: No evidence of acute infarction, hemorrhage, hydrocephalus, extra-axial collection or mass lesion/mass effect. Partially empty sella. Vascular: No hyperdense vessel identified. Skull: No acute fracture. Sinuses/Orbits: Clear sinuses.  No acute orbital findings. Other: No mastoid effusions. CT CERVICAL SPINE FINDINGS Alignment: Normal. Skull base and vertebrae: No acute fracture. No primary bone lesion or focal pathologic process. Soft tissues and spinal canal: No prevertebral fluid or swelling. No visible canal hematoma. Disc levels: Left eccentric disc bulge at C5-C6 with likely mild canal stenosis and effacement of the left foramen. Upper chest: Visualized lung apices are clear. IMPRESSION: 1. No evidence of acute abnormality intracranially or in the cervical spine. 2. Left eccentric disc bulge at C5-C6 with likely mild canal stenosis and effacement of the left foramen. An MRI could better assess the canal and foramina if clinically warranted. Electronically Signed   By: Stevenson Elbe M.D.   On: 08/11/2023 19:44   CT Cervical Spine Wo Contrast Result Date: 08/11/2023 CLINICAL DATA:  Neuro deficit, acute, stroke suspected; Cervical radiculopathy, no red flags EXAM: CT HEAD WITHOUT CONTRAST CT CERVICAL SPINE WITHOUT CONTRAST TECHNIQUE: Multidetector CT imaging of the head and cervical spine was performed following the standard protocol without intravenous contrast. Multiplanar CT image reconstructions of the cervical spine were also generated. RADIATION DOSE REDUCTION: This exam was performed according to the departmental dose-optimization program which includes automated exposure control, adjustment of the mA and/or kV  according to patient size and/or use of iterative reconstruction technique. COMPARISON:  None Available. FINDINGS: CT HEAD FINDINGS Brain: No evidence of acute infarction, hemorrhage, hydrocephalus, extra-axial collection or mass lesion/mass effect. Partially empty sella. Vascular: No hyperdense vessel identified. Skull: No acute fracture. Sinuses/Orbits: Clear sinuses.  No acute orbital findings. Other: No mastoid effusions. CT CERVICAL SPINE FINDINGS Alignment: Normal. Skull base and vertebrae: No acute fracture. No primary bone lesion or focal pathologic process. Soft tissues and spinal canal: No prevertebral fluid or swelling. No visible canal hematoma. Disc levels: Left eccentric disc bulge at C5-C6 with likely mild canal stenosis and effacement of the left foramen. Upper chest: Visualized lung apices are clear. IMPRESSION: 1. No evidence of acute abnormality intracranially or in the cervical spine. 2. Left eccentric disc bulge at C5-C6 with likely mild canal stenosis and effacement of the left foramen. An MRI could better assess the canal and foramina if clinically warranted. Electronically Signed   By: Stevenson Elbe M.D.   On: 08/11/2023 19:44    Procedures Procedures    Medications Ordered in ED Medications  cyclobenzaprine (FLEXERIL) tablet 10 mg (10 mg Oral Given 08/11/23 1733)    ED Course/ Medical Decision Making/ A&P                                 Medical Decision Making Amount and/or Complexity of Data Reviewed Labs: ordered. Radiology: ordered.  Risk Prescription drug management.   This patient presents to the ED for concern of headache, upper extremity tingling.  Differential diagnosis includes headache, migraine, cervical radiculopathy, thoracic outlet syndrome   Lab Tests:  I Ordered, and personally interpreted labs.  The pertinent results include: CBC and CMP unremarkable   Imaging Studies ordered:  I ordered imaging studies including CT head, CT cervical  spine I independently visualized and interpreted imaging which showed CT head unremarkable, CT cervical spine shows a left eccentric disc bulge at the C5-C6 level with some mild canal stenosis. I agree with the radiologist interpretation   Medicines ordered and prescription drug management:  I ordered medication including Flexeril for neck discomfort Reevaluation of the patient after these medicines showed that the patient improved I have reviewed the patients home medicines and have made adjustments as needed   Problem List / ED Course:  Patient without significant medical history presents the emergency department today with concerns of headache and neck pain with radiation to the upper extremities.  No history of migraine headaches but has had headaches previously.  Reports that she initially had neck pain rating towards bilateral arms with intermittent episodes of tingling numbness.  Denies any recent trauma or other injury to explain symptoms.  Has tried over-the-counter medications with minimal improvement. Exam reveals a positive Roos test concern for possible thoracic outlet syndrome although this is present bilaterally which is not typical.  Pupils are PERRL.  Head is otherwise atraumatic.  Will proceed with labs and imaging for assessment of current symptoms.  Flexeril ordered for symptom management. Labs unremarkable.  CT head negative.  CT cervical spine concerning for a left eccentric disc bulge at C5-C6 with some canal stenosis.  This would likely cause unilateral symptoms in the setting of bilateral symptoms, do not feel this is likely the current cause of her pain although would explain some left-sided symptoms.  Given patient's line of work, I do suspect is likely a slight overuse injury causing spasming present.  She reports significant proved after Flexeril.  Will plan to discharge patient with his medication with strict return precautions.  Advised close follow-up with primary care  provider.  Final Clinical Impression(s) / ED Diagnoses Final diagnoses:  Cervical radiculopathy  Acute nonintractable headache, unspecified headache type    Rx / DC Orders ED Discharge Orders          Ordered    cyclobenzaprine (FLEXERIL) 10 MG tablet  Daily at bedtime        08/11/23 2108              Zadrian Mccauley A, PA-C 08/11/23 2321    Tegeler, Marine Sia, MD 08/12/23 0045

## 2023-08-11 NOTE — Discharge Instructions (Addendum)
 Hoy lo atendieron en urgencias por dolor de cabeza y hormigueo en el brazo. Afortunadamente, sus anlisis e imgenes fueron tranquilizadores, y hay indicios de que la radiculopata cervical es la causa del entumecimiento y el hormigueo en el brazo. Tuvo cierta mejora con Flexeril, un relajante muscular. Envi esta receta a su farmacia para que la contine usando. Por favor, consulte con su mdico de cabecera para una evaluacin ms detallada. Si presenta sntomas nuevos o que empeoran, regrese a Oceanographer.  You were seen in the emergency department today for concerns of headaches and tingling in your arm.  Your labs and imaging were thankfully reassuring and there is some evidence to suggest cervical radiculopathy causing some of the numbness and tingling in your arm.  You did have some improvement with Flexeril which is a muscle relaxant.  I sent this prescription to your pharmacy for continued use.  Please follow-up with your primary care provider for further evaluation.  If you have any new or worsening symptoms, return to the emergency department.

## 2023-08-11 NOTE — ED Triage Notes (Signed)
 Pt here from home with c/o neck pain and headache along with bil hand and feet tingling , no trauma noted

## 2023-08-11 NOTE — ED Notes (Signed)
Pt to and from imaging

## 2023-09-17 ENCOUNTER — Inpatient Hospital Stay (HOSPITAL_COMMUNITY): Payer: Self-pay

## 2023-09-17 ENCOUNTER — Inpatient Hospital Stay (HOSPITAL_COMMUNITY)
Admission: AD | Admit: 2023-09-17 | Discharge: 2023-09-17 | Disposition: A | Discharge status: Home / Self Care | Payer: Self-pay | Attending: Obstetrics and Gynecology | Admitting: Obstetrics and Gynecology

## 2023-09-17 ENCOUNTER — Encounter (HOSPITAL_COMMUNITY): Payer: Self-pay | Admitting: Obstetrics and Gynecology

## 2023-09-17 ENCOUNTER — Other Ambulatory Visit (HOSPITAL_COMMUNITY): Payer: Self-pay | Admitting: Internal Medicine

## 2023-09-17 DIAGNOSIS — R102 Pelvic and perineal pain: Secondary | ICD-10-CM

## 2023-09-17 DIAGNOSIS — O99011 Anemia complicating pregnancy, first trimester: Secondary | ICD-10-CM

## 2023-09-17 DIAGNOSIS — I10 Essential (primary) hypertension: Secondary | ICD-10-CM

## 2023-09-17 DIAGNOSIS — O26891 Other specified pregnancy related conditions, first trimester: Secondary | ICD-10-CM

## 2023-09-17 DIAGNOSIS — Z3A01 Less than 8 weeks gestation of pregnancy: Secondary | ICD-10-CM

## 2023-09-17 DIAGNOSIS — O3680X0 Pregnancy with inconclusive fetal viability, not applicable or unspecified: Secondary | ICD-10-CM

## 2023-09-17 HISTORY — DX: Anemia, unspecified: D64.9

## 2023-09-17 LAB — CBC
HCT: 31.5 % — ABNORMAL LOW (ref 36.0–46.0)
Hemoglobin: 9.5 g/dL — ABNORMAL LOW (ref 12.0–15.0)
MCH: 22.3 pg — ABNORMAL LOW (ref 26.0–34.0)
MCHC: 30.2 g/dL (ref 30.0–36.0)
MCV: 73.9 fL — ABNORMAL LOW (ref 80.0–100.0)
Platelets: 280 10*3/uL (ref 150–400)
RBC: 4.26 MIL/uL (ref 3.87–5.11)
RDW: 16.4 % — ABNORMAL HIGH (ref 11.5–15.5)
WBC: 8.1 10*3/uL (ref 4.0–10.5)
nRBC: 0 % (ref 0.0–0.2)

## 2023-09-17 LAB — URINALYSIS, ROUTINE W REFLEX MICROSCOPIC
Bilirubin Urine: NEGATIVE
Glucose, UA: NEGATIVE mg/dL
Hgb urine dipstick: NEGATIVE
Ketones, ur: NEGATIVE mg/dL
Leukocytes,Ua: NEGATIVE
Nitrite: NEGATIVE
Protein, ur: NEGATIVE mg/dL
Specific Gravity, Urine: 1.019 (ref 1.005–1.030)
pH: 8 (ref 5.0–8.0)

## 2023-09-17 LAB — OB RESULTS CONSOLE ABO/RH: RH Type: POSITIVE

## 2023-09-17 LAB — HCG, QUANTITATIVE, PREGNANCY: hCG, Beta Chain, Quant, S: 566 m[IU]/mL — ABNORMAL HIGH (ref <5)

## 2023-09-17 LAB — COMPREHENSIVE METABOLIC PANEL WITH GFR
ALT: 16 U/L (ref 0–44)
AST: 19 U/L (ref 15–41)
Albumin: 3.6 g/dL (ref 3.5–5.0)
Alkaline Phosphatase: 80 U/L (ref 38–126)
Anion gap: 8 (ref 5–15)
BUN: 12 mg/dL (ref 6–20)
CO2: 24 mmol/L (ref 22–32)
Calcium: 8.6 mg/dL — ABNORMAL LOW (ref 8.9–10.3)
Chloride: 104 mmol/L (ref 98–111)
Creatinine, Ser: 0.85 mg/dL (ref 0.44–1.00)
GFR, Estimated: >60 mL/min (ref >60)
Glucose, Bld: 93 mg/dL (ref 70–99)
Potassium: 4 mmol/L (ref 3.5–5.1)
Sodium: 136 mmol/L (ref 135–145)
Total Bilirubin: <0.2 mg/dL (ref 0.0–1.2)
Total Protein: 7 g/dL (ref 6.5–8.1)

## 2023-09-17 LAB — ABO/RH: ABO/RH(D): O POS

## 2023-09-17 LAB — WET PREP, GENITAL
Clue Cells Wet Prep HPF POC: NONE SEEN
Sperm: NONE SEEN
Trich, Wet Prep: NONE SEEN
WBC, Wet Prep HPF POC: <10 (ref <10)
Yeast Wet Prep HPF POC: NONE SEEN

## 2023-09-17 LAB — POCT PREGNANCY, URINE: Preg Test, Ur: POSITIVE — AB

## 2023-09-17 LAB — OB RESULTS CONSOLE RPR: RPR: NONREACTIVE

## 2023-09-17 LAB — OB RESULTS CONSOLE VARICELLA ZOSTER ANTIBODY, IGG: Varicella: IMMUNE

## 2023-09-17 MED ORDER — NIFEDIPINE ER OSMOTIC RELEASE 30 MG PO TB24: 30.0000 mg | ORAL_TABLET | Freq: Every day | ORAL | Status: DC

## 2023-09-17 MED ORDER — NIFEDIPINE ER 30 MG PO TB24: 30.0000 mg | ORAL_TABLET | Freq: Every day | ORAL | 0 refills | Status: DC

## 2023-09-17 MED ORDER — PRENATAL PLUS 27-1 MG PO TABS: 1.0000 | ORAL_TABLET | Freq: Every day | ORAL | 6 refills | Status: AC

## 2023-09-17 NOTE — MAU Note (Cosign Needed)
 LLQ Pain    S [name] is a 43 y.o. [id number] pregnant female with LMP 5/1, +HPT 6/9 who presents to MAU today with complaint of LLQ pain. Pt  states started having LLQ pain 6/8 that she describes as cramping, intermittently.  Denies VB.  Upreg + here today in triage.    Pertinent items noted in HPI and remainder of comprehensive ROS otherwise negative. Of note pt states was recently started on antihypertensive medication but couldn't tolerate it and vomited it up. She showed me a pharmacy text message showing Lisinopril-hydrochlorothiazide  combo pill.  She states she was started on this for [facility]  Pt asymptomatic and normotensive today.    O BP (!) 140/72 (BP Location: Left Arm)   Pulse 72   Temp 99.1 F (37.3 C) (Oral)   Resp 16   Ht 4' 11 (1.499 m)   Wt 58.1 kg   LMP 08/07/2023   SpO2 100%   BMI 25.85 kg/m  Physical Exam Vitals and nursing note reviewed.  Constitutional:      General: She is not in acute distress.    Appearance: She is well-developed and normal weight. She is not ill-appearing.  HENT:     Head: Normocephalic and atraumatic.     Mouth/Throat:     Mouth: Mucous membranes are moist.     Pharynx: Oropharynx is clear.  Eyes:     Extraocular Movements: Extraocular movements intact.  Cardiovascular:     Rate and Rhythm: Normal rate.  Pulmonary:     Effort: Pulmonary effort is normal. No respiratory distress.  Abdominal:     General: Abdomen is flat. There is no distension.     Palpations: Abdomen is soft.     Tenderness: There is no abdominal tenderness.  Skin:    General: Skin is warm and dry.  Neurological:     Mental Status: She is alert and oriented to person, place, and time.     Motor: No weakness.  Psychiatric:        Mood and Affect: Mood normal.        Behavior: Behavior normal.      MDM: MAU Course:  [place] w/u: CBC no leukocytosis, Hgb 9.5  ABO O+ hCG 566 Wet prep negative GC collected    CMP for cHTN hx = reassuring baseline, Plts 280  UA noninfectious, pan negative   US  = no IUP seen  AP #[redacted] weeks gestation  #Pregnancy of unknown location - ddx includes early pregnancy, failed pregnancy or ectopic pregnancy - hCG check in 48hrs at [facility], 6/13 0830  - Viability US  in 2 weeks at [facility], 6/25 0830 - pt can transfer any of this care to [facility]  #Chronic HTN - asymptomatic, instructed to keep BP diary and if Bps over 140s/90s to start new antihypertensive (Procardia 30mg  ER every day) safe in pregnancy, recommended BP check in 2 days as well.  #Anemia, pregnancy - pt states already on PO Iron supplementation  Discharge from MAU in stable condition with strict/usual precautions Follow up at desired OBGYN as scheduled for ongoing prenatal care  Allergies as of 09/17/2023   No Known Allergies      Medication List     STOP taking these medications    hydrochlorothiazide  12.5 MG tablet Commonly known as: HYDRODIURIL    norethindrone  0.35 MG tablet Commonly known as: MICRONOR        TAKE these medications    cyclobenzaprine  10 MG tablet Commonly known as:  FLEXERIL  Take 0.5 tablets (5 mg total) by mouth at bedtime.   docusate sodium  100 MG capsule Commonly known as: COLACE Take 1 capsule (100 mg total) by mouth 2 (two) times daily as needed for mild constipation or moderate constipation.   NIFEdipine 30 MG 24 hr tablet Commonly known as: ADALAT CC Take 1 tablet (30 mg total) by mouth daily.   polyethylene glycol powder 17 GM/SCOOP powder Commonly known as: GLYCOLAX /MIRALAX  Take 17 g by mouth daily as needed.        Ebony Goldstein, MD 09/17/2023 6:30 PM

## 2023-09-17 NOTE — MAU Note (Signed)
 Gabrielle Park is a 43 y.o. at Unknown here in MAU reporting: +HPT 6/9. Started having pain in LLQ on Sunday afternoon. Cramping, comes and goes. No bleeding.  LMP: 5/1 Onset of complaint: 6/8 Pain score: moderate Vitals:   09/17/23 1544  BP: 130/83  Pulse: 71  Resp: 16  Temp: 99.1 F (37.3 C)  SpO2: 100%      Lab orders placed from triage:  UA, UPT, vag swabs

## 2023-09-17 NOTE — Discharge Instructions (Signed)

## 2023-09-18 ENCOUNTER — Ambulatory Visit: Payer: Self-pay | Admitting: Family Medicine

## 2023-09-18 LAB — GC/CHLAMYDIA PROBE AMP (~~LOC~~) NOT AT ARMC
Chlamydia: NEGATIVE
Comment: NEGATIVE
Comment: NORMAL
Neisseria Gonorrhea: NEGATIVE

## 2023-09-19 ENCOUNTER — Other Ambulatory Visit: Payer: Self-pay

## 2023-09-19 ENCOUNTER — Ambulatory Visit: Payer: Self-pay | Admitting: *Deleted

## 2023-09-19 VITALS — BP 134/75 | HR 57 | Ht 59.5 in | Wt 127.2 lb

## 2023-09-19 DIAGNOSIS — Z3A00 Weeks of gestation of pregnancy not specified: Secondary | ICD-10-CM

## 2023-09-19 DIAGNOSIS — O3680X0 Pregnancy with inconclusive fetal viability, not applicable or unspecified: Secondary | ICD-10-CM

## 2023-09-19 LAB — BETA HCG QUANT (REF LAB): hCG Quant: 993 m[IU]/mL

## 2023-09-19 NOTE — Progress Notes (Signed)
 Here for stat bhcg. Denies pain or bleeding. Reports she has not taken her Procardia  today, but took it for first time yesterday. She reports no issues so far with it. BP 144/76. Blood drawn and explained I will call her with results and plan of care in a few hours. Alejandra Hurst 1:30 results received and reviewed with Dr. Ilona Malta  who advised appropriate rise and advises US  in 14 days. I called patient with [facility] Constantino Demark [phone] and reviewed results and recommendation. She agreed to US  10/06/23 and we canceled previously scheduled US  for 10/01/23. She voices understanding.  Alejandra Hurst

## 2023-10-01 ENCOUNTER — Other Ambulatory Visit: Payer: Self-pay

## 2023-10-06 ENCOUNTER — Ambulatory Visit: Payer: Self-pay

## 2023-10-06 ENCOUNTER — Other Ambulatory Visit: Payer: Self-pay

## 2023-10-06 DIAGNOSIS — O3680X0 Pregnancy with inconclusive fetal viability, not applicable or unspecified: Secondary | ICD-10-CM

## 2023-10-06 DIAGNOSIS — Z3491 Encounter for supervision of normal pregnancy, unspecified, first trimester: Secondary | ICD-10-CM

## 2023-10-06 DIAGNOSIS — Z3A01 Less than 8 weeks gestation of pregnancy: Secondary | ICD-10-CM

## 2023-10-08 ENCOUNTER — Telehealth: Payer: Self-pay | Admitting: Family Medicine

## 2023-10-08 NOTE — Telephone Encounter (Signed)
 Received a call from her friend requesting the results of an ultrasound that was done on yesterday. If everything is good she would like to make an appointment.

## 2023-10-09 NOTE — Telephone Encounter (Signed)
 Per chart review, US  result from 6/30 has not yet been reviewed by provider. Once results have been reviewed, pt will be contacted for next steps in care.

## 2023-10-15 ENCOUNTER — Telehealth: Payer: Self-pay | Admitting: *Deleted

## 2023-10-15 NOTE — Telephone Encounter (Signed)
 Received a voice message from a female who is calling for Kenitha. She states she left a message previously because patient has not heard back re: US  results and would like a call with results.  Rock Skip PEAK

## 2023-10-15 NOTE — Telephone Encounter (Signed)
 I called patient with Interpreter Eda Royal and informed her US  results from 10/06/23 showing live baby with FHR with EDD 05/21/24. I advised she start prenatal care with provider her choice and PNV. She voices understanding. Rock Skip PEAK

## 2023-10-15 NOTE — Telephone Encounter (Signed)
 See other telephone noted dated 10/15/23. Rock Skip PEAK

## 2023-10-30 LAB — OB RESULTS CONSOLE GC/CHLAMYDIA
Chlamydia: NEGATIVE
Neisseria Gonorrhea: NEGATIVE

## 2023-10-30 LAB — OB RESULTS CONSOLE ANTIBODY SCREEN: Antibody Screen: NEGATIVE

## 2023-10-30 LAB — HEPATITIS C ANTIBODY: HCV Ab: NEGATIVE

## 2023-10-30 LAB — OB RESULTS CONSOLE HGB/HCT, BLOOD
HCT: 36 (ref 29–41)
Hemoglobin: 11.6

## 2023-10-30 LAB — GLUCOSE TOLERANCE, 1 HOUR
Glucose Tolerance, 1 hour: 145
Glucose, 1 Hour GTT: 145

## 2023-10-30 LAB — HBV PRENATAL SCREEN
Hep B Core Total Ab: NONREACTIVE
Hep B S Ab: REACTIVE
Hepatitis B Surface Ag: NONREACTIVE

## 2023-10-30 LAB — OB RESULTS CONSOLE HIV ANTIBODY (ROUTINE TESTING): HIV: NONREACTIVE

## 2023-10-30 LAB — OB RESULTS CONSOLE RUBELLA ANTIBODY, IGM: Rubella: IMMUNE

## 2023-10-30 LAB — OB RESULTS CONSOLE RPR: RPR: NONREACTIVE

## 2023-10-30 LAB — CYTOLOGY - PAP: Pap: NEGATIVE

## 2023-10-30 LAB — OB RESULTS CONSOLE PLATELET COUNT: Platelets: 215

## 2023-11-07 LAB — GLUCOSE TOLERANCE, 3 HOURS
Glucose Tolerance, 1 hour: 147
Glucose Tolerance, 2 hour: 145
Glucose Tolerance, 3 Hour: 124
Glucose Tolerance, Fasting: 97 (ref 70–99)

## 2023-11-10 ENCOUNTER — Other Ambulatory Visit: Payer: Self-pay | Admitting: Nurse Practitioner

## 2023-11-10 DIAGNOSIS — Z36 Encounter for antenatal screening for chromosomal anomalies: Secondary | ICD-10-CM

## 2023-11-27 ENCOUNTER — Other Ambulatory Visit: Payer: Self-pay

## 2023-11-27 ENCOUNTER — Ambulatory Visit: Payer: Self-pay | Admitting: Obstetrics and Gynecology

## 2023-11-27 ENCOUNTER — Encounter: Payer: Self-pay | Admitting: Obstetrics and Gynecology

## 2023-11-27 ENCOUNTER — Encounter: Payer: Self-pay | Admitting: General Practice

## 2023-11-27 VITALS — BP 116/71 | HR 78 | Wt 132.0 lb

## 2023-11-27 DIAGNOSIS — O09299 Supervision of pregnancy with other poor reproductive or obstetric history, unspecified trimester: Secondary | ICD-10-CM | POA: Insufficient documentation

## 2023-11-27 DIAGNOSIS — Z3A14 14 weeks gestation of pregnancy: Secondary | ICD-10-CM

## 2023-11-27 DIAGNOSIS — Z758 Other problems related to medical facilities and other health care: Secondary | ICD-10-CM

## 2023-11-27 DIAGNOSIS — O0992 Supervision of high risk pregnancy, unspecified, second trimester: Secondary | ICD-10-CM

## 2023-11-27 DIAGNOSIS — Z603 Acculturation difficulty: Secondary | ICD-10-CM

## 2023-11-27 DIAGNOSIS — O09529 Supervision of elderly multigravida, unspecified trimester: Secondary | ICD-10-CM | POA: Insufficient documentation

## 2023-11-27 DIAGNOSIS — O10912 Unspecified pre-existing hypertension complicating pregnancy, second trimester: Secondary | ICD-10-CM

## 2023-11-27 DIAGNOSIS — O09522 Supervision of elderly multigravida, second trimester: Secondary | ICD-10-CM

## 2023-11-27 DIAGNOSIS — O09292 Supervision of pregnancy with other poor reproductive or obstetric history, second trimester: Secondary | ICD-10-CM | POA: Diagnosis not present

## 2023-11-27 DIAGNOSIS — O0990 Supervision of high risk pregnancy, unspecified, unspecified trimester: Secondary | ICD-10-CM

## 2023-11-27 DIAGNOSIS — O10919 Unspecified pre-existing hypertension complicating pregnancy, unspecified trimester: Secondary | ICD-10-CM

## 2023-11-27 MED ORDER — ASPIRIN 81 MG PO TBEC: 162.0000 mg | DELAYED_RELEASE_TABLET | Freq: Every day | ORAL | 12 refills | Status: AC

## 2023-11-27 NOTE — Progress Notes (Signed)
 History:  Gabrielle Park is a 43 y.o. H3E5985 at [redacted]w[redacted]d by early ultrasound being seen today for her first obstetrical visit.   Patient does not intend to breast feed.   Pregnancy history fully reviewed. Obstetrical history is significant for 4 SVD.   Patient reports no complaints.  HISTORY: OB History  Gravida Para Term Preterm AB Living  6 4 4  0 1 4  SAB IAB Ectopic Multiple Live Births  1 0 0 0 4    # Outcome Date GA Lbr Len/2nd Weight Sex Type Anes PTL Lv  6 Current           5 SAB 05/2023          4 Term 03/05/16 [redacted]w[redacted]d 00:57 / 00:01 5 lb 6.2 oz (2.444 kg) M Vag-Spont None  LIV     Birth Comments: wnl     Name: CASTRO-CASTRO,BOY Gretchen     Apgar1: 6  Apgar5: 8  3 Term 06/24/09 [redacted]w[redacted]d  5 lb 8 oz (2.495 kg) M Vag-Spont EPI N LIV  2 Term 10/01/04 [redacted]w[redacted]d  6 lb 6 oz (2.892 kg) F Vag-Spont EPI N LIV  1 Term 10/09/01 [redacted]w[redacted]d  7 lb 2 oz (3.232 kg) F Vag-Spont EPI N LIV     Last pap smear was done at [facility] recently.  Lab Results  Component Value Date   DIAGPAP  10/21/2019    - Negative for intraepithelial lesion or malignancy (NILM)   DIAGPAP  03/23/2019    - Negative for intraepithelial lesion or malignancy (NILM)   HPVHIGH Negative 03/23/2019     Past Medical History:  Diagnosis Date   Abnormal cervical Papanicolaou smear    Anemia    History of severe pre-eclampsia 03/05/2016   Hypertension    Past Surgical History:  Procedure Laterality Date   DILATION AND EVACUATION N/A 06/05/2023   Procedure: DILATATION AND EVACUATION;  Surgeon: Herchel Gloris LABOR, MD;  Location: [facility] OR;  Service: Gynecology;  Laterality: N/A;   OPERATIVE ULTRASOUND N/A 06/05/2023   Procedure: OPERATIVE ULTRASOUND;  Surgeon: Herchel Gloris LABOR, MD;  Location: [facility] OR;  Service: Gynecology;  Laterality: N/A;   Family History  Problem Relation Age of Onset   Hypertension Mother    Diabetes Mother    Healthy Father    Diabetes Maternal Aunt    Social History   Tobacco Use   Smoking status:  Never   Smokeless tobacco: Never  Vaping Use   Vaping status: Never Used  Substance Use Topics   Alcohol use: Not Currently    Comment: socially   Drug use: No   No Known Allergies Current Outpatient Medications on File Prior to Visit  Medication Sig Dispense Refill   FEROSUL 325 (65 Fe) MG tablet Take 325 mg by mouth 2 (two) times daily.     NIFEdipine  (ADALAT  CC) 30 MG 24 hr tablet Take 1 tablet (30 mg total) by mouth daily. 30 tablet 0   prenatal vitamin w/FE, FA (PRENATAL 1 + 1) 27-1 MG TABS tablet Take 1 tablet by mouth daily at 12 noon. 30 tablet 6   No current facility-administered medications on file prior to visit.    Review of Systems Pertinent items noted in HPI and remainder of comprehensive ROS otherwise negative.  Physical Exam:   Vitals:   11/27/23 1030  BP: 116/71  Pulse: 78  Weight: 132 lb (59.9 kg)   Fetal Heart Rate (bpm): 157  General: well-developed, well-nourished female in no acute distress  Skin:  normal coloration and turgor, no rashes  Neurologic: oriented, normal, negative, normal mood  Extremities: normal strength, tone, and muscle mass, ROM of all joints is normal  HEENT PERRLA, extraocular movement intact and sclera clear, anicteric  Neck supple and no masses   Assessment:    Pregnancy: [id number] Patient Active Problem List   Diagnosis Date Noted   Hx of intrauterine growth restriction in prior pregnancy, currently pregnant 11/27/2023   Hx of preeclampsia, prior pregnancy, currently pregnant 11/27/2023   Advanced maternal age in multigravida 11/27/2023   Chronic hypertension affecting pregnancy 06/12/2023   Language barrier 02/01/2016   Supervision of high risk pregnancy, antepartum 09/21/2015     Plan:    1. Supervision of high risk pregnancy, antepartum (Primary) Initial labs done at [facility]. Pap done there as well.  Continue prenatal vitamins. Problem list reviewed and updated. Genetic Screening discussed, NIPS - boy!  Ultrasound  discussed; fetal anatomic survey: scheduled. Anticipatory guidance about prenatal visits given including labs, ultrasounds, and testing.  2. Pregnancy with 14 completed weeks gestation   3. Hx of intrauterine growth restriction in prior pregnancy, currently pregnant Will do serial US .  Discussed benefits of ldASA for this as well.   4. Hx of preeclampsia, prior pregnancy, currently pregnant Discussed 162 mg ASA.   5. Chronic hypertension affecting pregnancy Procardia  daily Baseline CMP wnl.  Check P/C ratio.   6. Multigravida of advanced maternal age in second trimester Discussed delivery no later than 39w depending on FGR, etc.   7. Language barrier Interpreter used throughout.   The nature of [facility]ulty Practice with multiple MDs and Advanced Practice Providers was explained to patient; also emphasized that residents, students are part of our team. Routine obstetric precautions reviewed. Encouraged to seek out care at office or emergency room [facility] MAU preferred) for urgent and/or emergent concerns. Return in about 4 weeks (around 12/25/2023).    Vina Solian, MD, FACOG Obstetrician & Gynecologist, [facility], [facility]

## 2023-11-29 LAB — PROTEIN / CREATININE RATIO, URINE
Creatinine, Urine: 64.6 mg/dL
Protein, Ur: 5.1 mg/dL
Protein/Creat Ratio: 79 mg/g{creat} (ref 0–200)

## 2023-12-22 ENCOUNTER — Other Ambulatory Visit: Payer: Self-pay

## 2023-12-29 ENCOUNTER — Ambulatory Visit (INDEPENDENT_AMBULATORY_CARE_PROVIDER_SITE_OTHER): Admitting: Obstetrics and Gynecology

## 2023-12-29 ENCOUNTER — Other Ambulatory Visit: Payer: Self-pay

## 2023-12-29 VITALS — BP 127/85 | HR 76 | Wt 138.0 lb

## 2023-12-29 DIAGNOSIS — O09299 Supervision of pregnancy with other poor reproductive or obstetric history, unspecified trimester: Secondary | ICD-10-CM

## 2023-12-29 DIAGNOSIS — O0990 Supervision of high risk pregnancy, unspecified, unspecified trimester: Secondary | ICD-10-CM

## 2023-12-29 DIAGNOSIS — O0992 Supervision of high risk pregnancy, unspecified, second trimester: Secondary | ICD-10-CM

## 2023-12-29 DIAGNOSIS — O09522 Supervision of elderly multigravida, second trimester: Secondary | ICD-10-CM

## 2023-12-29 DIAGNOSIS — Z603 Acculturation difficulty: Secondary | ICD-10-CM | POA: Diagnosis not present

## 2023-12-29 DIAGNOSIS — O10912 Unspecified pre-existing hypertension complicating pregnancy, second trimester: Secondary | ICD-10-CM | POA: Diagnosis not present

## 2023-12-29 DIAGNOSIS — Z3A19 19 weeks gestation of pregnancy: Secondary | ICD-10-CM

## 2023-12-29 DIAGNOSIS — O10919 Unspecified pre-existing hypertension complicating pregnancy, unspecified trimester: Secondary | ICD-10-CM

## 2023-12-29 DIAGNOSIS — Z758 Other problems related to medical facilities and other health care: Secondary | ICD-10-CM

## 2023-12-29 DIAGNOSIS — O09292 Supervision of pregnancy with other poor reproductive or obstetric history, second trimester: Secondary | ICD-10-CM

## 2023-12-29 MED ORDER — LABETALOL HCL 200 MG PO TABS: 200.0000 mg | ORAL_TABLET | Freq: Two times a day (BID) | ORAL | 3 refills | Status: AC

## 2023-12-29 NOTE — Progress Notes (Signed)
   PRENATAL VISIT NOTE  Subjective:  Gabrielle Park is a 43 y.o. H3E5985 at [redacted]w[redacted]d being seen today for ongoing prenatal care.  She is currently monitored for the following issues for this high-risk pregnancy and has Supervision of high risk pregnancy, antepartum; Language barrier; Chronic hypertension affecting pregnancy; Hx of intrauterine growth restriction in prior pregnancy, currently pregnant; Hx of preeclampsia, prior pregnancy, currently pregnant; and Advanced maternal age in multigravida on their problem list.  Patient doing well with no acute concerns today. She reports increased side effects on procardia .   . Vag. Bleeding: None.  Movement: Present. Denies leaking of fluid.   The following portions of the patient's history were reviewed and updated as appropriate: allergies, current medications, past family history, past medical history, past social history, past surgical history and problem list. Problem list updated.  Objective:   Vitals:   12/29/23 1604  BP: 127/85  Pulse: 76  Weight: 138 lb (62.6 kg)    Fetal Status: Fetal Heart Rate (bpm): 161   Movement: Present     General:  Alert, oriented and cooperative. Patient is in no acute distress.  Skin: Skin is warm and dry. No rash noted.   Cardiovascular: Normal heart rate noted  Respiratory: Normal respiratory effort, no problems with respiration noted  Abdomen: Soft, gravid, appropriate for gestational age.  Pain/Pressure: Absent     Pelvic: Cervical exam deferred        Extremities: Normal range of motion.  Edema: None  Mental Status:  Normal mood and affect. Normal behavior. Normal judgment and thought content.   Assessment and Plan:  Pregnancy: H3E5985 at [redacted]w[redacted]d  1. Supervision of high risk pregnancy, antepartum (Primary) Continue routine prenatal care - AFP, Serum, Open Spina Bifida  2. [redacted] weeks gestation of pregnancy   3. Chronic hypertension affecting pregnancy Pt is not tolerating procardia .  Procardia  discontinued, will start labetalol  - labetalol  (NORMODYNE ) 200 MG tablet; Take 1 tablet (200 mg total) by mouth 2 (two) times daily.  Dispense: 60 tablet; Refill: 3  4. Language barrier Live interpreter present  5. Hx of preeclampsia, prior pregnancy, currently pregnant Pt compliant with baby ASA  6. Hx of intrauterine growth restriction in prior pregnancy, currently pregnant Pt has ultrasound on 01/02/24 for anatomy/growth  7. Multigravida of advanced maternal age in second trimester   Preterm labor symptoms and general obstetric precautions including but not limited to vaginal bleeding, contractions, leaking of fluid and fetal movement were reviewed in detail with the patient.  Please refer to After Visit Summary for other counseling recommendations.   Return in about 4 weeks (around 01/26/2024) for ROB, in person.   Jerilynn Buddle, MD Faculty Attending Center for [facility]

## 2023-12-31 ENCOUNTER — Ambulatory Visit: Payer: Self-pay | Admitting: Obstetrics and Gynecology

## 2023-12-31 DIAGNOSIS — O0990 Supervision of high risk pregnancy, unspecified, unspecified trimester: Secondary | ICD-10-CM

## 2023-12-31 LAB — AFP, SERUM, OPEN SPINA BIFIDA
AFP MoM: 1.27
AFP Value: 69.1 ng/mL
Gest. Age on Collection Date: 19.3 wk
Maternal Age At EDD: 41.7 a
OSBR Risk 1 IN: 5251
Test Results:: NEGATIVE
Weight: 138 [lb_av]

## 2024-01-02 ENCOUNTER — Ambulatory Visit: Payer: Self-pay | Attending: Obstetrics and Gynecology | Admitting: Obstetrics

## 2024-01-02 ENCOUNTER — Encounter: Payer: Self-pay | Admitting: Obstetrics and Gynecology

## 2024-01-02 ENCOUNTER — Ambulatory Visit: Payer: Self-pay

## 2024-01-02 ENCOUNTER — Encounter: Payer: Self-pay | Admitting: Obstetrics

## 2024-01-02 ENCOUNTER — Other Ambulatory Visit: Payer: Self-pay | Admitting: *Deleted

## 2024-01-02 VITALS — BP 130/78 | HR 56

## 2024-01-02 DIAGNOSIS — O10012 Pre-existing essential hypertension complicating pregnancy, second trimester: Secondary | ICD-10-CM | POA: Diagnosis not present

## 2024-01-02 DIAGNOSIS — O99012 Anemia complicating pregnancy, second trimester: Secondary | ICD-10-CM

## 2024-01-02 DIAGNOSIS — Z7982 Long term (current) use of aspirin: Secondary | ICD-10-CM | POA: Diagnosis not present

## 2024-01-02 DIAGNOSIS — D649 Anemia, unspecified: Secondary | ICD-10-CM

## 2024-01-02 DIAGNOSIS — O09522 Supervision of elderly multigravida, second trimester: Secondary | ICD-10-CM

## 2024-01-02 DIAGNOSIS — Z3A20 20 weeks gestation of pregnancy: Secondary | ICD-10-CM | POA: Diagnosis not present

## 2024-01-02 DIAGNOSIS — O09292 Supervision of pregnancy with other poor reproductive or obstetric history, second trimester: Secondary | ICD-10-CM

## 2024-01-02 DIAGNOSIS — O283 Abnormal ultrasonic finding on antenatal screening of mother: Secondary | ICD-10-CM | POA: Diagnosis not present

## 2024-01-02 DIAGNOSIS — Z79899 Other long term (current) drug therapy: Secondary | ICD-10-CM | POA: Diagnosis not present

## 2024-01-02 DIAGNOSIS — Z363 Encounter for antenatal screening for malformations: Secondary | ICD-10-CM | POA: Diagnosis not present

## 2024-01-02 DIAGNOSIS — O10912 Unspecified pre-existing hypertension complicating pregnancy, second trimester: Secondary | ICD-10-CM

## 2024-01-02 DIAGNOSIS — O09299 Supervision of pregnancy with other poor reproductive or obstetric history, unspecified trimester: Secondary | ICD-10-CM

## 2024-01-02 DIAGNOSIS — O10919 Unspecified pre-existing hypertension complicating pregnancy, unspecified trimester: Secondary | ICD-10-CM

## 2024-01-02 DIAGNOSIS — O358XX0 Maternal care for other (suspected) fetal abnormality and damage, not applicable or unspecified: Secondary | ICD-10-CM

## 2024-01-02 DIAGNOSIS — Z36 Encounter for antenatal screening for chromosomal anomalies: Secondary | ICD-10-CM

## 2024-01-02 NOTE — Progress Notes (Signed)
 MFM Consult Note  Gabrielle Park is currently at 20 weeks and 0 days.  She was seen due to advanced maternal age (43 years old) and chronic hypertension currently treated with labetalol .  She has a history of IUGR in her prior pregnancies.    She had a first trimester miscarriage earlier this year.  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  She denies any problems in her current pregnancy.    She had a cell free DNA test earlier in her current pregnancy which indicated a low risk for trisomy 29, 61, and 13. A female fetus is predicted.   Sonographic findings Single intrauterine pregnancy at 20w 0d  Fetal cardiac activity:  Observed and appears normal. Presentation: Variable. The anatomic structures that were well seen appear normal.  An echogenic focus was noted in the left ventricle of the fetal heart.  Due to poor acoustic windows some structures remain suboptimally visualized. Fetal biometry shows the estimated fetal weight at the 32nd percentile.  Amniotic fluid: Within normal limits.  MVP: 6.84 cm. Placenta: Posterior Fundal. Adnexa: No abnormality visualized. Cervical length: 3.8 cm.  On today's exam, an intracardiac echogenic focus was noted in the left ventricle of the fetal heart.    The small association between an echogenic focus and Down syndrome was discussed.   Due to the echogenic focus noted today, the patient was offered and declined an amniocentesis today for definitive diagnosis of fetal aneuploidy.  She reports that she is comfortable with her negative cell free DNA test.  The patient was informed that anomalies may be missed due to technical limitations. If the fetus is in a suboptimal position or maternal habitus is increased, visualization of the fetus in the maternal uterus may be impaired.  Chronic hypertension with history of IUGR  She was advised to continue taking labetalol  for the duration of her pregnancy for treatment of  her elevated blood pressures. The increased risk of superimposed preeclampsia due to chronic hypertension was discussed.   She should continue taking a daily baby aspirin  for preeclampsia prophylaxis.   We will continue to follow her with growth ultrasounds throughout her pregnancy.   Weekly fetal testing should be started at 32 weeks.   Due to her age and elevated blood pressures, delivery may be considered at around 38 weeks.   She will return in 5 weeks for another ultrasound exam.    The patient stated that all of her questions were answered today.    All conversations were held with the patient today with the help of a Spanish interpreter.    Due to her history of a prior fetus with Turner syndrome, the patient is meeting with our genetic counselor following today's ultrasound exam.  A total of 45 minutes was spent counseling and coordinating the care for this patient.  Greater than 50% of the time was spent in direct face-to-face contact.

## 2024-01-02 NOTE — Progress Notes (Signed)
 [facility] at [facility] [address] Phone:  [phone]   Fax:  [phone]      In-Person Genetic Counseling Clinic Note:   I spoke with 43 y.o. Gabrielle Park today to discuss her age related pregnancy risks. She was referred by Johnetta Knee, NP. She was accompanied by FOB Uriel. An in-person Spanish interpreter was present.   Pregnancy History:    [id number]. EGA: [redacted]w[redacted]d by US . EDD: 05/21/2024. She has four healthy children. She had one SAB at [redacted]w[redacted]d that was confirmed to have monosomy X on [facility] product of conception testing. Personal history of chronic HTN. Denies other major personal health concerns. Denies bleeding, infections, and fevers in this pregnancy. Denies using tobacco, alcohol, or street drugs in this pregnancy.   Family History:    A three-generation pedigree was created and scanned into Epic under the Media tab.  Patient's most recent pregnancy (05/2023) resulted in a SAB. Panorama NIPS indicated high risk for Turner syndrome. 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 Please see report for details. We reviewed that Turner syndrome most commonly occurs by chance due to an error in chromosomal division during the formation of egg and sperm cells in a process called nondisjunction. Therefore, recurrence risk is low. Moreover, it is not associated with advanced maternal age.  FOB reports his mother had a stillborn daughter. No additional information is known which limits risk assessment.  Patient ethnicity reported as Hispanic and FOB ethnicity reported as Hispanic. Denies Ashkenazi Jewish ancestry.  Family history not remarkable for consanguinity, individuals with birth defects, intellectual disability, autism spectrum disorder, multiple spontaneous abortions, still births, or unexplained neonatal death.   Advanced Maternal  Age:  We briefly discussed that the chance that a fetus would be affected with a chromosome difference increases with advanced maternal age. [name]'s current age-related risk to have a pregnancy affected with a chromosome difference is approximately 1 in 49 (~2%). We discussed that this risk may also be lower given her low-risk NIPS results.  We discussed and offered the option of amniocentesis. The technical aspects, benefits, risks, and limitations were reviewed with the patient including the 1 in 500 risk for miscarriage. We also discussed that testing can be performed postnatally if there is concern for a chromosomal condition in the newborn. Kendel declined amniocentesis.   Previous Testing Completed:  Low risk NIPS: Alaysha previously completed Panorama noninvasive prenatal screening (NIPS) in this pregnancy. The result is low risk, consistent with a female fetus. This screening significantly reduces but does not eliminate the chance that the current pregnancy has Down syndrome (trisomy 55), trisomy 53, trisomy 57, common sex chromosome conditions, and 22q11.2 microdeletion syndrome. Please see report for details. There are many genetic conditions that cannot be detected by NIPS.   Negative carrier screening: [name] previously completed [employer] carrier screening. She screened to not be a carrier for cystic fibrosis (CF), spinal muscular atrophy (SMA), alpha thalassemia, and beta hemoglobinopathies. Please see report for details. A negative result on carrier screening reduces but does not eliminate the chance of being a carrier.    Plan of Care:   Declined amniocentesis. Routine prenatal care. MFC follow-up ultrasound on 02/09/2024.   Informed consent was obtained. All questions were answered.   35 minutes were spent on the date of the encounter in service to the patient including preparation, face-to-face consultation, discussion of test reports and  available next steps, pedigree  Holiday representative, genetic risk assessment, documentation, and care coordination.    Thank you for sharing in the care of Gabrielle Park with us .  Please do not hesitate to contact us  at [phone] if you have any questions.   Lauraine Bodily, MS, [facility] Certified Genetic Counselor   Genetic counseling student involved in appointment: No.

## 2024-01-06 ENCOUNTER — Ambulatory Visit

## 2024-01-07 ENCOUNTER — Telehealth: Payer: Self-pay | Admitting: Family Medicine

## 2024-01-07 NOTE — Telephone Encounter (Signed)
 Called patient to inform her that she missed her visit for a bp check yesterday 09/30 and we need to reschedule. She didn't answer the call so I left her a voice message asking her to give us  a call back to reschedule.

## 2024-01-14 ENCOUNTER — Telehealth: Payer: Self-pay | Admitting: *Deleted

## 2024-01-14 NOTE — Telephone Encounter (Signed)
 Requested Spanish Interpreter [employer] , also [facility] faciltator to call patient to offer [facility] prenatal care. Patient requested to be enrolled. Rock Skip PEAK

## 2024-01-21 ENCOUNTER — Ambulatory Visit: Admitting: Family Medicine

## 2024-01-21 VITALS — BP 144/89 | HR 61 | Wt 141.8 lb

## 2024-01-21 DIAGNOSIS — Z3A22 22 weeks gestation of pregnancy: Secondary | ICD-10-CM

## 2024-01-21 DIAGNOSIS — O0992 Supervision of high risk pregnancy, unspecified, second trimester: Secondary | ICD-10-CM

## 2024-01-21 DIAGNOSIS — O09522 Supervision of elderly multigravida, second trimester: Secondary | ICD-10-CM | POA: Diagnosis not present

## 2024-01-21 DIAGNOSIS — O10919 Unspecified pre-existing hypertension complicating pregnancy, unspecified trimester: Secondary | ICD-10-CM

## 2024-01-21 DIAGNOSIS — O09299 Supervision of pregnancy with other poor reproductive or obstetric history, unspecified trimester: Secondary | ICD-10-CM

## 2024-01-21 DIAGNOSIS — O09292 Supervision of pregnancy with other poor reproductive or obstetric history, second trimester: Secondary | ICD-10-CM | POA: Diagnosis not present

## 2024-01-21 DIAGNOSIS — Z23 Encounter for immunization: Secondary | ICD-10-CM

## 2024-01-21 DIAGNOSIS — O10912 Unspecified pre-existing hypertension complicating pregnancy, second trimester: Secondary | ICD-10-CM

## 2024-01-21 DIAGNOSIS — O0990 Supervision of high risk pregnancy, unspecified, unspecified trimester: Secondary | ICD-10-CM

## 2024-01-21 NOTE — Progress Notes (Signed)
     PRENATAL VISIT NOTE  Subjective:  Gabrielle Park is a 43 y.o. H3E5985 at [redacted]w[redacted]d being seen today for ongoing prenatal care.  She is currently monitored for the following issues for this high-risk pregnancy and has Supervision of high risk pregnancy, antepartum; Language barrier; Chronic hypertension affecting pregnancy; Hx of intrauterine growth restriction in prior pregnancy, currently pregnant; Hx of preeclampsia, prior pregnancy, currently pregnant; and Advanced maternal age in multigravida on their problem list.  Patient reports no complaints.  Contractions: Irritability. Vag. Bleeding: None.  Movement: Present. Denies leaking of fluid.   The following portions of the patient's history were reviewed and updated as appropriate: allergies, current medications, past family history, past medical history, past social history, past surgical history and problem list.   Objective:    Vitals:   01/21/24 0947 01/21/24 0951  BP: (!) 145/80 (!) 144/89  Pulse: 61   Weight: 141 lb 12.8 oz (64.3 kg)     Fetal Status:  Fetal Heart Rate (bpm): 155 Fundal Height: 23 cm Movement: Present    General: Alert, oriented and cooperative. Patient is in no acute distress.  Skin: Skin is warm and dry. No rash noted.   Cardiovascular: Normal heart rate noted  Respiratory: Normal respiratory effort, no problems with respiration noted  Abdomen: Soft, gravid, appropriate for gestational age.  Pain/Pressure: Present     Pelvic: Cervical exam deferred        Extremities: Normal range of motion.     Mental Status: Normal mood and affect. Normal behavior. Normal judgment and thought content.   Assessment and Plan:  Pregnancy: H3E5985 at [redacted]w[redacted]d 1. Hx of intrauterine growth restriction in prior pregnancy, currently pregnant (Primary) Growth US  regularly  2. Chronic hypertension affecting pregnancy BP slightly elevated, mild range today. On Labetalol  Taking ASA Q 4 week growth US  Will need weekly  BPPs at 32 weeks  3. Hx of preeclampsia, prior pregnancy, currently pregnant Baseline labs needed-- will get with 28 week labs  4. Multigravida of advanced maternal age in second trimester ASA  5. Supervision of high risk pregnancy, antepartum  Centering Pregnancy, Session#3: Reviewed resources in [employer].   Facilitated discussion today:  Stress/Stress reduction and breastfeeding/infant nutrition Mindfulness activity completed as well as deep breathing with still touch for childbirth preparation.    Fundal height and FHR appropriate today unless noted otherwise in plan. Patient to continue group care.    6. [redacted] weeks gestation of pregnancy  7. Needs flu shot - Flu vaccine trivalent PF, 6mos and older(Flulaval,Afluria,Fluarix,Fluzone)  Preterm labor symptoms and general obstetric precautions including but not limited to vaginal bleeding, contractions, leaking of fluid and fetal movement were reviewed in detail with the patient. Please refer to After Visit Summary for other counseling recommendations.   Return in about 2 weeks (around 02/04/2024) for Routine prenatal care, Centering Pregnancy.  Future Appointments  Date Time Provider Department Center  02/04/2024  9:00 AM CENTERING PROVIDER [facility]  02/09/2024  9:00 AM [facility] PROVIDER 1 [facility]  02/09/2024  9:30 AM WMC[facility]MFC US1 [facility]  02/18/2024  9:00 AM CENTERING PROVIDER [facility]  03/02/2024  9:00 AM CENTERING PROVIDER [facility]  03/17/2024  9:00 AM CENTERING PROVIDER [facility]  03/30/2024  9:00 AM CENTERING PROVIDER [facility]  04/14/2024  9:00 AM CENTERING PROVIDER [facility]  04/28/2024  9:00 AM CENTERING PROVIDER [facility]    Suzen Maryan Masters, MD

## 2024-01-29 ENCOUNTER — Encounter: Admitting: Obstetrics & Gynecology

## 2024-02-04 ENCOUNTER — Ambulatory Visit (INDEPENDENT_AMBULATORY_CARE_PROVIDER_SITE_OTHER): Payer: Self-pay | Admitting: Family Medicine

## 2024-02-04 VITALS — BP 131/84 | HR 66 | Wt 144.4 lb

## 2024-02-04 DIAGNOSIS — O10912 Unspecified pre-existing hypertension complicating pregnancy, second trimester: Secondary | ICD-10-CM

## 2024-02-04 DIAGNOSIS — O10919 Unspecified pre-existing hypertension complicating pregnancy, unspecified trimester: Secondary | ICD-10-CM

## 2024-02-04 DIAGNOSIS — O09299 Supervision of pregnancy with other poor reproductive or obstetric history, unspecified trimester: Secondary | ICD-10-CM

## 2024-02-04 DIAGNOSIS — O0992 Supervision of high risk pregnancy, unspecified, second trimester: Secondary | ICD-10-CM

## 2024-02-04 DIAGNOSIS — O0990 Supervision of high risk pregnancy, unspecified, unspecified trimester: Secondary | ICD-10-CM

## 2024-02-04 DIAGNOSIS — O09522 Supervision of elderly multigravida, second trimester: Secondary | ICD-10-CM | POA: Diagnosis not present

## 2024-02-04 DIAGNOSIS — O09292 Supervision of pregnancy with other poor reproductive or obstetric history, second trimester: Secondary | ICD-10-CM

## 2024-02-04 DIAGNOSIS — Z3A24 24 weeks gestation of pregnancy: Secondary | ICD-10-CM

## 2024-02-04 NOTE — Progress Notes (Signed)
 Pt denied having H/A or visual disturbances.  She stated that she has not yet taken morning dose of Labetalol .

## 2024-02-04 NOTE — Progress Notes (Signed)
   PRENATAL VISIT NOTE: Centering Pregnancy Group 3D, Session 4   Subjective:  Gabrielle Park is a 43 y.o. H3E5985 at [redacted]w[redacted]d being seen today for ongoing prenatal care.  She is currently monitored for the following issues for this high-risk pregnancy and has Supervision of high risk pregnancy, antepartum; Language barrier; Chronic hypertension affecting pregnancy; Hx of intrauterine growth restriction in prior pregnancy, currently pregnant; Hx of preeclampsia, prior pregnancy, currently pregnant; and Advanced maternal age in multigravida on their problem list.  Patient reports no complaints.  Contractions: Not present. Vag. Bleeding: None.  Movement: Present. Denies leaking of fluid.   The following portions of the patient's history were reviewed and updated as appropriate: allergies, current medications, past family history, past medical history, past social history, past surgical history and problem list.   Objective:    Vitals:   02/04/24 0929 02/04/24 0940  BP: (!) 153/93 131/84  Pulse: 66   Weight: 144 lb 6.4 oz (65.5 kg)     Fetal Status:  Fetal Heart Rate (bpm): 154 Fundal Height: 24 cm Movement: Present    General: Alert, oriented and cooperative. Patient is in no acute distress.  Skin: Skin is warm and dry. No rash noted.   Cardiovascular: Normal heart rate noted  Respiratory: Normal respiratory effort, no problems with respiration noted  Abdomen: Soft, gravid, appropriate for gestational age.  Pain/Pressure: Absent     Pelvic: Cervical exam deferred        Extremities: Normal range of motion.     Mental Status: Normal mood and affect. Normal behavior. Normal judgment and thought content.   Assessment and Plan:  Pregnancy: H3E5985 at [redacted]w[redacted]d 1. Hx of preeclampsia, prior pregnancy, currently pregnant BP initially elevated and improved on repeat.  Monitor closely  Needs baseline labs - Comprehensive metabolic panel with GFR - Protein / creatinine ratio, urine  2.  Supervision of high risk pregnancy, antepartum (Primary)  Centering Pregnancy, Session#4: Reviewed resources in [employer].   Facilitated discussion today:  Stages of Labor, Sign of labor, labor positions, coping strategies.  Reviewed deep relaxation breathing and use of pool noodle on low back for intrapartum massage.   Fundal height and FHR appropriate today unless noted otherwise in plan. Patient to continue group care.    3. Chronic hypertension affecting pregnancy On ASA and Labetalol  200mg  BID Has US  scheduled on 11/4-- will get q3-4 week US  for growth Weekly BPP at 32wks, will try to get on Centering days  - Comprehensive metabolic panel with GFR - Protein / creatinine ratio, urine  4. Multigravida of advanced maternal age in second trimester Delivery by EDD  Preterm labor symptoms and general obstetric precautions including but not limited to vaginal bleeding, contractions, leaking of fluid and fetal movement were reviewed in detail with the patient. Please refer to After Visit Summary for other counseling recommendations.   Return in about 2 weeks (around 02/18/2024) for Centering Pregnancy.  Future Appointments  Date Time Provider Department Center  02/09/2024  9:00 AM [facility] PROVIDER 1 [facility]  02/09/2024  9:30 AM [facility] US1 [facility]  02/18/2024  9:00 AM CENTERING PROVIDER [id number]  03/02/2024  9:00 AM CENTERING PROVIDER [facility]  03/17/2024  9:00 AM CENTERING PROVIDER [facility]  03/30/2024  9:00 AM CENTERING PROVIDER [facility]  04/14/2024  9:00 AM CENTERING PROVIDER [facility]  04/28/2024  9:00 AM CENTERING PROVIDER [facility]    Suzen Maryan Masters, MD

## 2024-02-05 LAB — COMPREHENSIVE METABOLIC PANEL WITH GFR
ALT: 9 IU/L (ref 0–32)
AST: 15 IU/L (ref 0–40)
Albumin: 3.7 g/dL — ABNORMAL LOW (ref 3.9–4.9)
Alkaline Phosphatase: 97 IU/L (ref 41–116)
BUN/Creatinine Ratio: 14 (ref 9–23)
BUN: 9 mg/dL (ref 6–24)
Bilirubin Total: 0.2 mg/dL (ref 0.0–1.2)
CO2: 20 mmol/L (ref 20–29)
Calcium: 9.2 mg/dL (ref 8.7–10.2)
Chloride: 102 mmol/L (ref 96–106)
Creatinine, Ser: 0.64 mg/dL (ref 0.57–1.00)
Globulin, Total: 2.9 g/dL (ref 1.5–4.5)
Glucose: 100 mg/dL — ABNORMAL HIGH (ref 70–99)
Potassium: 4.5 mmol/L (ref 3.5–5.2)
Sodium: 137 mmol/L (ref 134–144)
Total Protein: 6.6 g/dL (ref 6.0–8.5)
eGFR: 114 mL/min/1.73 (ref >59)

## 2024-02-05 LAB — PROTEIN / CREATININE RATIO, URINE
Creatinine, Urine: 160.1 mg/dL
Protein, Ur: 11.6 mg/dL
Protein/Creat Ratio: 72 mg/g{creat} (ref 0–200)

## 2024-02-09 ENCOUNTER — Other Ambulatory Visit: Payer: Self-pay | Admitting: *Deleted

## 2024-02-09 ENCOUNTER — Ambulatory Visit: Attending: Obstetrics and Gynecology

## 2024-02-09 ENCOUNTER — Ambulatory Visit (HOSPITAL_BASED_OUTPATIENT_CLINIC_OR_DEPARTMENT_OTHER): Admitting: Maternal & Fetal Medicine

## 2024-02-09 VITALS — BP 126/78 | HR 61

## 2024-02-09 DIAGNOSIS — D649 Anemia, unspecified: Secondary | ICD-10-CM

## 2024-02-09 DIAGNOSIS — Z3A25 25 weeks gestation of pregnancy: Secondary | ICD-10-CM | POA: Diagnosis not present

## 2024-02-09 DIAGNOSIS — O10919 Unspecified pre-existing hypertension complicating pregnancy, unspecified trimester: Secondary | ICD-10-CM | POA: Diagnosis not present

## 2024-02-09 DIAGNOSIS — O09299 Supervision of pregnancy with other poor reproductive or obstetric history, unspecified trimester: Secondary | ICD-10-CM

## 2024-02-09 DIAGNOSIS — O10012 Pre-existing essential hypertension complicating pregnancy, second trimester: Secondary | ICD-10-CM | POA: Diagnosis not present

## 2024-02-09 DIAGNOSIS — O09292 Supervision of pregnancy with other poor reproductive or obstetric history, second trimester: Secondary | ICD-10-CM | POA: Diagnosis not present

## 2024-02-09 DIAGNOSIS — O10912 Unspecified pre-existing hypertension complicating pregnancy, second trimester: Secondary | ICD-10-CM | POA: Insufficient documentation

## 2024-02-09 DIAGNOSIS — O09522 Supervision of elderly multigravida, second trimester: Secondary | ICD-10-CM | POA: Diagnosis not present

## 2024-02-09 DIAGNOSIS — O358XX0 Maternal care for other (suspected) fetal abnormality and damage, not applicable or unspecified: Secondary | ICD-10-CM | POA: Diagnosis present

## 2024-02-09 DIAGNOSIS — O99012 Anemia complicating pregnancy, second trimester: Secondary | ICD-10-CM

## 2024-02-09 NOTE — Progress Notes (Signed)
 Patient information  [name]  Patient MRN:   [id number]  Referring practice: MFM Referring Provider: [facility])  Problem List   Patient Active Problem List   Diagnosis Date Noted   Hx of intrauterine growth restriction in prior pregnancy, currently pregnant 11/27/2023   Hx of preeclampsia, prior pregnancy, currently pregnant 11/27/2023   Advanced maternal age in multigravida 11/27/2023   Chronic hypertension affecting pregnancy 06/12/2023   Language barrier 02/01/2016   Supervision of high risk pregnancy, antepartum 09/21/2015   Maternal Fetal medicine Consult  Gabrielle Park is a 43 y.o. H3E5985 at [redacted]w[redacted]d here for ultrasound and consultation. Gabrielle Park is doing well today with no acute concerns. Today we focused on the following:   Patient has a history of FGR and preeclampsia and has advanced maternal age as well.  For this reason she is here for growth ultrasound.  The estimated fetal weight is in the normal percentile.  She will return in 4 to 5 weeks for another growth ultrasound.  She has chronic hypertension that is well-controlled on labetalol .  The patient had time to ask questions that were answered to her satisfaction.  She verbalized understanding and agrees to proceed with the plan below.  Sonographic findings Single intrauterine pregnancy at 25w 3d.  Fetal cardiac activity:  Observed and appears normal. Presentation: Cephalic. Interval fetal anatomy appears normal. Fetal biometry shows the estimated fetal weight at the 38 percentile. Amniotic fluid volume: Within normal limits. MVP: 5.97 cm. Placenta: Posterior.  There are limitations of prenatal ultrasound such as the inability to detect certain abnormalities due to poor visualization. Various factors such as fetal position, gestational age and maternal body habitus may increase the difficulty in visualizing the fetal anatomy.     Recommendations - Serial growth ultrasounds - Antenatal testing starting around 32 weeks due to chronic hypertension on labetalol . - Blood pressure goal of less than 140/90.  An in person interpreter was used in the patient's language of preference for today's visit.   Review of Systems: A review of systems was performed and was negative except per HPI   Vitals and Physical Exam    02/09/2024    8:54 AM 02/04/2024    9:40 AM 02/04/2024    9:29 AM  Vitals with BMI  Weight   144 lbs 6 oz  Systolic 126 131 846  Diastolic 78 84 93  Pulse 61  66   Sitting comfortably on the sonogram table Nonlabored breathing Normal rate and rhythm Abdomen is nontender  Past pregnancies OB History  Gravida Para Term Preterm AB Living  6 4 4  0 1 4  SAB IAB Ectopic Multiple Live Births  1 0 0 0 4    # Outcome Date GA Lbr Len/2nd Weight Sex Type Anes PTL Lv  6 Current           5 SAB 05/2023          4 Term 03/05/16 [redacted]w[redacted]d 00:57 / 00:01 5 lb 6.2 oz (2.444 kg) M Vag-Spont None  LIV     Birth Comments: wnl  3 Term 06/24/09 [redacted]w[redacted]d  5 lb 8 oz (2.495 kg) M Vag-Spont EPI N LIV  2 Term 10/01/04 [redacted]w[redacted]d  6 lb 6 oz (2.892 kg) F Vag-Spont EPI N LIV  1 Term 10/09/01 [redacted]w[redacted]d  7 lb 2 oz (3.232 kg) F Vag-Spont EPI N LIV     I spent 20 minutes reviewing the patients chart, including labs  and images as well as counseling the patient about her medical conditions. Greater than 50% of the time was spent in direct face-to-face patient counseling.  Delora Smaller  MFM, [facility]   02/09/2024  10:13 AM

## 2024-02-18 ENCOUNTER — Ambulatory Visit (INDEPENDENT_AMBULATORY_CARE_PROVIDER_SITE_OTHER): Payer: Self-pay | Admitting: Family Medicine

## 2024-02-18 ENCOUNTER — Other Ambulatory Visit: Payer: Self-pay

## 2024-02-18 VITALS — BP 135/83 | HR 71 | Wt 148.0 lb

## 2024-02-18 DIAGNOSIS — O09522 Supervision of elderly multigravida, second trimester: Secondary | ICD-10-CM | POA: Diagnosis not present

## 2024-02-18 DIAGNOSIS — O10912 Unspecified pre-existing hypertension complicating pregnancy, second trimester: Secondary | ICD-10-CM

## 2024-02-18 DIAGNOSIS — Z3A26 26 weeks gestation of pregnancy: Secondary | ICD-10-CM

## 2024-02-18 DIAGNOSIS — O09292 Supervision of pregnancy with other poor reproductive or obstetric history, second trimester: Secondary | ICD-10-CM | POA: Diagnosis not present

## 2024-02-18 DIAGNOSIS — O0990 Supervision of high risk pregnancy, unspecified, unspecified trimester: Secondary | ICD-10-CM

## 2024-02-18 DIAGNOSIS — O10919 Unspecified pre-existing hypertension complicating pregnancy, unspecified trimester: Secondary | ICD-10-CM

## 2024-02-18 DIAGNOSIS — O0992 Supervision of high risk pregnancy, unspecified, second trimester: Secondary | ICD-10-CM

## 2024-02-18 DIAGNOSIS — O09299 Supervision of pregnancy with other poor reproductive or obstetric history, unspecified trimester: Secondary | ICD-10-CM

## 2024-02-18 DIAGNOSIS — O09523 Supervision of elderly multigravida, third trimester: Secondary | ICD-10-CM

## 2024-02-18 LAB — COMPREHENSIVE METABOLIC PANEL WITH GFR
ALT: 9 IU/L (ref 0–32)
AST: 16 IU/L (ref 0–40)
Albumin: 3.7 g/dL — ABNORMAL LOW (ref 3.9–4.9)
Alkaline Phosphatase: 104 IU/L (ref 41–116)
BUN/Creatinine Ratio: 20 (ref 9–23)
BUN: 12 mg/dL (ref 6–24)
Bilirubin Total: 0.2 mg/dL (ref 0.0–1.2)
CO2: 21 mmol/L (ref 20–29)
Calcium: 8.6 mg/dL — ABNORMAL LOW (ref 8.7–10.2)
Chloride: 104 mmol/L (ref 96–106)
Creatinine, Ser: 0.61 mg/dL (ref 0.57–1.00)
Globulin, Total: 2.6 g/dL (ref 1.5–4.5)
Glucose: 103 mg/dL — ABNORMAL HIGH (ref 70–99)
Potassium: 4.2 mmol/L (ref 3.5–5.2)
Sodium: 138 mmol/L (ref 134–144)
Total Protein: 6.3 g/dL (ref 6.0–8.5)
eGFR: 115 mL/min/1.73 (ref >59)

## 2024-02-18 NOTE — Progress Notes (Signed)
 PRENATAL VISIT NOTE: Centering Pregnancy Group 3D Session 5    Subjective:  Gabrielle Park is a 43 y.o. H3E5985 at [redacted]w[redacted]d being seen today for ongoing prenatal care.  She is currently monitored for the following issues for this high-risk pregnancy and has Supervision of high risk pregnancy, antepartum; Language barrier; Chronic hypertension affecting pregnancy; Hx of intrauterine growth restriction in prior pregnancy, currently pregnant; Hx of preeclampsia, prior pregnancy, currently pregnant; and Advanced maternal age in multigravida on their problem list.  Patient reports no complaints.  Contractions: Not present. Vag. Bleeding: None.  Movement: Present. Denies leaking of fluid.   The following portions of the patient's history were reviewed and updated as appropriate: allergies, current medications, past family history, past medical history, past social history, past surgical history and problem list.   Objective:   Vitals:   02/18/24 0926  BP: 135/83  Pulse: 71  Weight: 148 lb (67.1 kg)    Fetal Status:  Fetal Heart Rate (bpm): 151 Fundal Height: 26 cm Movement: Present    General: Alert, oriented and cooperative. Patient is in no acute distress.  Skin: Skin is warm and dry. No rash noted.   Cardiovascular: Normal heart rate noted  Respiratory: Normal respiratory effort, no problems with respiration noted  Abdomen: Soft, gravid, appropriate for gestational age.  Pain/Pressure: Present     Pelvic: Cervical exam deferred        Extremities: Normal range of motion.     Mental Status: Normal mood and affect. Normal behavior. Normal judgment and thought content.      11/27/2023   10:31 AM 06/12/2023    2:29 PM 06/04/2023   11:27 AM  Depression screen PHQ 2/9  Decreased Interest 0 0 0  Down, Depressed, Hopeless 0 0 0  PHQ - 2 Score 0 0 0  Altered sleeping 0 0 0  Tired, decreased energy 0 0 0  Change in appetite 0 0 0  Feeling bad or failure about yourself  0 0 0   Trouble concentrating 0 0 0  Moving slowly or fidgety/restless 0 0 0  Suicidal thoughts 0 0 0  PHQ-9 Score 0  0  0      Data saved with a previous flowsheet row definition        11/27/2023   10:31 AM 06/12/2023    2:29 PM 06/04/2023   11:27 AM 02/22/2016    2:40 PM  GAD 7 : Generalized Anxiety Score  Nervous, Anxious, on Edge 0 0 0 0  Control/stop worrying 0 0 0 0  Worry too much - different things 0 0 0 0  Trouble relaxing 0 0 0 0  Restless 0 0 0 0  Easily annoyed or irritable 0 0 0 0  Afraid - awful might happen 0 0 0 0  Total GAD 7 Score 0 0 0 0    Assessment and Plan:  Pregnancy: H3E5985 at [redacted]w[redacted]d 1. [redacted] weeks gestation of pregnancy  2. Supervision of high risk pregnancy, antepartum (Primary)  Centering Pregnancy, Session#5: Reviewed resources in [employer].  Facilitated discussion today: coping strategies, postpartum resources   Mindfulness activity with positive affirmations   Fundal height and FHR appropriate today unless noted otherwise in plan. Patient to continue group care.  Realized after session patient needs 3rd trimester labs-- asked RN to call to make sure patient comes to 11/25 visit fasting.   3. Multigravida of advanced maternal age in third trimester - US  Fetal BPP W/O Non Stress; Future starting at  34 weeks  4. Hx of preeclampsia, prior pregnancy, currently pregnant Baseline labs today - Comprehensive metabolic panel with GFR - Protein / creatinine ratio, urine  5. Chronic hypertension affecting pregnancy Stable BPs on labetalol  Weekly BPPs starting at 32 weeks needed Has growth US  on 12/8 scheduled   Preterm labor symptoms and general obstetric precautions including but not limited to vaginal bleeding, contractions, leaking of fluid and fetal movement were reviewed in detail with the patient. Please refer to After Visit Summary for other counseling recommendations.   Return in about 13 days (around 03/02/2024).  Future Appointments   Date Time Provider Department Center  03/02/2024  9:00 AM CENTERING PROVIDER [facility]  03/15/2024 11:15 AM [facility] PROVIDER 1 [facility]  03/15/2024 11:30 AM WMC-MFC US2 [facility]  03/17/2024  9:00 AM CENTERING PROVIDER [facility]  03/30/2024  9:00 AM CENTERING PROVIDER [facility]  04/14/2024  9:00 AM CENTERING PROVIDER [facility]  04/28/2024  9:00 AM CENTERING PROVIDER [facility]  05/12/2024  9:00 AM CENTERING PROVIDER [facility]  05/26/2024  9:00 AM CENTERING PROVIDER [facility]    Suzen Maryan Masters, MD

## 2024-02-20 LAB — PROTEIN / CREATININE RATIO, URINE
Creatinine, Urine: 156.2 mg/dL
Protein, Ur: 12.8 mg/dL
Protein/Creat Ratio: 82 mg/g{creat} (ref 0–200)

## 2024-03-02 ENCOUNTER — Other Ambulatory Visit

## 2024-03-02 ENCOUNTER — Ambulatory Visit (INDEPENDENT_AMBULATORY_CARE_PROVIDER_SITE_OTHER): Payer: Self-pay | Admitting: Family Medicine

## 2024-03-02 ENCOUNTER — Other Ambulatory Visit: Payer: Self-pay

## 2024-03-02 VITALS — BP 137/89 | HR 61 | Wt 149.4 lb

## 2024-03-02 DIAGNOSIS — O0990 Supervision of high risk pregnancy, unspecified, unspecified trimester: Secondary | ICD-10-CM

## 2024-03-02 DIAGNOSIS — O09299 Supervision of pregnancy with other poor reproductive or obstetric history, unspecified trimester: Secondary | ICD-10-CM

## 2024-03-02 DIAGNOSIS — Z3A28 28 weeks gestation of pregnancy: Secondary | ICD-10-CM

## 2024-03-02 DIAGNOSIS — O10919 Unspecified pre-existing hypertension complicating pregnancy, unspecified trimester: Secondary | ICD-10-CM

## 2024-03-02 DIAGNOSIS — O0993 Supervision of high risk pregnancy, unspecified, third trimester: Secondary | ICD-10-CM | POA: Diagnosis not present

## 2024-03-02 DIAGNOSIS — O09523 Supervision of elderly multigravida, third trimester: Secondary | ICD-10-CM

## 2024-03-02 DIAGNOSIS — Z758 Other problems related to medical facilities and other health care: Secondary | ICD-10-CM

## 2024-03-02 DIAGNOSIS — O09293 Supervision of pregnancy with other poor reproductive or obstetric history, third trimester: Secondary | ICD-10-CM | POA: Diagnosis not present

## 2024-03-02 DIAGNOSIS — Z603 Acculturation difficulty: Secondary | ICD-10-CM

## 2024-03-02 DIAGNOSIS — O10913 Unspecified pre-existing hypertension complicating pregnancy, third trimester: Secondary | ICD-10-CM

## 2024-03-02 NOTE — Progress Notes (Unsigned)
 PRENATAL VISIT NOTE  Subjective:  Gabrielle Park is a 43 y.o. H3E5985 at [redacted]w[redacted]d being seen today for ongoing prenatal care.  She is currently monitored for the following issues for this {Blank single:19197::high-risk,low-risk} pregnancy and has Supervision of high risk pregnancy, antepartum; Language barrier; Chronic hypertension affecting pregnancy; Hx of intrauterine growth restriction in prior pregnancy, currently pregnant; Hx of preeclampsia, prior pregnancy, currently pregnant; and Advanced maternal age in multigravida on their problem list.  Patient reports {sx:14538}.   .  .   . Denies leaking of fluid.   The following portions of the patient's history were reviewed and updated as appropriate: allergies, current medications, past family history, past medical history, past social history, past surgical history and problem list.   Objective:   There were no vitals filed for this visit.  Fetal Status:           General: Alert, oriented and cooperative. Patient is in no acute distress.  Skin: Skin is warm and dry. No rash noted.   Cardiovascular: Normal heart rate noted  Respiratory: Normal respiratory effort, no problems with respiration noted  Abdomen: Soft, gravid, appropriate for gestational age.        Pelvic: {Blank single:19197::Cervical exam performed in the presence of a chaperone,Cervical exam deferred}        Extremities: Normal range of motion.     Mental Status: Normal mood and affect. Normal behavior. Normal judgment and thought content.      11/27/2023   10:31 AM 06/12/2023    2:29 PM 06/04/2023   11:27 AM  Depression screen PHQ 2/9  Decreased Interest 0 0 0  Down, Depressed, Hopeless 0 0 0  PHQ - 2 Score 0 0 0  Altered sleeping 0 0 0  Tired, decreased energy 0 0 0  Change in appetite 0 0 0  Feeling bad or failure about yourself  0 0 0  Trouble concentrating 0 0 0  Moving slowly or fidgety/restless 0 0 0  Suicidal thoughts 0 0 0  PHQ-9 Score 0   0  0      Data saved with a previous flowsheet row definition        11/27/2023   10:31 AM 06/12/2023    2:29 PM 06/04/2023   11:27 AM 02/22/2016    2:40 PM  GAD 7 : Generalized Anxiety Score  Nervous, Anxious, on Edge 0 0 0 0  Control/stop worrying 0 0 0 0  Worry too much - different things 0 0 0 0  Trouble relaxing 0 0 0 0  Restless 0 0 0 0  Easily annoyed or irritable 0 0 0 0  Afraid - awful might happen 0 0 0 0  Total GAD 7 Score 0 0 0 0    Assessment and Plan:  Pregnancy: H3E5985 at [redacted]w[redacted]d 1. [redacted] weeks gestation of pregnancy ***  2. Supervision of high risk pregnancy, antepartum (Primary) ***  3. Hx of preeclampsia, prior pregnancy, currently pregnant ***  4. Multigravida of advanced maternal age in third trimester ***  5. Chronic hypertension affecting pregnancy ***  6. Hx of intrauterine growth restriction in prior pregnancy, currently pregnant ***  7. Language barrier ***  {Blank single:19197::Term,Preterm} labor symptoms and general obstetric precautions including but not limited to vaginal bleeding, contractions, leaking of fluid and fetal movement were reviewed in detail with the patient. Please refer to After Visit Summary for other counseling recommendations.   No follow-ups on file.  Future Appointments  Date Time Provider Department  Center  03/02/2024  9:00 AM [place] Suzen Octave, MD [facility]  03/15/2024 11:15 AM [facility] PROVIDER 1 [facility]  03/15/2024 11:30 AM [facility]MC-MFC US2 [facility]  03/17/2024  9:00 AM CENTERING PROVIDER [facility]  03/30/2024  8:15 AM WMC-CWH US2 [facility]IMG [facility]  03/30/2024  9:00 AM CENTERING PROVIDER [facility]  04/06/2024  8:55 AM W[facility]CWH US2 [facility]  04/13/2024  8:55 AM WMC[facility]CWH US2 [facility]  04/14/2024  9:00 AM CENTERING PROVIDER [facility]  04/20/2024  8:55 AM WMC-CWH US2 [facility]  04/27/2024  8:55 AM WMC-CWH US2 [facility]MC-IMG [facility]  04/28/2024  9:00 AM CENTERING PROVIDER [facility]  05/04/2024  8:55 AM WMC-CWH  US2 [facility]  05/11/2024  8:55 AM [facility]CWH US2 [facility]  05/12/2024  9:00 AM CENTERING PROVIDER [facility]  05/26/2024  9:00 AM CENTERING PROVIDER [facility]    Suzen Octave Eldonna, MD

## 2024-03-03 LAB — CBC
Hematocrit: 38.8 % (ref 34.0–46.6)
Hemoglobin: 12.2 g/dL (ref 11.1–15.9)
MCH: 27.1 pg (ref 26.6–33.0)
MCHC: 31.4 g/dL — ABNORMAL LOW (ref 31.5–35.7)
MCV: 86 fL (ref 79–97)
Platelets: 182 x10E3/uL (ref 150–450)
RBC: 4.51 x10E6/uL (ref 3.77–5.28)
RDW: 15.2 % (ref 11.7–15.4)
WBC: 7.7 x10E3/uL (ref 3.4–10.8)

## 2024-03-03 LAB — GLUCOSE TOLERANCE, 2 HOURS W/ 1HR
Glucose, 1 hour: 153 mg/dL (ref 70–179)
Glucose, 2 hour: 122 mg/dL (ref 70–152)
Glucose, Fasting: 76 mg/dL (ref 70–91)

## 2024-03-03 LAB — HIV ANTIBODY (ROUTINE TESTING W REFLEX): HIV Screen 4th Generation wRfx: NONREACTIVE

## 2024-03-03 LAB — SYPHILIS: RPR W/REFLEX TO RPR TITER AND TREPONEMAL ANTIBODIES, TRADITIONAL SCREENING AND DIAGNOSIS ALGORITHM: RPR Ser Ql: NONREACTIVE

## 2024-03-08 ENCOUNTER — Ambulatory Visit: Payer: Self-pay | Admitting: Family Medicine

## 2024-03-15 ENCOUNTER — Ambulatory Visit (HOSPITAL_BASED_OUTPATIENT_CLINIC_OR_DEPARTMENT_OTHER): Admitting: Obstetrics

## 2024-03-15 ENCOUNTER — Other Ambulatory Visit: Payer: Self-pay | Admitting: *Deleted

## 2024-03-15 ENCOUNTER — Ambulatory Visit: Attending: Obstetrics and Gynecology

## 2024-03-15 VITALS — BP 122/72 | HR 66

## 2024-03-15 DIAGNOSIS — O09523 Supervision of elderly multigravida, third trimester: Secondary | ICD-10-CM

## 2024-03-15 DIAGNOSIS — O09299 Supervision of pregnancy with other poor reproductive or obstetric history, unspecified trimester: Secondary | ICD-10-CM

## 2024-03-15 DIAGNOSIS — O10919 Unspecified pre-existing hypertension complicating pregnancy, unspecified trimester: Secondary | ICD-10-CM

## 2024-03-15 DIAGNOSIS — Z3A30 30 weeks gestation of pregnancy: Secondary | ICD-10-CM

## 2024-03-15 DIAGNOSIS — O09522 Supervision of elderly multigravida, second trimester: Secondary | ICD-10-CM

## 2024-03-15 NOTE — Progress Notes (Signed)
 MFM Consult Note  Gabrielle Park is currently at [redacted]w[redacted]d. She has been followed due to advanced maternal age (43 years old) and chronic hypertension treated with labetalol .    She denies any problems since her last exam.  Her blood pressure today was 122/72..  Sonographic findings Single intrauterine pregnancy at 30w 3d.  Fetal cardiac activity:  Observed and appears normal. Presentation: Cephalic. Fetal biometry shows the estimated fetal weight of 3 lb 6 oz,  1525g (29%). Amniotic fluid volume: Within normal limits. AFI: 12.59cm.  MVP: 3.93 cm. Placenta: Posterior. Fetal movements were noted throughout today's ultrasound exam.  Due to advanced maternal age and chronic hypertension, she is already scheduled to start weekly fetal testing at 32 weeks in your office.  She should continue weekly fetal testing until delivery.    A follow-up growth scan was scheduled in our office in 4 weeks.  Due to advanced maternal age and chronic hypertension, delivery may be considered at around 38 weeks.    Preeclampsia precautions were reviewed with the patient today.  All conversations were held with the patient today with the help of a Spanish interpreter.  The patient stated that all of her questions were answered.   A total of 20 minutes was spent counseling and coordinating the care for this patient.  Greater than 50% of the time was spent in direct face-to-face contact.

## 2024-03-17 ENCOUNTER — Other Ambulatory Visit: Payer: Self-pay

## 2024-03-17 ENCOUNTER — Ambulatory Visit: Payer: Self-pay

## 2024-03-17 VITALS — BP 127/88 | Wt 148.0 lb

## 2024-03-17 DIAGNOSIS — Z603 Acculturation difficulty: Secondary | ICD-10-CM | POA: Diagnosis not present

## 2024-03-17 DIAGNOSIS — Z758 Other problems related to medical facilities and other health care: Secondary | ICD-10-CM | POA: Diagnosis not present

## 2024-03-17 DIAGNOSIS — Z3A30 30 weeks gestation of pregnancy: Secondary | ICD-10-CM

## 2024-03-17 DIAGNOSIS — O09523 Supervision of elderly multigravida, third trimester: Secondary | ICD-10-CM | POA: Diagnosis not present

## 2024-03-17 DIAGNOSIS — O0993 Supervision of high risk pregnancy, unspecified, third trimester: Secondary | ICD-10-CM | POA: Diagnosis not present

## 2024-03-17 DIAGNOSIS — Z23 Encounter for immunization: Secondary | ICD-10-CM

## 2024-03-17 DIAGNOSIS — O10913 Unspecified pre-existing hypertension complicating pregnancy, third trimester: Secondary | ICD-10-CM | POA: Diagnosis not present

## 2024-03-17 DIAGNOSIS — O09299 Supervision of pregnancy with other poor reproductive or obstetric history, unspecified trimester: Secondary | ICD-10-CM

## 2024-03-17 DIAGNOSIS — O10919 Unspecified pre-existing hypertension complicating pregnancy, unspecified trimester: Secondary | ICD-10-CM

## 2024-03-17 DIAGNOSIS — O0990 Supervision of high risk pregnancy, unspecified, unspecified trimester: Secondary | ICD-10-CM

## 2024-03-17 DIAGNOSIS — O09293 Supervision of pregnancy with other poor reproductive or obstetric history, third trimester: Secondary | ICD-10-CM

## 2024-03-17 NOTE — Addendum Note (Signed)
 Addended by: MICHAE LOA CROME on: 03/17/2024 05:32 PM   Modules accepted: Orders

## 2024-03-17 NOTE — Progress Notes (Signed)
 PRENATAL VISIT NOTE:Centering Pregnancy Group 7, Session 7    Subjective:  Gabrielle Park is a 43 y.o. H3E5985 at [redacted]w[redacted]d being seen today for ongoing prenatal care.  She is currently monitored for the following issues for this high-risk pregnancy and has Supervision of high risk pregnancy, antepartum; Language barrier; Chronic hypertension affecting pregnancy; Hx of intrauterine growth restriction in prior pregnancy, currently pregnant; Hx of preeclampsia, prior pregnancy, currently pregnant; and Advanced maternal age in multigravida on their problem list.  Patient reports no complaints.  Contractions: Not present. Vag. Bleeding: None.  Movement: Present. Denies leaking of fluid.   The following portions of the patient's history were reviewed and updated as appropriate: allergies, current medications, past family history, past medical history, past social history, past surgical history and problem list.   Objective:   Vitals:   03/17/24 0914  BP: 127/88  Weight: 148 lb (67.1 kg)    Fetal Status:  Fetal Heart Rate (bpm): 142 Fundal Height: 31 cm Movement: Present    General: Alert, oriented and cooperative. Patient is in no acute distress.  Skin: Skin is warm and dry. No rash noted.   Cardiovascular: Normal heart rate noted  Respiratory: Normal respiratory effort, no problems with respiration noted  Abdomen: Soft, gravid, appropriate for gestational age.  Pain/Pressure: Absent     Pelvic: Cervical exam deferred        Extremities: Normal range of motion.     Mental Status: Normal mood and affect. Normal behavior. Normal judgment and thought content.      03/17/2024   11:14 AM 11/27/2023   10:31 AM 06/12/2023    2:29 PM  Depression screen PHQ 2/9  Decreased Interest 0 0 0  Down, Depressed, Hopeless 0 0 0  PHQ - 2 Score 0 0 0  Altered sleeping 0 0 0  Tired, decreased energy 0 0 0  Change in appetite 0 0 0  Feeling bad or failure about yourself  0 0 0  Trouble  concentrating 0 0 0  Moving slowly or fidgety/restless 0 0 0  Suicidal thoughts 0 0 0  PHQ-9 Score 0 0  0   Difficult doing work/chores Not difficult at all       Data saved with a previous flowsheet row definition        03/17/2024   11:14 AM 11/27/2023   10:31 AM 06/12/2023    2:29 PM 06/04/2023   11:27 AM  GAD 7 : Generalized Anxiety Score  Nervous, Anxious, on Edge 0 0 0 0  Control/stop worrying 0 0 0 0  Worry too much - different things 0 0 0 0  Trouble relaxing 0 0 0 0  Restless 0 0 0 0  Easily annoyed or irritable 0 0 0 0  Afraid - awful might happen 0 0 0 0  Total GAD 7 Score 0 0 0 0  Anxiety Difficulty Not difficult at all       Assessment and Plan:  Pregnancy: H3E5985 at [redacted]w[redacted]d 1. Chronic hypertension affecting pregnancy (Primary) Stable on Labetalol  No sx/s of PEC Baseline labs were obtained Has antenatal testing scheduled starting at 32 wk IOL at 39 weeks (2/6)  2. Multigravida of advanced maternal age in third trimester  3. Hx of intrauterine growth restriction in prior pregnancy, currently pregnant Growth US  scheduled  4. Hx of preeclampsia, prior pregnancy, currently pregnant  5. Supervision of high risk pregnancy, antepartum  Centering Pregnancy, Session#7: Reviewed resources in [employer].   Facilitated discussion today:  postpartum communication,care  and postpartum mood changes Mindfulness activity with mindful listening  Fundal height and FHR appropriate today unless noted otherwise in plan. Patient to continue group care.  Reviewed PHQ9 and GAD7- no concerns BTS signed today 03/17/24  Tdap today  6. Language barrier  7. [redacted] weeks gestation of pregnancy  Preterm labor symptoms and general obstetric precautions including but not limited to vaginal bleeding, contractions, leaking of fluid and fetal movement were reviewed in detail with the patient. Please refer to After Visit Summary for other counseling recommendations.   Return in  about 13 days (around 03/30/2024) for Centering Pregnancy.  Future Appointments  Date Time Provider Department Center  03/30/2024  8:15 AM [facility] US2 [facility]  03/30/2024  9:00 AM CENTERING PROVIDER [facility]  04/06/2024  8:15 AM [facility] US2 [facility]  04/13/2024  8:55 AM [facility] US2 [facility]  04/14/2024  9:00 AM CENTERING PROVIDER [facility]  04/16/2024 11:15 AM [facility] PROVIDER 1 [facility]  04/16/2024 11:30 AM [facility] US4 [facility]  04/20/2024  8:55 AM [facility] US2 [facility]  04/27/2024  8:55 AM [facility] US2 [facility]  04/28/2024  9:00 AM CENTERING PROVIDER [facility]  05/04/2024  8:55 AM [facility] US2 [facility]  05/11/2024  8:55 AM [facility] US2 [facility]  05/12/2024  9:00 AM CENTERING PROVIDER [facility]  05/26/2024  9:00 AM CENTERING PROVIDER [facility]    Suzen Maryan Masters, MD

## 2024-03-22 ENCOUNTER — Encounter: Payer: Self-pay | Admitting: *Deleted

## 2024-03-30 ENCOUNTER — Other Ambulatory Visit (INDEPENDENT_AMBULATORY_CARE_PROVIDER_SITE_OTHER)

## 2024-03-30 ENCOUNTER — Ambulatory Visit (INDEPENDENT_AMBULATORY_CARE_PROVIDER_SITE_OTHER): Payer: Self-pay | Admitting: Family Medicine

## 2024-03-30 ENCOUNTER — Other Ambulatory Visit: Payer: Self-pay

## 2024-03-30 ENCOUNTER — Other Ambulatory Visit

## 2024-03-30 VITALS — BP 137/87 | HR 74 | Wt 149.4 lb

## 2024-03-30 DIAGNOSIS — O0993 Supervision of high risk pregnancy, unspecified, third trimester: Secondary | ICD-10-CM

## 2024-03-30 DIAGNOSIS — O09523 Supervision of elderly multigravida, third trimester: Secondary | ICD-10-CM

## 2024-03-30 DIAGNOSIS — Z3A32 32 weeks gestation of pregnancy: Secondary | ICD-10-CM | POA: Diagnosis not present

## 2024-03-30 DIAGNOSIS — O09299 Supervision of pregnancy with other poor reproductive or obstetric history, unspecified trimester: Secondary | ICD-10-CM

## 2024-03-30 DIAGNOSIS — O10913 Unspecified pre-existing hypertension complicating pregnancy, third trimester: Secondary | ICD-10-CM

## 2024-03-30 DIAGNOSIS — O09293 Supervision of pregnancy with other poor reproductive or obstetric history, third trimester: Secondary | ICD-10-CM

## 2024-03-30 DIAGNOSIS — O0990 Supervision of high risk pregnancy, unspecified, unspecified trimester: Secondary | ICD-10-CM

## 2024-03-30 DIAGNOSIS — O10919 Unspecified pre-existing hypertension complicating pregnancy, unspecified trimester: Secondary | ICD-10-CM

## 2024-03-30 MED ORDER — AMOXICILLIN-POT CLAVULANATE 875-125 MG PO TABS: 1.0000 | ORAL_TABLET | Freq: Two times a day (BID) | ORAL | 0 refills | Status: DC

## 2024-03-30 NOTE — Progress Notes (Signed)
 "  PRENATAL VISIT NOTE  Subjective:  Gabrielle Park is a 43 y.o. H3E5985 at [redacted]w[redacted]d being seen today for ongoing prenatal care.  She is currently monitored for the following issues for this high-risk pregnancy and has Supervision of high risk pregnancy, antepartum; Language barrier; Chronic hypertension affecting pregnancy; Hx of intrauterine growth restriction in prior pregnancy, currently pregnant; Hx of preeclampsia, prior pregnancy, currently pregnant; and Advanced maternal age in multigravida on their problem list.  Patient reports trouble hearing, started yesterday. No fevers or congestion.  Contractions: Irregular. Vag. Bleeding: None.  Movement: Present. Denies leaking of fluid.   The following portions of the patient's history were reviewed and updated as appropriate: allergies, current medications, past family history, past medical history, past social history, past surgical history and problem list.   Objective:   Vitals:   03/30/24 0910  BP: 137/87  Pulse: 74  Weight: 149 lb 6.4 oz (67.8 kg)    Fetal Status:  Fetal Heart Rate (bpm): 135 Fundal Height: 33 cm Movement: Present    General: Alert, oriented and cooperative. Patient is in no acute distress.  Skin: Skin is warm and dry. No rash noted.   Cardiovascular: Normal heart rate noted  Respiratory: Normal respiratory effort, no problems with respiration noted  Abdomen: Soft, gravid, appropriate for gestational age.  Pain/Pressure: Present     Pelvic: Cervical exam deferred        Extremities: Normal range of motion.     Mental Status: Normal mood and affect. Normal behavior. Normal judgment and thought content.      03/17/2024   11:14 AM 11/27/2023   10:31 AM 06/12/2023    2:29 PM  Depression screen PHQ 2/9  Decreased Interest 0 0 0  Down, Depressed, Hopeless 0 0 0  PHQ - 2 Score 0 0 0  Altered sleeping 0 0 0  Tired, decreased energy 0 0 0  Change in appetite 0 0 0  Feeling bad or failure about yourself  0 0 0   Trouble concentrating 0 0 0  Moving slowly or fidgety/restless 0 0 0  Suicidal thoughts 0 0 0  PHQ-9 Score 0 0  0   Difficult doing work/chores Not difficult at all       Data saved with a previous flowsheet row definition        03/17/2024   11:14 AM 11/27/2023   10:31 AM 06/12/2023    2:29 PM 06/04/2023   11:27 AM  GAD 7 : Generalized Anxiety Score  Nervous, Anxious, on Edge 0 0 0 0  Control/stop worrying 0 0 0 0  Worry too much - different things 0 0 0 0  Trouble relaxing 0 0 0 0  Restless 0 0 0 0  Easily annoyed or irritable 0 0 0 0  Afraid - awful might happen 0 0 0 0  Total GAD 7 Score 0 0 0 0  Anxiety Difficulty Not difficult at all       Assessment and Plan:  Pregnancy: H3E5985 at [redacted]w[redacted]d  1. Supervision of high risk pregnancy, antepartum (Primary)   Centering Pregnancy, Session#8: Reviewed resources in [employer].   Facilitated discussion today:  Newborn safety, delivery planning, postpartum planning and follow other topics (family planning, breastfeeding)  Fundal height and FHR appropriate today unless noted otherwise in plan. Patient to continue group care.  Desires BTS and signed consent  2. Hx of preeclampsia, prior pregnancy, currently pregnant BP WNL On ASA  3. Chronic hypertension affecting pregnancy On Labetalol   Stable BP IOL 38-39  4. Multigravida of advanced maternal age in third trimester  5. Hx of intrauterine growth restriction in prior pregnancy, currently pregnant Repeat US  in 3rd trimester   Preterm labor symptoms and general obstetric precautions including but not limited to vaginal bleeding, contractions, leaking of fluid and fetal movement were reviewed in detail with the patient. Please refer to After Visit Summary for other counseling recommendations.   Return in about 15 days (around 04/14/2024) for Centering.  Future Appointments  Date Time Provider Department Center  04/06/2024  8:15 AM [facility] US2 [facility]  04/13/2024   8:55 AM [facility] US2 [facility]  04/14/2024  9:00 AM CENTERING PROVIDER [facility]  04/16/2024 11:15 AM [facility] PROVIDER 1 [facility]  04/16/2024 11:30 AM [facility] US4 [facility]  04/20/2024  8:55 AM [facility] US2 [facility]  04/27/2024  8:55 AM [facility] US2 [facility]  04/28/2024  9:00 AM CENTERING PROVIDER [facility]  05/04/2024  8:55 AM [facility] US2 [facility]  05/11/2024  8:55 AM [facility] US2 [facility]  05/12/2024  9:00 AM CENTERING PROVIDER [facility]  05/26/2024  9:00 AM CENTERING PROVIDER [facility]    Suzen Maryan Masters, MD "

## 2024-04-05 ENCOUNTER — Other Ambulatory Visit: Payer: Self-pay

## 2024-04-05 DIAGNOSIS — O10919 Unspecified pre-existing hypertension complicating pregnancy, unspecified trimester: Secondary | ICD-10-CM

## 2024-04-05 DIAGNOSIS — O09523 Supervision of elderly multigravida, third trimester: Secondary | ICD-10-CM

## 2024-04-05 DIAGNOSIS — O0990 Supervision of high risk pregnancy, unspecified, unspecified trimester: Secondary | ICD-10-CM

## 2024-04-06 ENCOUNTER — Other Ambulatory Visit

## 2024-04-08 NOTE — L&D Delivery Note (Addendum)
 OB/GYN Faculty Practice Delivery Note  Gabrielle Park is a 44 y.o. H3E5985 s/p NSVD at [redacted]w[redacted]d. She was admitted for IOL for cHTN.   ROM: 4h 62m with clear fluid GBS Status: positive Maximum Maternal Temperature: 98.79F  Labor Progress: Initial SVE 1.5/50/-3. Induced with AROM, cytotec , pitocin . Progressed to complete.  Delivery Date/Time: 05/14/24 at 16:14 Delivery: Called to room and patient was complete and pushing. Head delivered ROA. No nuchal cord present. Shoulder and body delivered in usual fashion. Infant with spontaneous cry, placed on mother's abdomen, dried and stimulated. Cord clamped x 2 after 1-minute delay, and cut by FOB. Cord blood drawn. Placenta delivered spontaneously, intact, with 3-vessel cord. Fundus firm with massage and Pitocin . Labia, perineum, vagina, and cervix inspected, with first degree perineal laceration found but not requiring repair  Placenta: intact Complications: none Lacerations: first degree perineal  QBL: 425 ml Analgesia: epidural  Infant: viable female  APGARs 8,9  2790 g  Gabrielle Alena Morrison, MD 05/14/2024, 4:46 PM  I was present for the entire delivery of baby and placenta and inspection of perineum and agree with above.  Pitocin , TXA and Cytotec  given prophylactically due to being of Mag Sulfate and low Plts. Pt still requests BTL. Will keep NPO and schedule for 1800.  Plan Mag Sulfate x 24 hours Repeat now and am. Labetalol  200 BID. May change in am. Lasix and Gabrielle Park, Gabrielle Park , CNM 05/14/2024 7:22 PM

## 2024-04-13 ENCOUNTER — Other Ambulatory Visit

## 2024-04-14 ENCOUNTER — Ambulatory Visit: Payer: Self-pay | Admitting: Family Medicine

## 2024-04-14 ENCOUNTER — Other Ambulatory Visit: Payer: Self-pay

## 2024-04-14 VITALS — BP 125/81 | HR 67 | Wt 152.4 lb

## 2024-04-14 DIAGNOSIS — O10919 Unspecified pre-existing hypertension complicating pregnancy, unspecified trimester: Secondary | ICD-10-CM

## 2024-04-14 DIAGNOSIS — O10913 Unspecified pre-existing hypertension complicating pregnancy, third trimester: Secondary | ICD-10-CM

## 2024-04-14 DIAGNOSIS — O09293 Supervision of pregnancy with other poor reproductive or obstetric history, third trimester: Secondary | ICD-10-CM | POA: Diagnosis not present

## 2024-04-14 DIAGNOSIS — O0990 Supervision of high risk pregnancy, unspecified, unspecified trimester: Secondary | ICD-10-CM

## 2024-04-14 DIAGNOSIS — Z3A34 34 weeks gestation of pregnancy: Secondary | ICD-10-CM | POA: Diagnosis not present

## 2024-04-14 DIAGNOSIS — H9193 Unspecified hearing loss, bilateral: Secondary | ICD-10-CM

## 2024-04-14 DIAGNOSIS — O0993 Supervision of high risk pregnancy, unspecified, third trimester: Secondary | ICD-10-CM

## 2024-04-14 DIAGNOSIS — O09299 Supervision of pregnancy with other poor reproductive or obstetric history, unspecified trimester: Secondary | ICD-10-CM

## 2024-04-14 DIAGNOSIS — O09523 Supervision of elderly multigravida, third trimester: Secondary | ICD-10-CM

## 2024-04-14 NOTE — Progress Notes (Unsigned)
 "  PRENATAL VISIT NOTE  Subjective:  Gabrielle Park is a 44 y.o. H3E5985 at [redacted]w[redacted]d being seen today for ongoing prenatal care.  She is currently monitored for the following issues for this high-risk pregnancy and has Supervision of high risk pregnancy, antepartum; Language barrier; Chronic hypertension affecting pregnancy; Hx of intrauterine growth restriction in prior pregnancy, currently pregnant; Hx of preeclampsia, prior pregnancy, currently pregnant; and Advanced maternal age in multigravida on their problem list.  Patient reports no complaints.  Contractions: Irregular. Vag. Bleeding: None.  Movement: Present. Denies leaking of fluid.   The following portions of the patient's history were reviewed and updated as appropriate: allergies, current medications, past family history, past medical history, past social history, past surgical history and problem list.   Objective:   Vitals:   04/14/24 0959  BP: 125/81  Pulse: 67  Weight: 152 lb 6.4 oz (69.1 kg)    Fetal Status:      Movement: Present    General: Alert, oriented and cooperative. Patient is in no acute distress.  Skin: Skin is warm and dry. No rash noted.   Cardiovascular: Normal heart rate noted  Respiratory: Normal respiratory effort, no problems with respiration noted  Abdomen: Soft, gravid, appropriate for gestational age.  Pain/Pressure: Present     Pelvic: Cervical exam deferred        Extremities: Normal range of motion.     Mental Status: Normal mood and affect. Normal behavior. Normal judgment and thought content.      03/17/2024   11:14 AM 11/27/2023   10:31 AM 06/12/2023    2:29 PM  Depression screen PHQ 2/9  Decreased Interest 0 0 0  Down, Depressed, Hopeless 0 0 0  PHQ - 2 Score 0 0 0  Altered sleeping 0 0 0  Tired, decreased energy 0 0 0  Change in appetite 0 0 0  Feeling bad or failure about yourself  0 0 0  Trouble concentrating 0 0 0  Moving slowly or fidgety/restless 0 0 0  Suicidal  thoughts 0 0 0  PHQ-9 Score 0 0  0   Difficult doing work/chores Not difficult at all       Data saved with a previous flowsheet row definition        03/17/2024   11:14 AM 11/27/2023   10:31 AM 06/12/2023    2:29 PM 06/04/2023   11:27 AM  GAD 7 : Generalized Anxiety Score  Nervous, Anxious, on Edge 0 0 0 0  Control/stop worrying 0 0 0 0  Worry too much - different things 0 0 0 0  Trouble relaxing 0 0 0 0  Restless 0 0 0 0  Easily annoyed or irritable 0 0 0 0  Afraid - awful might happen 0 0 0 0  Total GAD 7 Score 0 0 0 0  Anxiety Difficulty Not difficult at all       Assessment and Plan:  Pregnancy: H3E5985 at [redacted]w[redacted]d 1. Chronic hypertension affecting pregnancy ***  2. Multigravida of advanced maternal age in third trimester ***  3. Hx of intrauterine growth restriction in prior pregnancy, currently pregnant ***  4. Hx of preeclampsia, prior pregnancy, currently pregnant BP stable  5. Supervision of high risk pregnancy, antepartum (Primary)   Centering Pregnancy, Session#8: Reviewed resources in [employer].   Facilitated discussion today:delivery planning, postpartum planning and follow other topics (family planning, breastfeeding)  Fundal height and FHR appropriate today unless noted otherwise in plan. Patient to continue group care.  6. [redacted] weeks gestation of pregnancy ***  {Blank single:19197::Term,Preterm} labor symptoms and general obstetric precautions including but not limited to vaginal bleeding, contractions, leaking of fluid and fetal movement were reviewed in detail with the patient. Please refer to After Visit Summary for other counseling recommendations.   Return in about 2 weeks (around 04/28/2024) for Centering.  Future Appointments  Date Time Provider Department Center  04/20/2024  8:55 AM [facility] US2 [facility]  04/23/2024  1:00 PM [facility] PROVIDER 1 [facility]  04/23/2024  1:15 PM [facility] NST [facility]  04/27/2024  8:55 AM [facility]  US2 [facility]  04/28/2024  9:00 AM CENTERING PROVIDER [facility]  05/04/2024  8:55 AM [facility] US2 [facility]  05/11/2024  8:55 AM [facility] US2 [facility]  05/12/2024  9:00 AM CENTERING PROVIDER [facility]  05/26/2024  9:00 AM CENTERING PROVIDER [facility]    Suzen Maryan Masters, MD "

## 2024-04-16 ENCOUNTER — Inpatient Hospital Stay (HOSPITAL_COMMUNITY)
Admission: AD | Admit: 2024-04-16 | Discharge: 2024-04-16 | Disposition: A | Discharge status: Home / Self Care | Attending: Obstetrics & Gynecology | Admitting: Obstetrics & Gynecology

## 2024-04-16 ENCOUNTER — Other Ambulatory Visit: Payer: Self-pay | Admitting: *Deleted

## 2024-04-16 ENCOUNTER — Encounter (HOSPITAL_COMMUNITY): Payer: Self-pay | Admitting: Obstetrics & Gynecology

## 2024-04-16 ENCOUNTER — Ambulatory Visit: Attending: Obstetrics | Admitting: Obstetrics

## 2024-04-16 ENCOUNTER — Ambulatory Visit

## 2024-04-16 ENCOUNTER — Other Ambulatory Visit: Payer: Self-pay

## 2024-04-16 VITALS — BP 139/82 | HR 62

## 2024-04-16 DIAGNOSIS — O10913 Unspecified pre-existing hypertension complicating pregnancy, third trimester: Secondary | ICD-10-CM | POA: Insufficient documentation

## 2024-04-16 DIAGNOSIS — Z3A35 35 weeks gestation of pregnancy: Secondary | ICD-10-CM | POA: Insufficient documentation

## 2024-04-16 DIAGNOSIS — Z3685 Encounter for antenatal screening for Streptococcus B: Secondary | ICD-10-CM | POA: Diagnosis not present

## 2024-04-16 DIAGNOSIS — Z3493 Encounter for supervision of normal pregnancy, unspecified, third trimester: Secondary | ICD-10-CM

## 2024-04-16 DIAGNOSIS — O09293 Supervision of pregnancy with other poor reproductive or obstetric history, third trimester: Secondary | ICD-10-CM | POA: Insufficient documentation

## 2024-04-16 DIAGNOSIS — O09523 Supervision of elderly multigravida, third trimester: Secondary | ICD-10-CM | POA: Diagnosis not present

## 2024-04-16 DIAGNOSIS — O10919 Unspecified pre-existing hypertension complicating pregnancy, unspecified trimester: Secondary | ICD-10-CM

## 2024-04-16 DIAGNOSIS — Z758 Other problems related to medical facilities and other health care: Secondary | ICD-10-CM | POA: Diagnosis not present

## 2024-04-16 DIAGNOSIS — O99013 Anemia complicating pregnancy, third trimester: Secondary | ICD-10-CM | POA: Insufficient documentation

## 2024-04-16 DIAGNOSIS — Z603 Acculturation difficulty: Secondary | ICD-10-CM

## 2024-04-16 DIAGNOSIS — D649 Anemia, unspecified: Secondary | ICD-10-CM

## 2024-04-16 DIAGNOSIS — O10013 Pre-existing essential hypertension complicating pregnancy, third trimester: Secondary | ICD-10-CM

## 2024-04-16 DIAGNOSIS — O09299 Supervision of pregnancy with other poor reproductive or obstetric history, unspecified trimester: Secondary | ICD-10-CM

## 2024-04-16 DIAGNOSIS — O0990 Supervision of high risk pregnancy, unspecified, unspecified trimester: Secondary | ICD-10-CM

## 2024-04-16 LAB — COMPREHENSIVE METABOLIC PANEL WITH GFR
ALT: 17 U/L (ref 0–44)
AST: 30 U/L (ref 15–41)
Albumin: 3.3 g/dL — ABNORMAL LOW (ref 3.5–5.0)
Alkaline Phosphatase: 188 U/L — ABNORMAL HIGH (ref 38–126)
Anion gap: 12 (ref 5–15)
BUN: 11 mg/dL (ref 6–20)
CO2: 19 mmol/L — ABNORMAL LOW (ref 22–32)
Calcium: 9 mg/dL (ref 8.9–10.3)
Chloride: 104 mmol/L (ref 98–111)
Creatinine, Ser: 0.78 mg/dL (ref 0.44–1.00)
GFR, Estimated: >60 mL/min
Glucose, Bld: 76 mg/dL (ref 70–99)
Potassium: 4.5 mmol/L (ref 3.5–5.1)
Sodium: 135 mmol/L (ref 135–145)
Total Bilirubin: 0.3 mg/dL (ref 0.0–1.2)
Total Protein: 6.5 g/dL (ref 6.5–8.1)

## 2024-04-16 LAB — WET PREP, GENITAL
Clue Cells Wet Prep HPF POC: NONE SEEN
Sperm: NONE SEEN
Trich, Wet Prep: NONE SEEN
WBC, Wet Prep HPF POC: >=10 — AB
Yeast Wet Prep HPF POC: NONE SEEN

## 2024-04-16 LAB — PROTEIN / CREATININE RATIO, URINE
Creatinine, Urine: 40 mg/dL
Total Protein, Urine: <6 mg/dL

## 2024-04-16 LAB — CBC
HCT: 37.7 % (ref 36.0–46.0)
Hemoglobin: 12.3 g/dL (ref 12.0–15.0)
MCH: 26.7 pg (ref 26.0–34.0)
MCHC: 32.6 g/dL (ref 30.0–36.0)
MCV: 81.8 fL (ref 80.0–100.0)
Platelets: 149 K/uL — ABNORMAL LOW (ref 150–400)
RBC: 4.61 MIL/uL (ref 3.87–5.11)
RDW: 15 % (ref 11.5–15.5)
WBC: 7.2 K/uL (ref 4.0–10.5)
nRBC: 0 % (ref 0.0–0.2)

## 2024-04-16 LAB — RUPTURE OF MEMBRANE (ROM)PLUS: Rom Plus: NEGATIVE

## 2024-04-16 LAB — GROUP B STREP BY PCR: Group B strep by PCR: POSITIVE — AB

## 2024-04-16 MED ORDER — NIFEDIPINE 10 MG PO CAPS: 10.0000 mg | ORAL_CAPSULE | ORAL | Status: DC | PRN

## 2024-04-16 MED ORDER — ACETAMINOPHEN-CAFFEINE 500-65 MG PO TABS
2.0000 | ORAL_TABLET | Freq: Once | ORAL | Status: AC
  Administered 2024-04-16: 2 via ORAL
  Filled 2024-04-16: qty 2

## 2024-04-16 MED ORDER — ACETAMINOPHEN-CAFFEINE 500-65 MG PO TABS: 2.0000 | ORAL_TABLET | Freq: Four times a day (QID) | ORAL | 0 refills | Status: AC | PRN

## 2024-04-16 MED ORDER — LABETALOL HCL 100 MG PO TABS
200.0000 mg | ORAL_TABLET | Freq: Once | ORAL | Status: AC
  Administered 2024-04-16: 200 mg via ORAL
  Filled 2024-04-16: qty 2

## 2024-04-16 MED ORDER — NIFEDIPINE 10 MG PO CAPS: 20.0000 mg | ORAL_CAPSULE | ORAL | Status: DC | PRN

## 2024-04-16 MED ORDER — LABETALOL HCL 5 MG/ML IV SOLN: 40.0000 mg | INTRAVENOUS | Status: DC | PRN

## 2024-04-16 NOTE — MAU Provider Note (Signed)
 Chief Complaint:  Rupture of Membranes   HPI   Gabrielle Park is a 44 y.o. [id number] at [redacted]w[redacted]d who presents to maternity admissions reporting that she was sent from the MFM office for evaluation of rupture of membranes.  Patient had an ultrasound today since she has been followed for growth restriction which has resolved.  Fetus now with estimated fetal weight in the 12th percentile.  AFI was subjectively low noted to be 6.63 cm with an MVP of 2.22 cm.  Upon further questioning patient mentions that she thought she was leaking fluid through her vagina for the past week or so and that her underwear has been wet all the time.  Therefore she was sent over for evaluation to rule out PROM.  Patient is also noted to be a chronic hypertensive on labetalol  200 mg twice daily and reports she did not take her medication this morning.  Her blood pressures here are in the mild range.  Patient denies any right upper quadrant pain, nausea vomiting, shortness of breath, right upper quadrant pain, chest pain, no  headache and states she did not eat except for this morning breakfast  Denies vaginal bleeding, contractions, and reports good fetal movements  She is also Spanish-speaking with a friend present who she is desiring to interpret for her although we offered her interpretive services.  Patient declined and signed the waiver for interpreter services  [facility]  Past Medical History:  Diagnosis Date   Abnormal cervical Papanicolaou smear    Anemia    History of severe pre-eclampsia 03/05/2016   Hypertension    OB History  Gravida Para Term Preterm AB Living  6 4 4  0 1 4  SAB IAB Ectopic Multiple Live Births  1 0 0 0 4    # Outcome Date GA Lbr Len/2nd Weight Sex Type Anes PTL Lv  6 Current           5 SAB 05/2023          4 Term 03/05/16 [redacted]w[redacted]d 00:57 / 00:01 2444 g M Vag-Spont None  LIV     Birth Comments: wnl  3 Term 06/24/09 [redacted]w[redacted]d  2495 g M Vag-Spont EPI N LIV   2 Term 10/01/04 [redacted]w[redacted]d  2892 g F Vag-Spont EPI N LIV  1 Term 10/09/01 [redacted]w[redacted]d  3232 g F Vag-Spont EPI N LIV   Past Surgical History:  Procedure Laterality Date   DILATION AND EVACUATION N/A 06/05/2023   Procedure: DILATATION AND EVACUATION;  Surgeon: Herchel Gloris LABOR, MD;  Location: [facility] OR;  Service: Gynecology;  Laterality: N/A;   OPERATIVE ULTRASOUND N/A 06/05/2023   Procedure: OPERATIVE ULTRASOUND;  Surgeon: Herchel Gloris LABOR, MD;  Location: [facility] OR;  Service: Gynecology;  Laterality: N/A;   Family History  Problem Relation Age of Onset   Hypertension Mother    Diabetes Mother    Healthy Father    Diabetes Maternal Aunt    Social History[1] Allergies[2] Medications Prior to Admission  Medication Sig Dispense Refill Last Dose/Taking   aspirin  EC 81 MG tablet Take 2 tablets (162 mg total) by mouth daily. Swallow whole. 60 tablet 12 04/15/2024   FEROSUL 325 (65 Fe) MG tablet Take 325 mg by mouth 2 (two) times daily.   Past Week   labetalol  (NORMODYNE ) 200 MG tablet Take 1 tablet (200 mg total) by mouth 2 (two) times daily. 60 tablet 3 04/15/2024   prenatal vitamin w/FE, FA (PRENATAL 1 + 1) 27-1 MG TABS  tablet Take 1 tablet by mouth daily at 12 noon. 30 tablet 6 04/15/2024   amoxicillin -clavulanate (AUGMENTIN ) 875-125 MG tablet Take 1 tablet by mouth 2 (two) times daily. 20 tablet 0     I have reviewed patient's Past Medical Hx, Surgical Hx, Family Hx, Social Hx, medications and allergies.   ROS  Pertinent items noted in HPI and remainder of comprehensive ROS otherwise negative.   PHYSICAL EXAM  Patient Vitals for the past 24 hrs:  BP Temp Temp src Pulse Resp SpO2 Weight  04/16/24 1630 117/74 -- -- 60 -- -- --  04/16/24 1615 (!) 135/90 -- -- (!) 57 -- -- --  04/16/24 1600 (!) 143/79 -- -- (!) 55 -- -- --  04/16/24 1545 (!) 133/90 -- -- (!) 51 -- -- --  04/16/24 1530 137/77 -- -- (!) 51 -- -- --  04/16/24 1515 (!) 142/75 -- -- (!) 53 -- -- --  04/16/24 1500 (!) 129/91 -- -- (!) 59 --  -- --  04/16/24 1445 (!) 148/89 -- -- (!) 58 -- -- --  04/16/24 1430 134/75 -- -- (!) 57 -- -- --  04/16/24 1415 (!) 143/91 -- -- (!) 59 -- -- --  04/16/24 1400 (!) 162/92 -- -- (!) 59 -- -- --  04/16/24 1342 (!) 156/92 98.5 F (36.9 C) Oral 66 18 100 % --  04/16/24 1334 -- -- -- -- -- -- 69.7 kg    Constitutional: Well-developed, well-nourished female in no acute distress.  Cardiovascular: Bradycardia, hypertensive,  warm and well-perfused Respiratory: normal effort, no problems with respiration noted GI: Abd soft, non-tender, no ruq pain illicited  MS: Extremities nontender, no edema, normal ROM Neurologic: Alert and oriented x 4.       Fetal Tracing: NST Reactive Baseline: 150 Variability: moderte  Accelerations: present Decelerations: absent Toco: UI   Labs: Results for orders placed or performed during the hospital encounter of 04/16/24 (from the past 24 hours)  Rupture of Membrane (ROM) Plus     Status: None   Collection Time: 04/16/24  3:14 PM  Result Value Ref Range   Rom Plus NEGATIVE   Wet prep, genital     Status: Abnormal   Collection Time: 04/16/24  3:14 PM  Result Value Ref Range   Yeast Wet Prep HPF POC NONE SEEN NONE SEEN   Trich, Wet Prep NONE SEEN NONE SEEN   Clue Cells Wet Prep HPF POC NONE SEEN NONE SEEN   WBC, Wet Prep HPF POC >=10 (A) <10   Sperm NONE SEEN   Group B strep by PCR     Status: Abnormal   Collection Time: 04/16/24  3:15 PM   Specimen: Genital  Result Value Ref Range   Group B strep by PCR POSITIVE (A) PRESUMPTIVE NEGATIVE  CBC     Status: Abnormal   Collection Time: 04/16/24  3:22 PM  Result Value Ref Range   WBC 7.2 4.0 - 10.5 K/uL   RBC 4.61 3.87 - 5.11 MIL/uL   Hemoglobin 12.3 12.0 - 15.0 g/dL   HCT 62.2 63.9 - 53.9 %   MCV 81.8 80.0 - 100.0 fL   MCH 26.7 26.0 - 34.0 pg   MCHC 32.6 30.0 - 36.0 g/dL   RDW 84.9 88.4 - 84.4 %   Platelets 149 (L) 150 - 400 K/uL   nRBC 0.0 0.0 - 0.2 %  Comprehensive metabolic panel     Status:  Abnormal   Collection Time: 04/16/24  3:22 PM  Result  Value Ref Range   Sodium 135 135 - 145 mmol/L   Potassium 4.5 3.5 - 5.1 mmol/L   Chloride 104 98 - 111 mmol/L   CO2 19 (L) 22 - 32 mmol/L   Glucose, Bld 76 70 - 99 mg/dL   BUN 11 6 - 20 mg/dL   Creatinine, Ser 9.21 0.44 - 1.00 mg/dL   Calcium 9.0 8.9 - 89.6 mg/dL   Total Protein 6.5 6.5 - 8.1 g/dL   Albumin 3.3 (L) 3.5 - 5.0 g/dL   AST 30 15 - 41 U/L   ALT 17 0 - 44 U/L   Alkaline Phosphatase 188 (H) 38 - 126 U/L   Total Bilirubin 0.3 0.0 - 1.2 mg/dL   GFR, Estimated >39 >39 mL/min   Anion gap 12 5 - 15  Protein / creatinine ratio, urine     Status: None   Collection Time: 04/16/24  3:23 PM  Result Value Ref Range   Creatinine, Urine 40 mg/dL   Total Protein, Urine <6 mg/dL   Protein Creatinine Ratio NOT CALCULATED <0.2 mg/mg    Imaging:  US  MFM OB FOLLOW UP Result Date: 04/16/2024 ----------------------------------------------------------------------  OBSTETRICS REPORT                       (Signed Final 04/16/2024 02:13 pm) ---------------------------------------------------------------------- Patient Info  ID #:       [id number]                          D.O.B.:  1980/06/10 (43 yrs)(F)  Name:       Gabrielle DEE-                 Visit Date: 04/16/2024 11:10 am              CASTRO ---------------------------------------------------------------------- Performed By  Attending:        Steffan Keys MD         Ref. Address:     [address]                                                             [place], [place]                                                             [address]  Performed By:     Erminio Gentry            [facility]                                     [facility]  Referred By:      [facility]  ---------------------------------------------------------------------- Orders  #  Description                           Code        Ordered By  1  US  MFM OB FOLLOW UP                   [id number]    YU FANG  2  US  MFM FETAL BPP WO NON               [id number]    YU FANG     STRESS ----------------------------------------------------------------------  #  Order #                     Accession #                Episode #  1  [id number]                   [id number]                 [id number]  2  [id number]                   [id number]                 [id number] ---------------------------------------------------------------------- Indications  Advanced maternal age multigravida 39+,        O23.523  third trimester (43)  Pre-existing essential hypertension            O10.013  complicating pregnancy, third trimester  (Labetalol )  Poor obstetric history: Previous fetal growth  O09.299  restriction (FGR)  Anemia during pregnancy in third trimester     O99.013  [redacted] weeks gestation of pregnancy                Z3A.35  LR NIPS - Female, Negative Horizon, Negative  AFP ---------------------------------------------------------------------- Fetal Evaluation  Num Of Fetuses:         1  Fetal Heart Rate(bpm):  153  Cardiac Activity:       Observed  Presentation:           Cephalic  Placenta:               Posterior  P. Cord Insertion:      Visualized, central  Amniotic Fluid  AFI FV:      Subjectively low-normal  AFI Sum(cm)     %Tile       Largest Pocket(cm)  6.63            < 3         2.22  RUQ(cm)       RLQ(cm)       LUQ(cm)        LLQ(cm)  0.89          2.22          1.78  1.74 ---------------------------------------------------------------------- Biophysical Evaluation  Amniotic F.V:   Within normal limits       F. Tone:        Observed  F. Movement:    Observed                   Score:          8/8  F. Breathing:   Observed ---------------------------------------------------------------------- Biometry  BPD:      84.3  mm      G. Age:  34w 0d         23  %    CI:        76.17   %    70 - 86                                                          FL/HC:      20.9   %    20.1 - 22.3  HC:      306.1  mm     G. Age:  34w 1d          6  %    HC/AC:      1.04        0.93 - 1.11  AC:      295.2  mm     G. Age:  33w 4d         17  %    FL/BPD:     75.9   %    71 - 87  FL:         64  mm     G. Age:  33w 0d          6  %    FL/AC:      21.7   %    20 - 24  HUM:      59.2  mm     G. Age:  34w 2d         52  %  LV:        3.9  mm  Est. FW:    2204  gm    4 lb 14 oz      12  %  Est. FW at 39 Wks:       2917  gm     6 lb 7 oz ---------------------------------------------------------------------- OB History  Blood Type:   O+  Gravidity:    6         Term:   4         SAB:   1  Living:       4 ---------------------------------------------------------------------- Gestational Age  LMP:           36w 1d        Date:  08/07/23                   EDD:   05/13/24  U/S Today:     33w 5d                                        EDD:   05/30/24  Best:          jerri 0d  Det. By:  Early Ultrasound         EDD:   05/21/24                                      (10/06/23) ---------------------------------------------------------------------- Anatomy  Cranium:               Previously seen        Aortic Arch:            Previously seen  Cavum:                 Previously seen        Ductal Arch:            Previously seen  Ventricles:            Appears normal         Diaphragm:              Appears normal  Choroid Plexus:        Previously seen        Stomach:                Appears normal, left                                                                        sided  Cerebellum:            Previously seen        Abdomen:                Previously seen  Posterior Fossa:       Previously seen        Abdominal Wall:         Previously seen  Face:                  Orbits and profile     Cord Vessels:           Previously seen                         previously seen  Lips:                   Previously seen        Kidneys:                Appear normal  Thoracic:              Previously seen        Bladder:                Appears normal  Heart:                 Previously seen        Spine:                  Previously seen  RVOT:                  Previously seen        Upper Extremities:      Previously seen  LVOT:  Previously seen        Lower Extremities:      Previously seen  Other:  Fetal anatomic survey complete on prior scans. ---------------------------------------------------------------------- Cervix Uterus Adnexa  Cervix  Not visualized (advanced GA >24wks) ---------------------------------------------------------------------- Comments  Sonographic findings  Single intrauterine pregnancy at 35w 0d.  Fetal cardiac activity: Observed.  Presentation: Cephalic.  Interval fetal anatomy appears normal.  Fetal biometry shows the estimated fetal weight of 4 lb 14 oz,  2204g (12%).  Amniotic fluid: Subjectively low-normal. AFI: 6.63 cm.  MVP:  2.22 cm.  Placenta: Posterior.  BPP: 8/8.  There are limitations of prenatal ultrasound such as the  inability to detect certain abnormalities due to poor  visualization. Various factors such as fetal position,  gestational age and maternal body habitus may increase the  difficulty in visualizing the fetal anatomy.  Recommendations  - See Epic note for assessment and plan of care. Any  referring office that does not utilize Epic will recieve a copy of  today's consult note via fax. Please contact our office with  any concerns. ----------------------------------------------------------------------                  Steffan Keys, MD Electronically Signed Final Report   04/16/2024 02:13 pm ----------------------------------------------------------------------   US  MFM FETAL BPP WO NON STRESS Result Date: 04/16/2024 ----------------------------------------------------------------------  OBSTETRICS REPORT                       (Signed Final  04/16/2024 02:13 pm) ---------------------------------------------------------------------- Patient Info  ID #:       [id number]                          D.O.B.:  09-13-1980 (43 yrs)(F)  Name:       Gabrielle DEE-                 Visit Date: 04/16/2024 11:10 am              CASTRO ---------------------------------------------------------------------- Performed By  Attending:        Steffan Keys MD         Ref. Address:     [address]                                                             [place], [place]                                                             [address]  Performed By:     Erminio Gentry            [facility]                                     [facility]  Referred By:      [facility] ---------------------------------------------------------------------- Orders  #  Description                           Code        Ordered By  1  US  MFM OB FOLLOW UP                   [id number]    YU FANG  2  US  MFM FETAL BPP WO NON               [id number]    YU FANG     STRESS ----------------------------------------------------------------------  #  Order #                     Accession #                Episode #  1  [id number]                   [id number]                 [id number]  2  [id number]                   [id number]                 [id number] ---------------------------------------------------------------------- Indications  Advanced maternal age multigravida 50+,        O37.523  third trimester (43)  Pre-existing essential hypertension            O10.013  complicating pregnancy, third trimester  (Labetalol )  Poor obstetric history: Previous fetal growth  O09.299  restriction (FGR)  Anemia during pregnancy in third trimester     O99.013  [redacted] weeks gestation of pregnancy                Z3A.35   LR NIPS - Female, Negative Horizon, Negative  AFP ---------------------------------------------------------------------- Fetal Evaluation  Num Of Fetuses:         1  Fetal Heart Rate(bpm):  153  Cardiac Activity:       Observed  Presentation:           Cephalic  Placenta:               Posterior  P. Cord Insertion:      Visualized, central  Amniotic Fluid  AFI FV:      Subjectively low-normal  AFI Sum(cm)     %Tile       Largest Pocket(cm)  6.63            < 3         2.22  RUQ(cm)       RLQ(cm)       LUQ(cm)        LLQ(cm)  0.89          2.22          1.78  1.74 ---------------------------------------------------------------------- Biophysical Evaluation  Amniotic F.V:   Within normal limits       F. Tone:        Observed  F. Movement:    Observed                   Score:          8/8  F. Breathing:   Observed ---------------------------------------------------------------------- Biometry  BPD:      84.3  mm     G. Age:  34w 0d         23  %    CI:        76.17   %    70 - 86                                                          FL/HC:      20.9   %    20.1 - 22.3  HC:      306.1  mm     G. Age:  34w 1d          6  %    HC/AC:      1.04        0.93 - 1.11  AC:      295.2  mm     G. Age:  33w 4d         17  %    FL/BPD:     75.9   %    71 - 87  FL:         64  mm     G. Age:  33w 0d          6  %    FL/AC:      21.7   %    20 - 24  HUM:      59.2  mm     G. Age:  34w 2d         52  %  LV:        3.9  mm  Est. FW:    2204  gm    4 lb 14 oz      12  %  Est. FW at 39 Wks:       2917  gm     6 lb 7 oz ---------------------------------------------------------------------- OB History  Blood Type:   O+  Gravidity:    6         Term:   4         SAB:   1  Living:       4 ---------------------------------------------------------------------- Gestational Age  LMP:           36w 1d        Date:  08/07/23                   EDD:   05/13/24  U/S Today:     33w 5d                                        EDD:   05/30/24  Best:           jerri 0d  Det. By:  Early Ultrasound         EDD:   05/21/24                                      (10/06/23) ---------------------------------------------------------------------- Anatomy  Cranium:               Previously seen        Aortic Arch:            Previously seen  Cavum:                 Previously seen        Ductal Arch:            Previously seen  Ventricles:            Appears normal         Diaphragm:              Appears normal  Choroid Plexus:        Previously seen        Stomach:                Appears normal, left                                                                        sided  Cerebellum:            Previously seen        Abdomen:                Previously seen  Posterior Fossa:       Previously seen        Abdominal Wall:         Previously seen  Face:                  Orbits and profile     Cord Vessels:           Previously seen                         previously seen  Lips:                  Previously seen        Kidneys:                Appear normal  Thoracic:              Previously seen        Bladder:                Appears normal  Heart:                 Previously seen        Spine:                  Previously seen  RVOT:                  Previously seen        Upper Extremities:      Previously seen  LVOT:  Previously seen        Lower Extremities:      Previously seen  Other:  Fetal anatomic survey complete on prior scans. ---------------------------------------------------------------------- Cervix Uterus Adnexa  Cervix  Not visualized (advanced GA >24wks) ---------------------------------------------------------------------- Comments  Sonographic findings  Single intrauterine pregnancy at 35w 0d.  Fetal cardiac activity: Observed.  Presentation: Cephalic.  Interval fetal anatomy appears normal.  Fetal biometry shows the estimated fetal weight of 4 lb 14 oz,  2204g (12%).  Amniotic fluid: Subjectively low-normal. AFI: 6.63 cm.  MVP:  2.22 cm.   Placenta: Posterior.  BPP: 8/8.  There are limitations of prenatal ultrasound such as the  inability to detect certain abnormalities due to poor  visualization. Various factors such as fetal position,  gestational age and maternal body habitus may increase the  difficulty in visualizing the fetal anatomy.  Recommendations  - See Epic note for assessment and plan of care. Any  referring office that does not utilize Epic will recieve a copy of  today's consult note via fax. Please contact our office with  any concerns. ----------------------------------------------------------------------                  Steffan Keys, MD Electronically Signed Final Report   04/16/2024 02:13 pm ----------------------------------------------------------------------    MDM & MAU COURSE  MDM:  MAU Course: Orders Placed This Encounter  Procedures   Wet prep, genital   Group B strep by PCR   Rupture of Membrane (ROM) Plus   CBC   Comprehensive metabolic panel   Protein / creatinine ratio, urine   Notify physician (specify) Confirmatory reading of BP> 160/110 15 minutes later   Apply Hypertensive Disorders of Pregnancy Care Plan   Vital signs   Measure blood pressure   Saline lock IV   Discharge patient Discharge disposition: 01-Home or Self Care; Discharge patient date: 04/16/2024   Meds ordered this encounter  Medications   AND Linked Order Group    NIFEdipine  (PROCARDIA ) capsule 10 mg    NIFEdipine  (PROCARDIA ) capsule 20 mg    NIFEdipine  (PROCARDIA ) capsule 20 mg    labetalol  (NORMODYNE ) injection 40 mg   labetalol  (NORMODYNE ) tablet 200 mg   acetaminophen -caffeine  (EXCEDRIN TENSION HEADACHE) 500-65 MG per tablet 2 tablet   acetaminophen -caffeine  (EXCEDRIN TENSION HEADACHE) 500-65 MG TABS per tablet    Sig: Take 2 tablets by mouth 4 (four) times daily as needed (For headaches).    Dispense:  60 tablet    Refill:  0    Supervising Provider:   PRATT, TANYA S [[id number]]     ASSESSMENT   1. Intact amniotic  membranes during pregnancy in third trimester   2. [redacted] weeks gestation of pregnancy   3. Chronic hypertension affecting pregnancy   4. Hx of intrauterine growth restriction in prior pregnancy, currently pregnant   5. Multigravida of advanced maternal age in third trimester   6. Language barrier     PLAN   Future Appointments  Date Time Provider Department Center  04/20/2024  8:55 AM [facility] US2 [facility]  04/23/2024  1:00 PM [facility] PROVIDER 1 [facility]  04/23/2024  1:15 PM [facility] NST [facility]  04/27/2024  8:55 AM [facility] US2 [facility]  04/28/2024  9:00 AM CENTERING PROVIDER [facility]  05/04/2024  8:55 AM [facility] US2 [facility]  05/11/2024  8:55 AM [facility] US2 [facility]  05/12/2024  9:00 AM CENTERING PROVIDER [facility]  05/26/2024  9:00 AM CENTERING PROVIDER [facility]     Discharge  home in stable condition with return precautions.   Patient was advised to continue labetalol  200 mg twice daily as prescribed and was given 1 dose here due to her elevated blood pressures since she had missed her morning dose  Patient reports she has an induction of labor scheduled on 05/14/2024 and has follow-up scheduled with MFM next week  See AVS for full description of information given to the patient including both verbal and written. Patient verbalized understanding and agrees with the plan as described above.      Allergies as of 04/16/2024   No Known Allergies      Medication List     TAKE these medications    acetaminophen -caffeine  500-65 MG Tabs per tablet Commonly known as: EXCEDRIN TENSION HEADACHE Take 2 tablets by mouth 4 (four) times daily as needed (For headaches).   amoxicillin -clavulanate 875-125 MG tablet Commonly known as: AUGMENTIN  Take 1 tablet by mouth 2 (two) times daily.   aspirin  EC 81 MG tablet Take 2 tablets (162 mg total) by mouth daily. Swallow whole.   FeroSul 325 (65 Fe) MG tablet Generic drug: ferrous sulfate Take 325 mg by mouth 2 (two) times  daily.   labetalol  200 MG tablet Commonly known as: NORMODYNE  Take 1 tablet (200 mg total) by mouth 2 (two) times daily.   prenatal vitamin w/FE, FA 27-1 MG Tabs tablet Take 1 tablet by mouth daily at 12 noon.        Olam Dalton, MSN, WHNP-BC [facility] [employer]       [1]  Social History Tobacco Use   Smoking status: Never   Smokeless tobacco: Never  Vaping Use   Vaping status: Never Used  Substance Use Topics   Alcohol use: Not Currently    Comment: socially   Drug use: No  [2] No Known Allergies

## 2024-04-16 NOTE — MAU Note (Signed)
 Gabrielle Park is a 44 y.o. at [redacted]w[redacted]d here in MAU reporting: she was sent from MFM office for evaluation of ROM.  States she had an ultrasound today and the fluid level was low.  Reports she has been leaking fluid since approximately last Wednesday.  Denies VB.  Reports +FM.  LMP: 08/07/2023 Onset of complaint: last week Pain score: 6 Vitals:   04/16/24 1342  BP: (!) 156/92  Pulse: 66  Resp: 18  Temp: 98.5 F (36.9 C)  SpO2: 100%     FHT: 155 bpm  Lab orders placed from triage: None

## 2024-04-16 NOTE — Progress Notes (Signed)
 MFM Consult Note  Gabrielle Park is currently at [redacted]w[redacted]d. She has been followed due to advanced maternal age (44 years old) and chronic hypertension currently treated with labetalol  200 mg twice a day.    The patient denies any problems since her last exam.  Her blood pressure today was 139/82.  Sonographic findings Single intrauterine pregnancy at 35w 0d. Fetal cardiac activity: Observed. Presentation: Cephalic. Interval fetal anatomy appears normal. Fetal biometry shows the estimated fetal weight of 4 lb 14 oz,  2204g (12%). Amniotic fluid: Subjectively low-normal. AFI: 6.63 cm.  MVP: 2.22 cm. Placenta: Posterior. BPP: 8/8.   Upon further questioning after the finding of low normal amniotic fluid, the patient reports that she has been leaking fluid through the vagina for the past week or so.  She reports that her underwear is wet all the time.  She was advised that based on the findings of today's ultrasound exam and her report of leaking fluid for the past week, there is a possibility that she may have ruptured membranes.    Due to possible rupture of membranes, she was sent to the MAU following today's ultrasound exam for evaluation.    Should rupture of membranes be confirmed, delivery is recommended at her current gestational age.    Should rupture of membranes be ruled out, she will return to our office in 1 week for a modified BPP and fluid check.  The patient stated that all of her questions were answered.    All conversations were held with the patient today with the help of a Spanish interpreter.  A total of 20 minutes was spent counseling and coordinating the care for this patient.  Greater than 50% of the time was spent in direct face-to-face contact.

## 2024-04-16 NOTE — MAU Note (Incomplete)
 Gabrielle Park

## 2024-04-19 LAB — GC/CHLAMYDIA PROBE AMP (~~LOC~~) NOT AT ARMC
Chlamydia: NEGATIVE
Comment: NEGATIVE
Comment: NORMAL
Neisseria Gonorrhea: NEGATIVE

## 2024-04-20 ENCOUNTER — Other Ambulatory Visit: Payer: Self-pay

## 2024-04-20 ENCOUNTER — Other Ambulatory Visit

## 2024-04-20 DIAGNOSIS — O09523 Supervision of elderly multigravida, third trimester: Secondary | ICD-10-CM

## 2024-04-20 DIAGNOSIS — O10919 Unspecified pre-existing hypertension complicating pregnancy, unspecified trimester: Secondary | ICD-10-CM

## 2024-04-23 ENCOUNTER — Other Ambulatory Visit

## 2024-04-27 ENCOUNTER — Other Ambulatory Visit

## 2024-04-28 ENCOUNTER — Other Ambulatory Visit (HOSPITAL_COMMUNITY)
Admission: RE | Admit: 2024-04-28 | Discharge: 2024-04-28 | Disposition: A | Source: Ambulatory Visit | Attending: Family Medicine | Admitting: Family Medicine

## 2024-04-28 ENCOUNTER — Ambulatory Visit: Payer: Self-pay | Admitting: Family Medicine

## 2024-04-28 ENCOUNTER — Other Ambulatory Visit: Payer: Self-pay

## 2024-04-28 VITALS — BP 150/90 | HR 80 | Wt 155.6 lb

## 2024-04-28 DIAGNOSIS — Z3A36 36 weeks gestation of pregnancy: Secondary | ICD-10-CM | POA: Insufficient documentation

## 2024-04-28 DIAGNOSIS — O0990 Supervision of high risk pregnancy, unspecified, unspecified trimester: Secondary | ICD-10-CM | POA: Diagnosis present

## 2024-04-28 DIAGNOSIS — Z2911 Encounter for prophylactic immunotherapy for respiratory syncytial virus (RSV): Secondary | ICD-10-CM | POA: Diagnosis not present

## 2024-04-28 DIAGNOSIS — O09523 Supervision of elderly multigravida, third trimester: Secondary | ICD-10-CM

## 2024-04-28 DIAGNOSIS — O09293 Supervision of pregnancy with other poor reproductive or obstetric history, third trimester: Secondary | ICD-10-CM | POA: Diagnosis not present

## 2024-04-28 DIAGNOSIS — H9122 Sudden idiopathic hearing loss, left ear: Secondary | ICD-10-CM | POA: Diagnosis not present

## 2024-04-28 DIAGNOSIS — O0993 Supervision of high risk pregnancy, unspecified, third trimester: Secondary | ICD-10-CM | POA: Diagnosis not present

## 2024-04-28 DIAGNOSIS — O09299 Supervision of pregnancy with other poor reproductive or obstetric history, unspecified trimester: Secondary | ICD-10-CM

## 2024-04-28 DIAGNOSIS — O10913 Unspecified pre-existing hypertension complicating pregnancy, third trimester: Secondary | ICD-10-CM | POA: Diagnosis not present

## 2024-04-28 DIAGNOSIS — O10919 Unspecified pre-existing hypertension complicating pregnancy, unspecified trimester: Secondary | ICD-10-CM

## 2024-04-28 NOTE — Progress Notes (Signed)
 "    PRENATAL VISIT NOTE: Centering Pregnancy Group 3D/3R, Session 10/2   Subjective:  Gabrielle Park is a 44 y.o. H3E5985 at [redacted]w[redacted]d being seen today for ongoing prenatal care.  She is currently monitored for the following issues for this high-risk pregnancy and has Supervision of high risk pregnancy, antepartum; Language barrier; Chronic hypertension affecting pregnancy; Hx of intrauterine growth restriction in prior pregnancy, currently pregnant; Hx of preeclampsia, prior pregnancy, currently pregnant; Advanced maternal age in multigravida; and Gestational thrombocytopenia without hemorrhage, third trimester on their problem list.  Patient reports does have increased pressure. Continues to have left hearing loss. .  Contractions: Not present. Vag. Bleeding: None.  Movement: Present. Denies leaking of fluid.   The following portions of the patient's history were reviewed and updated as appropriate: allergies, current medications, past family history, past medical history, past social history, past surgical history and problem list.   Objective:   Vitals:   04/28/24 0908 04/28/24 1001  BP: (!) 156/91 (!) 150/90  Pulse: 80   Weight: 155 lb 9.6 oz (70.6 kg)     Fetal Status:  Fetal Heart Rate (bpm): 140 Fundal Height: 36 cm Movement: Present Presentation: Vertex  General: Alert, oriented and cooperative. Patient is in no acute distress.  Skin: Skin is warm and dry. No rash noted.   Cardiovascular: Normal heart rate noted  Respiratory: Normal respiratory effort, no problems with respiration noted  Abdomen: Soft, gravid, appropriate for gestational age.  Pain/Pressure: Present     Pelvic: Cervical exam deferred        Extremities: Normal range of motion.     Mental Status: Normal mood and affect. Normal behavior. Normal judgment and thought content.      03/17/2024   11:14 AM 11/27/2023   10:31 AM 06/12/2023    2:29 PM  Depression screen PHQ 2/9  Decreased Interest 0 0 0  Down,  Depressed, Hopeless 0 0 0  PHQ - 2 Score 0 0 0  Altered sleeping 0 0 0  Tired, decreased energy 0 0 0  Change in appetite 0 0 0  Feeling bad or failure about yourself  0 0 0  Trouble concentrating 0 0 0  Moving slowly or fidgety/restless 0 0 0  Suicidal thoughts 0 0 0  PHQ-9 Score 0 0  0   Difficult doing work/chores Not difficult at all       Data saved with a previous flowsheet row definition        03/17/2024   11:14 AM 11/27/2023   10:31 AM 06/12/2023    2:29 PM 06/04/2023   11:27 AM  GAD 7 : Generalized Anxiety Score  Nervous, Anxious, on Edge 0  0  0  0   Control/stop worrying 0  0  0  0   Worry too much - different things 0  0  0  0   Trouble relaxing 0  0  0  0   Restless 0  0  0  0   Easily annoyed or irritable 0  0  0  0   Afraid - awful might happen 0  0  0  0   Total GAD 7 Score 0 0 0 0  Anxiety Difficulty Not difficult at all        Data saved with a previous flowsheet row definition    Assessment and Plan:  Pregnancy: H3E5985 at [redacted]w[redacted]d  1. Supervision of high risk pregnancy, antepartum (Primary)  Centering Pregnancy, Session#2: Reviewed rules  for self-governance with group. Direct group to [employer].   Facilitated discussion today: Common discomforts  Mindfulness activity completed   Fundal height and FHR appropriate today unless noted otherwise in plan. Patient to continue group care.   Desires RSV today Orders Placed This Encounter  Procedures   Strep Gp B NAA   Respiratory syncytial virus vaccine, preF, subunit, bivalent,(Abrysvo)   Fetal nonstress test   2. Hx of preeclampsia, prior pregnancy, currently pregnant No s/sx of PEC  3. Chronic hypertension affecting pregnancy BP elevated but did not take her medications today Instructed to go home take and then check BP after IOL at 39 weeks Missed BPP, she reports US  did not get confirmed.  Needs NST today and then hopefully BPP this week   4. Multigravida of advanced maternal age in  third trimester  5. Hx of intrauterine growth restriction in prior pregnancy, currently pregnant 1/9 EFW was 2204g 12th%@35  weeks   Preterm labor symptoms and general obstetric precautions including but not limited to vaginal bleeding, contractions, leaking of fluid and fetal movement were reviewed in detail with the patient. Please refer to After Visit Summary for other counseling recommendations.   Return in about 1 week (around 05/05/2024) for Routine prenatal care.  Future Appointments  Date Time Provider Department Center  05/11/2024  8:55 AM [facility] US2 [facility]  05/12/2024  9:00 AM CENTERING PROVIDER [facility]  05/14/2024  7:15 AM MC-LD SCHED ROOM [facility] None  05/26/2024  9:00 AM CENTERING PROVIDER [facility]    Suzen Maryan Masters, MD "

## 2024-04-29 LAB — GC/CHLAMYDIA PROBE AMP (~~LOC~~) NOT AT ARMC
Chlamydia: NEGATIVE
Comment: NEGATIVE
Comment: NORMAL
Neisseria Gonorrhea: NEGATIVE

## 2024-04-30 ENCOUNTER — Ambulatory Visit: Payer: Self-pay | Admitting: Family Medicine

## 2024-04-30 LAB — STREP GP B NAA: Strep Gp B NAA: POSITIVE — AB

## 2024-05-04 ENCOUNTER — Other Ambulatory Visit

## 2024-05-05 ENCOUNTER — Ambulatory Visit

## 2024-05-05 ENCOUNTER — Ambulatory Visit (INDEPENDENT_AMBULATORY_CARE_PROVIDER_SITE_OTHER): Payer: Self-pay | Admitting: Family Medicine

## 2024-05-05 ENCOUNTER — Other Ambulatory Visit: Payer: Self-pay

## 2024-05-05 VITALS — BP 153/96 | HR 61 | Wt 155.6 lb

## 2024-05-05 DIAGNOSIS — O0993 Supervision of high risk pregnancy, unspecified, third trimester: Secondary | ICD-10-CM

## 2024-05-05 DIAGNOSIS — O09523 Supervision of elderly multigravida, third trimester: Secondary | ICD-10-CM

## 2024-05-05 DIAGNOSIS — O0990 Supervision of high risk pregnancy, unspecified, unspecified trimester: Secondary | ICD-10-CM

## 2024-05-05 DIAGNOSIS — O09293 Supervision of pregnancy with other poor reproductive or obstetric history, third trimester: Secondary | ICD-10-CM

## 2024-05-05 DIAGNOSIS — O10919 Unspecified pre-existing hypertension complicating pregnancy, unspecified trimester: Secondary | ICD-10-CM

## 2024-05-05 DIAGNOSIS — Z603 Acculturation difficulty: Secondary | ICD-10-CM

## 2024-05-05 DIAGNOSIS — O09299 Supervision of pregnancy with other poor reproductive or obstetric history, unspecified trimester: Secondary | ICD-10-CM

## 2024-05-05 DIAGNOSIS — O10913 Unspecified pre-existing hypertension complicating pregnancy, third trimester: Secondary | ICD-10-CM

## 2024-05-05 DIAGNOSIS — Z3A37 37 weeks gestation of pregnancy: Secondary | ICD-10-CM

## 2024-05-05 DIAGNOSIS — H9193 Unspecified hearing loss, bilateral: Secondary | ICD-10-CM | POA: Diagnosis not present

## 2024-05-05 DIAGNOSIS — Z758 Other problems related to medical facilities and other health care: Secondary | ICD-10-CM

## 2024-05-05 LAB — COMPREHENSIVE METABOLIC PANEL WITH GFR
ALT: 21 [IU]/L (ref 0–32)
AST: 30 [IU]/L (ref 0–40)
Albumin: 3.4 g/dL — ABNORMAL LOW (ref 3.9–4.9)
Alkaline Phosphatase: 213 [IU]/L — ABNORMAL HIGH (ref 41–116)
BUN/Creatinine Ratio: 19 (ref 9–23)
BUN: 14 mg/dL (ref 6–24)
Bilirubin Total: 0.2 mg/dL (ref 0.0–1.2)
CO2: 16 mmol/L — ABNORMAL LOW (ref 20–29)
Calcium: 8.5 mg/dL — ABNORMAL LOW (ref 8.7–10.2)
Chloride: 104 mmol/L (ref 96–106)
Creatinine, Ser: 0.74 mg/dL (ref 0.57–1.00)
Globulin, Total: 2.5 g/dL (ref 1.5–4.5)
Glucose: 101 mg/dL — ABNORMAL HIGH (ref 70–99)
Potassium: 4.5 mmol/L (ref 3.5–5.2)
Sodium: 135 mmol/L (ref 134–144)
Total Protein: 5.9 g/dL — ABNORMAL LOW (ref 6.0–8.5)
eGFR: 104 mL/min/{1.73_m2}

## 2024-05-05 LAB — CBC
Hematocrit: 39.3 % (ref 34.0–46.6)
Hemoglobin: 12.8 g/dL (ref 11.1–15.9)
MCH: 27.1 pg (ref 26.6–33.0)
MCHC: 32.6 g/dL (ref 31.5–35.7)
MCV: 83 fL (ref 79–97)
Platelets: 120 10*3/uL — ABNORMAL LOW (ref 150–450)
RBC: 4.73 x10E6/uL (ref 3.77–5.28)
RDW: 14.7 % (ref 11.7–15.4)
WBC: 7.4 10*3/uL (ref 3.4–10.8)

## 2024-05-05 NOTE — Progress Notes (Signed)
 "  PRENATAL VISIT NOTE  Subjective:  Gabrielle Park is a 44 y.o. H3E5985 at [redacted]w[redacted]d being seen today for ongoing prenatal care.  She is currently monitored for the following issues for this high-risk pregnancy and has Supervision of high risk pregnancy, antepartum; Language barrier; Chronic hypertension affecting pregnancy; Hx of intrauterine growth restriction in prior pregnancy, currently pregnant; Hx of preeclampsia, prior pregnancy, currently pregnant; and Advanced maternal age in multigravida on their problem list.  Patient reports no complaints- continues to have hearing loss L>R. She DID NOT TAKE MEDS today. Contractions: Not present. Vag. Bleeding: None.  Movement: Present. Denies leaking of fluid.   The following portions of the patient's history were reviewed and updated as appropriate: allergies, current medications, past family history, past medical history, past social history, past surgical history and problem list.   Objective:   Vitals:   05/05/24 1102 05/05/24 1123  BP: (!) 162/91 (!) 153/96  Pulse: 63 61  Weight: 155 lb 9.6 oz (70.6 kg)     Fetal Status:  Fetal Heart Rate (bpm): 154 Fundal Height: 38 cm Movement: Present Presentation: Vertex  General: Alert, oriented and cooperative. Patient is in no acute distress.  Skin: Skin is warm and dry. No rash noted.   Cardiovascular: Normal heart rate noted  Respiratory: Normal respiratory effort, no problems with respiration noted  Abdomen: Soft, gravid, appropriate for gestational age.  Pain/Pressure: Present (pressure)     Pelvic: Cervical exam deferred        Extremities: Normal range of motion.  Edema: Trace (hands)  Mental Status: Normal mood and affect. Normal behavior. Normal judgment and thought content.      03/17/2024   11:14 AM 11/27/2023   10:31 AM 06/12/2023    2:29 PM  Depression screen PHQ 2/9  Decreased Interest 0 0 0  Down, Depressed, Hopeless 0 0 0  PHQ - 2 Score 0 0 0  Altered sleeping 0 0 0   Tired, decreased energy 0 0 0  Change in appetite 0 0 0  Feeling bad or failure about yourself  0 0 0  Trouble concentrating 0 0 0  Moving slowly or fidgety/restless 0 0 0  Suicidal thoughts 0 0 0  PHQ-9 Score 0 0  0   Difficult doing work/chores Not difficult at all       Data saved with a previous flowsheet row definition        03/17/2024   11:14 AM 11/27/2023   10:31 AM 06/12/2023    2:29 PM 06/04/2023   11:27 AM  GAD 7 : Generalized Anxiety Score  Nervous, Anxious, on Edge 0  0  0  0   Control/stop worrying 0  0  0  0   Worry too much - different things 0  0  0  0   Trouble relaxing 0  0  0  0   Restless 0  0  0  0   Easily annoyed or irritable 0  0  0  0   Afraid - awful might happen 0  0  0  0   Total GAD 7 Score 0 0 0 0  Anxiety Difficulty Not difficult at all        Data saved with a previous flowsheet row definition    Assessment and Plan:  Pregnancy: H3E5985 at [redacted]w[redacted]d 1. Supervision of high risk pregnancy, antepartum (Primary) Up to date Vigorous movement FH appropriate Desires BTS- signed consent on 12/10  Concerns- hearing, referral to  ENT placed  2. Hx of preeclampsia, prior pregnancy, currently pregnant BP elevated today-- did not take labetalol  On ASA  3. Multigravida of advanced maternal age in third trimester LR NIP  4. Chronic hypertension affecting pregnancy On ASA and Labetalol  but DID NOT TAKE LABETALOL  TODAY---  Recommended taking labetalol  at home and then 54min-1hr later check BP at home and confirmed she has a cuff at home. If > 140/90 needs to go to MAU PEC labs today given elevated BP but again this is expected given she did not take meds today IOL needed at 39 weeks-- desires 2/6 -- scheduled and orders in signed and held  5. Language barrier Spanish  6. Hx of intrauterine growth restriction in prior pregnancy, currently pregnant EFW at last growth was 1/9  Est. FW:    2204  gm    4 lb 14 oz      12  %  Est. FW at 39 Wks:        2917  gm     6 lb 7 oz  Estimated Date of Delivery: 05/21/24  Preterm labor symptoms and general obstetric precautions including but not limited to vaginal bleeding, contractions, leaking of fluid and fetal movement were reviewed in detail with the patient. Please refer to After Visit Summary for other counseling recommendations.   Return in about 1 week (around 05/12/2024) for BPP (please schedule a after Centering, OK if slightly before the end at 1055) if possible.  Future Appointments  Date Time Provider Department Center  05/11/2024  8:55 AM [facility] US2 [facility]  05/12/2024  9:00 AM CENTERING PROVIDER [facility]  05/14/2024  7:15 AM MC-LD SCHED ROOM [facility] None  05/26/2024  9:00 AM CENTERING PROVIDER [facility]    Suzen Maryan Masters, MD "

## 2024-05-06 ENCOUNTER — Ambulatory Visit: Payer: Self-pay | Admitting: Family Medicine

## 2024-05-06 DIAGNOSIS — D696 Thrombocytopenia, unspecified: Secondary | ICD-10-CM | POA: Insufficient documentation

## 2024-05-06 LAB — PROTEIN / CREATININE RATIO, URINE
Creatinine, Urine: 27.6 mg/dL
Protein, Ur: <4 mg/dL

## 2024-05-07 ENCOUNTER — Telehealth (HOSPITAL_COMMUNITY): Payer: Self-pay | Admitting: *Deleted

## 2024-05-07 ENCOUNTER — Encounter (HOSPITAL_COMMUNITY): Payer: Self-pay | Admitting: *Deleted

## 2024-05-07 NOTE — Telephone Encounter (Signed)
 [phone] interpreter number  Preadmission screen

## 2024-05-11 ENCOUNTER — Other Ambulatory Visit

## 2024-05-12 ENCOUNTER — Ambulatory Visit: Admitting: Family Medicine

## 2024-05-12 ENCOUNTER — Other Ambulatory Visit: Payer: Self-pay

## 2024-05-12 VITALS — BP 158/82 | HR 53 | Wt 156.6 lb

## 2024-05-12 DIAGNOSIS — D696 Thrombocytopenia, unspecified: Secondary | ICD-10-CM

## 2024-05-12 DIAGNOSIS — O0990 Supervision of high risk pregnancy, unspecified, unspecified trimester: Secondary | ICD-10-CM

## 2024-05-12 DIAGNOSIS — O09299 Supervision of pregnancy with other poor reproductive or obstetric history, unspecified trimester: Secondary | ICD-10-CM

## 2024-05-12 DIAGNOSIS — O09523 Supervision of elderly multigravida, third trimester: Secondary | ICD-10-CM

## 2024-05-12 DIAGNOSIS — O10919 Unspecified pre-existing hypertension complicating pregnancy, unspecified trimester: Secondary | ICD-10-CM

## 2024-05-12 DIAGNOSIS — Z3A38 38 weeks gestation of pregnancy: Secondary | ICD-10-CM

## 2024-05-12 NOTE — Progress Notes (Unsigned)
 "  PRENATAL VISIT NOTE  Subjective:  Gabrielle Park is a 44 y.o. H3E5985 at [redacted]w[redacted]d being seen today for ongoing prenatal care.  She is currently monitored for the following issues for this high-risk pregnancy and has Supervision of high risk pregnancy, antepartum; Language barrier; Chronic hypertension affecting pregnancy; Hx of intrauterine growth restriction in prior pregnancy, currently pregnant; Hx of preeclampsia, prior pregnancy, currently pregnant; Advanced maternal age in multigravida; and Gestational thrombocytopenia without hemorrhage, third trimester on their problem list.  Patient reports no complaints.   .  .   . Denies leaking of fluid.   The following portions of the patient's history were reviewed and updated as appropriate: allergies, current medications, past family history, past medical history, past social history, past surgical history and problem list.   Objective:   Vitals:   05/12/24 0856  BP: (!) 155/91  Pulse: (!) 55  Weight: 156 lb 9.6 oz (71 kg)    Fetal Status:           General: Alert, oriented and cooperative. Patient is in no acute distress.  Skin: Skin is warm and dry. No rash noted.   Cardiovascular: Normal heart rate noted  Respiratory: Normal respiratory effort, no problems with respiration noted  Abdomen: Soft, gravid, appropriate for gestational age.        Pelvic: Cervical exam deferred        Extremities: Normal range of motion.     Mental Status: Normal mood and affect. Normal behavior. Normal judgment and thought content.      03/17/2024   11:14 AM 11/27/2023   10:31 AM 06/12/2023    2:29 PM  Depression screen PHQ 2/9  Decreased Interest 0 0 0  Down, Depressed, Hopeless 0 0 0  PHQ - 2 Score 0 0 0  Altered sleeping 0 0 0  Tired, decreased energy 0 0 0  Change in appetite 0 0 0  Feeling bad or failure about yourself  0 0 0  Trouble concentrating 0 0 0  Moving slowly or fidgety/restless 0 0 0  Suicidal thoughts 0 0 0  PHQ-9 Score  0 0  0   Difficult doing work/chores Not difficult at all       Data saved with a previous flowsheet row definition        03/17/2024   11:14 AM 11/27/2023   10:31 AM 06/12/2023    2:29 PM 06/04/2023   11:27 AM  GAD 7 : Generalized Anxiety Score  Nervous, Anxious, on Edge 0  0  0  0   Control/stop worrying 0  0  0  0   Worry too much - different things 0  0  0  0   Trouble relaxing 0  0  0  0   Restless 0  0  0  0   Easily annoyed or irritable 0  0  0  0   Afraid - awful might happen 0  0  0  0   Total GAD 7 Score 0 0 0 0  Anxiety Difficulty Not difficult at all        Data saved with a previous flowsheet row definition    Assessment and Plan:  Pregnancy: H3E5985 at [redacted]w[redacted]d 1. Chronic hypertension affecting pregnancy (Primary) ***  2. Gestational thrombocytopenia without hemorrhage, third trimester ***  3. Multigravida of advanced maternal age in third trimester ***  4. Hx of intrauterine growth restriction in prior pregnancy, currently pregnant ***  5. Hx of preeclampsia,  prior pregnancy, currently pregnant ***  6. Supervision of high risk pregnancy, antepartum ***  7. [redacted] weeks gestation of pregnancy ***  Preterm labor symptoms and general obstetric precautions including but not limited to vaginal bleeding, contractions, leaking of fluid and fetal movement were reviewed in detail with the patient. Please refer to After Visit Summary for other counseling recommendations.   Return in about 2 days (around 05/14/2024) for Induction.  Future Appointments  Date Time Provider Department Center  05/14/2024  7:15 AM MC-LD SCHED ROOM [facility] None  05/25/2024  8:30 AM Palmer Purchase, PA-C [facility] None  05/26/2024  9:00 AM CENTERING PROVIDER [facility]    Suzen Maryan Masters, MD "

## 2024-05-14 ENCOUNTER — Inpatient Hospital Stay (HOSPITAL_COMMUNITY): Admitting: Anesthesiology

## 2024-05-14 ENCOUNTER — Inpatient Hospital Stay (HOSPITAL_COMMUNITY)

## 2024-05-14 ENCOUNTER — Encounter (HOSPITAL_COMMUNITY): Payer: Self-pay | Admitting: Family Medicine

## 2024-05-14 ENCOUNTER — Other Ambulatory Visit: Payer: Self-pay

## 2024-05-14 ENCOUNTER — Inpatient Hospital Stay (HOSPITAL_COMMUNITY)
Admission: AD | Admit: 2024-05-14 | Source: Home / Self Care | Attending: Obstetrics and Gynecology | Admitting: Obstetrics and Gynecology

## 2024-05-14 ENCOUNTER — Encounter (HOSPITAL_COMMUNITY): Admission: AD | Payer: Self-pay | Source: Home / Self Care | Attending: Obstetrics and Gynecology

## 2024-05-14 DIAGNOSIS — O1410 Severe pre-eclampsia, unspecified trimester: Secondary | ICD-10-CM | POA: Diagnosis present

## 2024-05-14 DIAGNOSIS — O10919 Unspecified pre-existing hypertension complicating pregnancy, unspecified trimester: Secondary | ICD-10-CM | POA: Diagnosis present

## 2024-05-14 LAB — CBC
HCT: 38.7 % (ref 36.0–46.0)
HCT: 37 % (ref 36.0–46.0)
Hemoglobin: 12.7 g/dL (ref 12.0–15.0)
Hemoglobin: 12.1 g/dL (ref 12.0–15.0)
MCH: 27.1 pg (ref 26.0–34.0)
MCH: 27.1 pg (ref 26.0–34.0)
MCHC: 32.8 g/dL (ref 30.0–36.0)
MCHC: 32.7 g/dL (ref 30.0–36.0)
MCV: 82.5 fL (ref 80.0–100.0)
MCV: 83 fL (ref 80.0–100.0)
Platelets: 110 10*3/uL — ABNORMAL LOW (ref 150–400)
Platelets: 110 10*3/uL — ABNORMAL LOW (ref 150–400)
RBC: 4.69 MIL/uL (ref 3.87–5.11)
RBC: 4.46 MIL/uL (ref 3.87–5.11)
RDW: 16.4 % — ABNORMAL HIGH (ref 11.5–15.5)
RDW: 16.1 % — ABNORMAL HIGH (ref 11.5–15.5)
WBC: 8.2 10*3/uL (ref 4.0–10.5)
WBC: 13.9 10*3/uL — ABNORMAL HIGH (ref 4.0–10.5)
nRBC: 0 % (ref 0.0–0.2)
nRBC: 0 % (ref 0.0–0.2)

## 2024-05-14 LAB — COMPREHENSIVE METABOLIC PANEL WITH GFR
ALT: 21 U/L (ref 0–44)
ALT: 22 U/L (ref 0–44)
AST: 33 U/L (ref 15–41)
AST: 38 U/L (ref 15–41)
Albumin: 3.6 g/dL (ref 3.5–5.0)
Albumin: 3.4 g/dL — ABNORMAL LOW (ref 3.5–5.0)
Alkaline Phosphatase: 226 U/L — ABNORMAL HIGH (ref 38–126)
Alkaline Phosphatase: 227 U/L — ABNORMAL HIGH (ref 38–126)
Anion gap: 11 (ref 5–15)
Anion gap: 12 (ref 5–15)
BUN: 10 mg/dL (ref 6–20)
BUN: 9 mg/dL (ref 6–20)
CO2: 19 mmol/L — ABNORMAL LOW (ref 22–32)
CO2: 18 mmol/L — ABNORMAL LOW (ref 22–32)
Calcium: 8.6 mg/dL — ABNORMAL LOW (ref 8.9–10.3)
Calcium: 8.2 mg/dL — ABNORMAL LOW (ref 8.9–10.3)
Chloride: 106 mmol/L (ref 98–111)
Chloride: 104 mmol/L (ref 98–111)
Creatinine, Ser: 0.72 mg/dL (ref 0.44–1.00)
Creatinine, Ser: 0.77 mg/dL (ref 0.44–1.00)
GFR, Estimated: >60 mL/min
GFR, Estimated: >60 mL/min
Glucose, Bld: 81 mg/dL (ref 70–99)
Glucose, Bld: 97 mg/dL (ref 70–99)
Potassium: 4.1 mmol/L (ref 3.5–5.1)
Potassium: 4.5 mmol/L (ref 3.5–5.1)
Sodium: 136 mmol/L (ref 135–145)
Sodium: 133 mmol/L — ABNORMAL LOW (ref 135–145)
Total Bilirubin: 0.4 mg/dL (ref 0.0–1.2)
Total Bilirubin: 0.3 mg/dL (ref 0.0–1.2)
Total Protein: 6.7 g/dL (ref 6.5–8.1)
Total Protein: 6.5 g/dL (ref 6.5–8.1)

## 2024-05-14 LAB — CBC WITH DIFFERENTIAL/PLATELET
Abs Immature Granulocytes: 0.04 10*3/uL (ref 0.00–0.07)
Basophils Absolute: 0 10*3/uL (ref 0.0–0.1)
Basophils Relative: 0 %
Eosinophils Absolute: 0.3 10*3/uL (ref 0.0–0.5)
Eosinophils Relative: 3 %
HCT: 38.3 % (ref 36.0–46.0)
Hemoglobin: 12.6 g/dL (ref 12.0–15.0)
Immature Granulocytes: 1 %
Lymphocytes Relative: 22 %
Lymphs Abs: 1.9 10*3/uL (ref 0.7–4.0)
MCH: 27.2 pg (ref 26.0–34.0)
MCHC: 32.9 g/dL (ref 30.0–36.0)
MCV: 82.7 fL (ref 80.0–100.0)
Monocytes Absolute: 0.5 10*3/uL (ref 0.1–1.0)
Monocytes Relative: 5 %
Neutro Abs: 6 10*3/uL (ref 1.7–7.7)
Neutrophils Relative %: 69 %
Platelets: 97 10*3/uL — ABNORMAL LOW (ref 150–400)
RBC: 4.63 MIL/uL (ref 3.87–5.11)
RDW: 16.2 % — ABNORMAL HIGH (ref 11.5–15.5)
WBC: 8.7 10*3/uL (ref 4.0–10.5)
nRBC: 0 % (ref 0.0–0.2)

## 2024-05-14 LAB — TYPE AND SCREEN
ABO/RH(D): O POS
Antibody Screen: NEGATIVE

## 2024-05-14 LAB — PROTEIN / CREATININE RATIO, URINE
Creatinine, Urine: 45 mg/dL
Protein Creatinine Ratio: 0.1 mg/mg
Total Protein, Urine: 6 mg/dL

## 2024-05-14 LAB — SYPHILIS: RPR W/REFLEX TO RPR TITER AND TREPONEMAL ANTIBODIES, TRADITIONAL SCREENING AND DIAGNOSIS ALGORITHM: RPR Ser Ql: NONREACTIVE

## 2024-05-14 MED ORDER — KETOROLAC TROMETHAMINE 30 MG/ML IJ SOLN
30.0000 mg | Freq: Four times a day (QID) | INTRAMUSCULAR | Status: AC
  Administered 2024-05-14: 30 mg via INTRAVENOUS
  Filled 2024-05-14: qty 1

## 2024-05-14 MED ORDER — EPHEDRINE 5 MG/ML INJ: 10.0000 mg | INTRAVENOUS | Status: DC | PRN

## 2024-05-14 MED ORDER — SIMETHICONE 80 MG PO CHEW: 80.0000 mg | CHEWABLE_TABLET | ORAL | Status: AC | PRN

## 2024-05-14 MED ORDER — SENNOSIDES-DOCUSATE SODIUM 8.6-50 MG PO TABS: 2.0000 | ORAL_TABLET | Freq: Every day | ORAL | Status: AC

## 2024-05-14 MED ORDER — OXYTOCIN BOLUS FROM INFUSION
333.0000 mL | Freq: Once | INTRAVENOUS | Status: AC
  Administered 2024-05-14: 333 mL via INTRAVENOUS

## 2024-05-14 MED ORDER — METHYLERGONOVINE MALEATE 0.2 MG/ML IJ SOLN
INTRAMUSCULAR | Status: AC
  Filled 2024-05-14: qty 1

## 2024-05-14 MED ORDER — ACETAMINOPHEN 325 MG PO TABS: 650.0000 mg | ORAL_TABLET | ORAL | Status: AC | PRN

## 2024-05-14 MED ORDER — MAGNESIUM SULFATE 40 GM/1000ML IV SOLN: 2.0000 g/h | INTRAVENOUS | Status: AC

## 2024-05-14 MED ORDER — PRENATAL MULTIVITAMIN CH: 1.0000 | ORAL_TABLET | Freq: Every day | ORAL | Status: AC

## 2024-05-14 MED ORDER — MAGNESIUM SULFATE BOLUS VIA INFUSION
4.0000 g | Freq: Once | INTRAVENOUS | Status: AC
  Filled 2024-05-14: qty 1000

## 2024-05-14 MED ORDER — IBUPROFEN 600 MG PO TABS: 600.0000 mg | ORAL_TABLET | Freq: Four times a day (QID) | ORAL | Status: AC

## 2024-05-14 MED ORDER — KETOROLAC TROMETHAMINE 30 MG/ML IJ SOLN: 30.0000 mg | Freq: Once | INTRAMUSCULAR | Status: DC

## 2024-05-14 MED ORDER — PHENYLEPHRINE 80 MCG/ML (10ML) SYRINGE FOR IV PUSH (FOR BLOOD PRESSURE SUPPORT): 80.0000 ug | PREFILLED_SYRINGE | INTRAVENOUS | Status: DC | PRN

## 2024-05-14 MED ORDER — LABETALOL HCL 5 MG/ML IV SOLN: 40.0000 mg | INTRAVENOUS | Status: AC | PRN

## 2024-05-14 MED ORDER — OXYCODONE HCL 5 MG PO TABS
5.0000 mg | ORAL_TABLET | ORAL | Status: AC | PRN
  Administered 2024-05-14: 5 mg via ORAL
  Filled 2024-05-14: qty 1

## 2024-05-14 MED ORDER — LACTATED RINGERS AMNIOINFUSION: INTRAVENOUS | Status: DC

## 2024-05-14 MED ORDER — MISOPROSTOL 25 MCG QUARTER TABLET
25.0000 ug | ORAL_TABLET | Freq: Once | ORAL | Status: AC
  Administered 2024-05-14: 25 ug via VAGINAL
  Filled 2024-05-14: qty 1

## 2024-05-14 MED ORDER — TERBUTALINE SULFATE 1 MG/ML IJ SOLN: 0.2500 mg | Freq: Once | INTRAMUSCULAR | Status: DC | PRN

## 2024-05-14 MED ORDER — MIDAZOLAM HCL 5 MG/5ML IJ SOLN
INTRAMUSCULAR | Status: DC | PRN
  Administered 2024-05-14: 1 mg via INTRAVENOUS

## 2024-05-14 MED ORDER — LABETALOL HCL 5 MG/ML IV SOLN: 80.0000 mg | INTRAVENOUS | Status: AC | PRN

## 2024-05-14 MED ORDER — TRANEXAMIC ACID-NACL 1000-0.7 MG/100ML-% IV SOLN
INTRAVENOUS | Status: AC
  Administered 2024-05-14: 1000 mg
  Filled 2024-05-14: qty 100

## 2024-05-14 MED ORDER — OXYCODONE-ACETAMINOPHEN 5-325 MG PO TABS: 2.0000 | ORAL_TABLET | ORAL | Status: DC | PRN

## 2024-05-14 MED ORDER — LACTATED RINGERS IV SOLN
500.0000 mL | Freq: Once | INTRAVENOUS | Status: AC
  Administered 2024-05-14: 500 mL via INTRAVENOUS

## 2024-05-14 MED ORDER — SODIUM CHLORIDE 0.9 % IR SOLN
Status: DC | PRN
  Administered 2024-05-14: 1

## 2024-05-14 MED ORDER — SODIUM CHLORIDE 0.9 % IV SOLN
5.0000 10*6.[IU] | Freq: Once | INTRAVENOUS | Status: AC
  Administered 2024-05-14: 5 10*6.[IU] via INTRAVENOUS
  Filled 2024-05-14: qty 5

## 2024-05-14 MED ORDER — DIPHENHYDRAMINE HCL 25 MG PO CAPS: 25.0000 mg | ORAL_CAPSULE | Freq: Four times a day (QID) | ORAL | Status: AC | PRN

## 2024-05-14 MED ORDER — SOD CITRATE-CITRIC ACID 500-334 MG/5ML PO SOLN: 30.0000 mL | ORAL | Status: DC | PRN

## 2024-05-14 MED ORDER — STERILE WATER FOR IRRIGATION IR SOLN
Status: DC | PRN
  Administered 2024-05-14: 1

## 2024-05-14 MED ORDER — IBUPROFEN 600 MG PO TABS: 600.0000 mg | ORAL_TABLET | Freq: Four times a day (QID) | ORAL | Status: DC

## 2024-05-14 MED ORDER — ONDANSETRON HCL 4 MG/2ML IJ SOLN
4.0000 mg | Freq: Four times a day (QID) | INTRAMUSCULAR | Status: DC | PRN
  Administered 2024-05-14: 4 mg via INTRAVENOUS
  Filled 2024-05-14: qty 2

## 2024-05-14 MED ORDER — LIDOCAINE HCL (PF) 1 % IJ SOLN
INTRAMUSCULAR | Status: DC | PRN
  Administered 2024-05-14 (×2): 4 mL via EPIDURAL

## 2024-05-14 MED ORDER — LABETALOL HCL 5 MG/ML IV SOLN
INTRAVENOUS | Status: AC
  Administered 2024-05-14: 20 mg via INTRAVENOUS
  Filled 2024-05-14: qty 4

## 2024-05-14 MED ORDER — HYDRALAZINE HCL 20 MG/ML IJ SOLN: 10.0000 mg | INTRAMUSCULAR | Status: AC | PRN

## 2024-05-14 MED ORDER — LACTATED RINGERS IV SOLN: INTRAVENOUS | Status: DC

## 2024-05-14 MED ORDER — OXYTOCIN-SODIUM CHLORIDE 30-0.9 UT/500ML-% IV SOLN
2.5000 [IU]/h | INTRAVENOUS | Status: DC
  Administered 2024-05-14: 41.7 [IU]/h via INTRAVENOUS
  Filled 2024-05-14: qty 500

## 2024-05-14 MED ORDER — ONDANSETRON HCL 4 MG/2ML IJ SOLN: 4.0000 mg | INTRAMUSCULAR | Status: AC | PRN

## 2024-05-14 MED ORDER — LABETALOL HCL 5 MG/ML IV SOLN
20.0000 mg | INTRAVENOUS | Status: AC | PRN
  Administered 2024-05-14: 20 mg via INTRAVENOUS
  Filled 2024-05-14: qty 4

## 2024-05-14 MED ORDER — OXYCODONE-ACETAMINOPHEN 5-325 MG PO TABS: 1.0000 | ORAL_TABLET | ORAL | Status: DC | PRN

## 2024-05-14 MED ORDER — DROPERIDOL 2.5 MG/ML IJ SOLN: 0.6250 mg | Freq: Once | INTRAMUSCULAR | Status: DC | PRN

## 2024-05-14 MED ORDER — ONDANSETRON HCL 4 MG/2ML IJ SOLN
INTRAMUSCULAR | Status: DC | PRN
  Administered 2024-05-14: 4 mg via INTRAVENOUS

## 2024-05-14 MED ORDER — LIDOCAINE HCL (PF) 1 % IJ SOLN: 30.0000 mL | INTRAMUSCULAR | Status: DC | PRN

## 2024-05-14 MED ORDER — ACETAMINOPHEN 325 MG PO TABS: 650.0000 mg | ORAL_TABLET | ORAL | Status: DC | PRN

## 2024-05-14 MED ORDER — LIDOCAINE-EPINEPHRINE (PF) 2 %-1:200000 IJ SOLN
INTRAMUSCULAR | Status: DC | PRN
  Administered 2024-05-14: 7 mL via EPIDURAL
  Administered 2024-05-14: 3 mL via EPIDURAL

## 2024-05-14 MED ORDER — EPHEDRINE SULFATE (PRESSORS) 25 MG/5ML IV SOSY
PREFILLED_SYRINGE | INTRAVENOUS | Status: DC | PRN
  Administered 2024-05-14 (×2): 5 mg via INTRAVENOUS

## 2024-05-14 MED ORDER — WITCH HAZEL-GLYCERIN EX PADS: 1.0000 | MEDICATED_PAD | CUTANEOUS | Status: AC | PRN

## 2024-05-14 MED ORDER — BUPIVACAINE HCL (PF) 0.25 % IJ SOLN
INTRAMUSCULAR | Status: DC | PRN
  Administered 2024-05-14: 20 mL

## 2024-05-14 MED ORDER — TETANUS-DIPHTH-ACELL PERTUSSIS 5-2-15.5 LF-MCG/0.5 IM SUSP: 0.5000 mL | Freq: Once | INTRAMUSCULAR | Status: AC

## 2024-05-14 MED ORDER — OXYTOCIN-SODIUM CHLORIDE 30-0.9 UT/500ML-% IV SOLN
1.0000 m[IU]/min | INTRAVENOUS | Status: DC
  Administered 2024-05-14: 2 m[IU]/min via INTRAVENOUS

## 2024-05-14 MED ORDER — OXYCODONE HCL 5 MG PO TABS: 10.0000 mg | ORAL_TABLET | ORAL | Status: AC | PRN

## 2024-05-14 MED ORDER — BENZOCAINE-MENTHOL 20-0.5 % EX AERO: 1.0000 | INHALATION_SPRAY | CUTANEOUS | Status: AC | PRN

## 2024-05-14 MED ORDER — BUPIVACAINE HCL (PF) 0.25 % IJ SOLN
INTRAMUSCULAR | Status: AC
  Filled 2024-05-14: qty 30

## 2024-05-14 MED ORDER — LACTATED RINGERS IV SOLN: 500.0000 mL | INTRAVENOUS | Status: DC | PRN

## 2024-05-14 MED ORDER — ONDANSETRON HCL 4 MG PO TABS: 4.0000 mg | ORAL_TABLET | ORAL | Status: AC | PRN

## 2024-05-14 MED ORDER — MISOPROSTOL 200 MCG PO TABS
ORAL_TABLET | ORAL | Status: AC
  Administered 2024-05-14: 800 ug via BUCCAL
  Filled 2024-05-14: qty 4

## 2024-05-14 MED ORDER — FENTANYL CITRATE (PF) 100 MCG/2ML IJ SOLN: 25.0000 ug | INTRAMUSCULAR | Status: DC | PRN

## 2024-05-14 MED ORDER — MAGNESIUM SULFATE 40 GM/1000ML IV SOLN
INTRAVENOUS | Status: AC
  Administered 2024-05-14: 4 g via INTRAVENOUS
  Filled 2024-05-14: qty 1000

## 2024-05-14 MED ORDER — FENTANYL-BUPIVACAINE-NACL 0.5-0.125-0.9 MG/250ML-% EP SOLN
12.0000 mL/h | EPIDURAL | Status: DC | PRN
  Administered 2024-05-14: 10 mL/h via EPIDURAL
  Filled 2024-05-14: qty 250

## 2024-05-14 MED ORDER — LABETALOL HCL 200 MG PO TABS
200.0000 mg | ORAL_TABLET | Freq: Two times a day (BID) | ORAL | Status: AC
  Administered 2024-05-14 (×2): 200 mg via ORAL
  Filled 2024-05-14 (×2): qty 1

## 2024-05-14 MED ORDER — MISOPROSTOL 50MCG HALF TABLET: 50.0000 ug | ORAL_TABLET | Freq: Once | ORAL | Status: DC

## 2024-05-14 MED ORDER — COCONUT OIL OIL: 1.0000 | TOPICAL_OIL | Status: AC | PRN

## 2024-05-14 MED ORDER — MIDAZOLAM HCL 2 MG/2ML IJ SOLN
INTRAMUSCULAR | Status: AC
  Filled 2024-05-14: qty 2

## 2024-05-14 MED ORDER — DIPHENHYDRAMINE HCL 50 MG/ML IJ SOLN: 12.5000 mg | INTRAMUSCULAR | Status: DC | PRN

## 2024-05-14 MED ORDER — DIBUCAINE (PERIANAL) 1 % EX OINT: 1.0000 | TOPICAL_OINTMENT | CUTANEOUS | Status: AC | PRN

## 2024-05-14 MED ORDER — PENICILLIN G POT IN DEXTROSE 60000 UNIT/ML IV SOLN
3.0000 10*6.[IU] | INTRAVENOUS | Status: DC
  Administered 2024-05-14 (×2): 3 10*6.[IU] via INTRAVENOUS
  Filled 2024-05-14 (×2): qty 50

## 2024-05-14 NOTE — Progress Notes (Signed)
 Gabrielle Park is a 44 y.o. H3E5985 at [redacted]w[redacted]d.  Subjective: Comfortable with epidural  Objective: BP 136/77   Pulse (!) 51   Temp (!) 97 F (36.1 C) (Axillary)   Resp 16   Ht 4' 11 (1.499 m)   Wt 71.4 kg   LMP 08/07/2023   SpO2 99%   BMI 31.79 kg/m    FHT:  FHR: 125 bpm, variability: Moderate,  accelerations: 15 X15,  decelerations: Repetitive mild variables that resolved with amnioinfusion UC:   Q 2-4 minutes, 170-280 Dilation: 8.5 Effacement (%): 80 Station: 0, +1 Presentation: Vertex Exam by:: A. White, RN  Labs: Results for orders placed or performed during the hospital encounter of 05/14/24 (from the past 24 hours)  CBC     Status: Abnormal   Collection Time: 05/14/24  6:54 AM  Result Value Ref Range   WBC 8.2 4.0 - 10.5 K/uL   RBC 4.69 3.87 - 5.11 MIL/uL   Hemoglobin 12.7 12.0 - 15.0 g/dL   HCT 61.2 63.9 - 53.9 %   MCV 82.5 80.0 - 100.0 fL   MCH 27.1 26.0 - 34.0 pg   MCHC 32.8 30.0 - 36.0 g/dL   RDW 83.5 (H) 88.4 - 84.4 %   Platelets 110 (L) 150 - 400 K/uL   nRBC 0.0 0.0 - 0.2 %  Type and screen     Status: None   Collection Time: 05/14/24  6:54 AM  Result Value Ref Range   ABO/RH(D) O POS    Antibody Screen NEG    Sample Expiration      05/17/2024,2359 Performed at [facility], [address] [place], [place] [address]   RPR     Status: None   Collection Time: 05/14/24  6:54 AM  Result Value Ref Range   RPR Ser Ql NON REACTIVE NON REACTIVE  Comprehensive metabolic panel     Status: Abnormal   Collection Time: 05/14/24  6:54 AM  Result Value Ref Range   Sodium 136 135 - 145 mmol/L   Potassium 4.1 3.5 - 5.1 mmol/L   Chloride 106 98 - 111 mmol/L   CO2 19 (L) 22 - 32 mmol/L   Glucose, Bld 81 70 - 99 mg/dL   BUN 10 6 - 20 mg/dL   Creatinine, Ser 9.27 0.44 - 1.00 mg/dL   Calcium 8.6 (L) 8.9 - 10.3 mg/dL   Total Protein 6.7 6.5 - 8.1 g/dL   Albumin 3.6 3.5 - 5.0 g/dL   AST 33 15 - 41 U/L   ALT 21 0 - 44 U/L   Alkaline Phosphatase 226  (H) 38 - 126 U/L   Total Bilirubin 0.4 0.0 - 1.2 mg/dL   GFR, Estimated >39 >39 mL/min   Anion gap 11 5 - 15  Protein / creatinine ratio, urine     Status: None   Collection Time: 05/14/24  6:56 AM  Result Value Ref Range   Creatinine, Urine 45 mg/dL   Total Protein, Urine 6 mg/dL   Protein Creatinine Ratio 0.1 <0.2 mg/mg  CBC with Differential/Platelet     Status: Abnormal   Collection Time: 05/14/24 12:15 PM  Result Value Ref Range   WBC 8.7 4.0 - 10.5 K/uL   RBC 4.63 3.87 - 5.11 MIL/uL   Hemoglobin 12.6 12.0 - 15.0 g/dL   HCT 61.6 63.9 - 53.9 %   MCV 82.7 80.0 - 100.0 fL   MCH 27.2 26.0 - 34.0 pg   MCHC 32.9 30.0 - 36.0  g/dL   RDW 83.7 (H) 88.4 - 84.4 %   Platelets 97 (L) 150 - 400 K/uL   nRBC 0.0 0.0 - 0.2 %   Neutrophils Relative % 69 %   Neutro Abs 6.0 1.7 - 7.7 K/uL   Lymphocytes Relative 22 %   Lymphs Abs 1.9 0.7 - 4.0 K/uL   Monocytes Relative 5 %   Monocytes Absolute 0.5 0.1 - 1.0 K/uL   Eosinophils Relative 3 %   Eosinophils Absolute 0.3 0.0 - 0.5 K/uL   Basophils Relative 0 %   Basophils Absolute 0.0 0.0 - 0.1 K/uL   Immature Granulocytes 1 %   Abs Immature Granulocytes 0.04 0.00 - 0.07 K/uL    Assessment / Plan: [redacted]w[redacted]d week IUP ROM x Hours: 3 Minutes: 38 Labor: Transition.  Progressing well on 2 milliunits of Pitocin .   Small amount of frank bright red bleeding concerning for small abruption.  Discussed with patient that bleeding is stable currently, but if bleeding became heavy it may become an indication for emergent C-section. Fetal Wellbeing:  Category 1-2, overall reassuring with moderate variability, accelerations and resolution of variable decelerations.  Will continue to observe closely.  Attending updated. Pain Control: Epidural Anticipated MOD: SVD unless heavy bleeding occurs. Chronic hypertension with superimposed preeclampsia with severe features: Continue magnesium  sulfate.  Will check labs every shift.  Blood pressures have improved.  [name],  Gabreal Worton , CNM 05/14/2024 3:21 PM

## 2024-05-14 NOTE — Op Note (Addendum)
 Gabrielle Park 05/14/2024  PREOPERATIVE DIAGNOSES: Undesired fertility  POSTOPERATIVE DIAGNOSES: Undesired fertility  PROCEDURE:  Postpartum Bilateral Salpingectomy  SURGEON: Dr. Kieth Carolin ASSISTANT: Dr. Barkley Angles  ANESTHESIA:  Epidural and local analgesia using 0.5% Bupivicaine  COMPLICATIONS:  None immediate.  ESTIMATED BLOOD LOSS: 5 ml.  INDICATIONS:  44 y.o. [id number] with undesired fertility, status post vaginal delivery, desires permanent sterilization.  Other reversible forms of contraception were discussed with patient; she declines all other modalities. Risks of procedure discussed with patient including but not limited to: risk of regret, permanence of method, bleeding, infection, injury to surrounding organs and need for additional procedures.  Failure risk of 1% with increased risk of ectopic gestation if pregnancy occurs was also discussed with patient.      FINDINGS:  Normal uterus, tubes, and ovaries.  PROCEDURE DETAILS: The patient was taken to the operating room where her epidural anesthesia was dosed up to surgical level and found to be adequate.  She was then placed in the dorsal supine position and prepped and draped in sterile fashion.  After an adequate timeout was performed, attention was turned to the patient's abdomen where a small transverse skin incision was made under the umbilical fold. The incision was taken down to the layer of fascia using blunt dissection. The fascia was elevated, incised, and extended bilaterally using Mayo scissors. The peritoneum was entered bluntly with an S retractor. Attention was then turned to the patient's uterus, the left fallopian tube was grasped with Babcock clamps and followed out to the fimbriated end. A LigaSure was used to seal and transect the mesosalpinx along the length of the tube from the fimbriated end to the cornua.. The pedicle was inspected and found to be hemostatic.  A similar process was carried out on  the right side allowing for bilateral tubal sterilization.  Good hemostasis was noted overall. The instruments were then removed from the patient's abdomen and the fascial incision was repaired with 0 Vicryl in a running fashion. Subcutaneous tissue was reapproximated with simple interrupted 0-vicryl. The skin was closed with a 4-0 Vicryl subcuticular stitch. The patient tolerated the procedure well.  Instrument, sponge, and needle counts were correct times two.  The patient was then taken to the recovery room awake and in stable condition.  Kieth Carolin, MD Obstetrician & Gynecologist, [facility] for [employer], [facility]

## 2024-05-14 NOTE — Progress Notes (Signed)
 Progress Note  43yo 09/5013 PPD0 s/p SVD who requests permanent sterilization.  No prior abdominal surgeries.  Fundus palpable at umbilicus.  Tubal papers signed 03/17/24  Reviewed sterilization is permanent; alternatives include LARC & vasectomy which she declines. Discussed plan for salpingectomy for lower rate of pregnancy failure & potential ovarian cancer risk reduction. Risks of surgery include but are not limited to: bleeding, infection, injury to surrounding organs/tissues (i.e. bowel/bladder/ureters), need for additional procedures, wound complications, hospital re-admission, need for laparotomy, VTE. We reviewed risk of contraceptive failure and risk of regret.   After discussion, she confirms her desire for permanent sterilization and feels certain of her decision. Consents signed. To OR when ready.   Kieth Carolin, MD Obstetrician & Gynecologist, [facility] for [employer], [facility]

## 2024-05-14 NOTE — Discharge Summary (Shared)
 "    Postpartum Discharge Summary  Date of Service updated***     [name] DOB: 1980/10/04 MRN: [id number]  Date of admission: 05/14/2024 Delivery date:05/14/2024 Delivering provider: [name], [place]  Date of discharge: 05/14/2024  Admitting diagnosis: Chronic hypertension affecting pregnancy [O10.919] Intrauterine pregnancy: [redacted]w[redacted]d     Secondary diagnosis:  Active Problems:   Chronic hypertension affecting pregnancy   Vaginal delivery   Preeclampsia, severe   Discharge diagnosis: Term Pregnancy Delivered                                              Post partum procedures: postpartum tubal ligation, {Postpartum procedures:23558} Augmentation: AROM, Pitocin , and Cytotec  Complications: None  Hospital course: Induction of Labor With Vaginal Delivery   44 y.o. yo H3E4984 at [redacted]w[redacted]d was admitted to the hospital 05/14/2024 for induction of labor.  Indication for induction: Preeclampsia with severe features superimposed on cHTN.  Patient had an labor course that was uncomplicated Membrane Rupture Time/Date: 11:43 AM,05/14/2024  Delivery Method:Vaginal, Spontaneous Operative Delivery:N/A Episiotomy: None Lacerations:  1st degree;Perineal Details of delivery can be found in separate delivery note.  Patient had a postpartum course complicated by***. Patient is discharged home 05/14/24.  Newborn Data: Birth date:05/14/2024 Birth time:4:14 PM Gender:Female Living status:Living Apgars:8 ,9  Weight:2790 g  Magnesium  Sulfate received: Yes: Seizure prophylaxis BMZ received: No Rhophylac:N/A MMR:N/A T-DaP:Given prenatally Flu: received prenatally RSV Vaccine received: Yes Transfusion:{Transfusion received:30440034}  Immunizations received: Immunization History  Administered Date(s) Administered    sv, Bivalent, Protein Subunit Rsvpref,pf Marlow) 04/28/2024   Influenza, Seasonal, Injecte, Preservative Fre 01/21/2024   Influenza,inj,Quad PF,6+ Mos 12/18/2015    Influenza-Unspecified 04/23/2023   Tdap 12/18/2015, 03/17/2024    Physical exam  Vitals:   05/14/24 1731 05/14/24 1741 05/14/24 1746 05/14/24 1756  BP: (!) 132/107 (!) 156/127 (!) 163/76 (!) 148/62  Pulse: (!) 101 (!) 110 73 63  Resp:      Temp:      TempSrc:      SpO2:      Weight:      Height:       General: {Exam; general:21111117} Lochia: {Desc; appropriate/inappropriate:30686::appropriate} Uterine Fundus: {Desc; firm/soft:30687} Incision: {Exam; incision:21111123} DVT Evaluation: {Exam; dvt:2111122} Labs: Lab Results  Component Value Date   WBC 8.7 05/14/2024   HGB 12.6 05/14/2024   HCT 38.3 05/14/2024   MCV 82.7 05/14/2024   PLT 97 (L) 05/14/2024      Latest Ref Rng & Units 05/14/2024   12:15 PM  CMP  Glucose 70 - 99 mg/dL 97   BUN 6 - 20 mg/dL 9   Creatinine 9.55 - 8.99 mg/dL 9.22   Sodium 864 - 854 mmol/L 133   Potassium 3.5 - 5.1 mmol/L 4.5   Chloride 98 - 111 mmol/L 104   CO2 22 - 32 mmol/L 18   Calcium 8.9 - 10.3 mg/dL 8.2   Total Protein 6.5 - 8.1 g/dL 6.5   Total Bilirubin 0.0 - 1.2 mg/dL 0.3   Alkaline Phos 38 - 126 U/L 227   AST 15 - 41 U/L 38   ALT 0 - 44 U/L 22    Edinburgh Score:     No data to display         No data recorded  After visit meds:  Allergies as of 05/14/2024   No Known Allergies   Med Rec must be  completed prior to using this [facility]***        Discharge home in stable condition Infant Feeding: {Baby feeding:23562} Infant Disposition:{CHL IP OB HOME WITH FNUYZM:76418} Discharge instruction: per After Visit Summary and Postpartum booklet. Activity: Advance as tolerated. Pelvic rest for 6 weeks.  Diet: {OB ipzu:78888878} Future Appointments: Future Appointments  Date Time Provider Department Center  05/25/2024  8:30 AM Hedges, Reyes, PA-C [facility] None  05/26/2024  9:00 AM CENTERING PROVIDER [facility]   Follow up Visit:   Please schedule this patient for a In person postpartum visit in 6 weeks with the  following provider: Any provider. Additional Postpartum F/U:BP check 1 week  High risk pregnancy complicated by: HTN Delivery mode:  Vaginal, Spontaneous Anticipated Birth Control:  BTL done PP  Msg sent 2/6  05/14/2024 [name] Alena Morrison, MD    "

## 2024-05-14 NOTE — Transfer of Care (Signed)
 Immediate Anesthesia Transfer of Care Note  Patient: Gabrielle Park  Procedure(s) Performed: LIGATION, FALLOPIAN TUBE, POSTPARTUM (Bilateral: Abdomen)  Patient Location: PACU  Anesthesia Type:Epidural  Level of Consciousness: awake, alert , and oriented  Airway & Oxygen Therapy: Patient Spontanous Breathing  Post-op Assessment: Report given to RN and Post -op Vital signs reviewed and stable  Post vital signs: Reviewed and stable  Last Vitals:  Vitals Value Taken Time  BP 113/61 05/14/24 19:30  Temp    Pulse 60 05/14/24 19:39  Resp 16 05/14/24 19:39  SpO2 96 % 05/14/24 19:39  Vitals shown include unfiled device data.  Last Pain:  Vitals:   05/14/24 1745  TempSrc:   PainSc: 0-No pain         Complications: No notable events documented.

## 2024-05-14 NOTE — Progress Notes (Signed)
 Interpretor used for PACU phase of care

## 2024-05-14 NOTE — Anesthesia Preprocedure Evaluation (Addendum)
"                                    Anesthesia Evaluation  Patient identified by MRN, date of birth, ID band Patient awake    Reviewed: Allergy & Precautions, Patient's Chart, lab work & pertinent test results  History of Anesthesia Complications Negative for: history of anesthetic complications  Airway Mallampati: II  TM Distance: >3 FB Neck ROM: Full    Dental no notable dental hx.    Pulmonary asthma    Pulmonary exam normal        Cardiovascular hypertension, Normal cardiovascular exam     Neuro/Psych negative neurological ROS     GI/Hepatic negative GI ROS, Neg liver ROS,,,  Endo/Other  negative endocrine ROS    Renal/GU negative Renal ROS     Musculoskeletal negative musculoskeletal ROS (+)    Abdominal   Peds  Hematology Plt 110k   Anesthesia Other Findings   Reproductive/Obstetrics (+) Pregnancy (severe preE on Mg)                              Anesthesia Physical Anesthesia Plan  ASA: 3  Anesthesia Plan: Epidural   Post-op Pain Management:    Induction:   PONV Risk Score and Plan: Treatment may vary due to age or medical condition  Airway Management Planned: Natural Airway  Additional Equipment: Fetal Monitoring  Intra-op Plan:   Post-operative Plan:   Informed Consent: I have reviewed the patients History and Physical, chart, labs and discussed the procedure including the risks, benefits and alternatives for the proposed anesthesia with the patient or authorized representative who has indicated his/her understanding and acceptance.     Interpreter used for interview  Plan Discussed with:   Anesthesia Plan Comments:          Anesthesia Quick Evaluation  "

## 2024-05-14 NOTE — H&P (Cosign Needed)
 OBSTETRIC ADMISSION HISTORY AND PHYSICAL  Gabrielle Park is a 44 y.o. female [phone] with IUP at [redacted]w[redacted]d by 7wk US  presenting for IOL for cHTN. She reports +FMs, No LOF, no VB, no blurry vision, headaches or peripheral edema, and RUQ pain.  She plans on bottle feeding. She request BTL for birth control. She received her prenatal care at [facility]   Dating: By priscilla US  --->  Estimated Date of Delivery: 05/21/24  Sono:    @[redacted]w[redacted]d , CWD, normal anatomy, cephalic presentation, 2204g, 87% EFW   Prenatal History/Complications: Gestational thrombocytopenia, AMA, cHTN on ASA and labetalol , hx of IUGR in previous pregnancy, hx of pre-eclampsia in previous pregnancy  Past Medical History: Past Medical History:  Diagnosis Date   Abnormal cervical Papanicolaou smear    Anemia    Asthma    History of severe pre-eclampsia 03/05/2016   Hypertension     Past Surgical History: Past Surgical History:  Procedure Laterality Date   DILATION AND EVACUATION N/A 06/05/2023   Procedure: DILATATION AND EVACUATION;  Surgeon: Herchel Gloris LABOR, MD;  Location: [facility] OR;  Service: Gynecology;  Laterality: N/A;   OPERATIVE ULTRASOUND N/A 06/05/2023   Procedure: OPERATIVE ULTRASOUND;  Surgeon: Herchel Gloris LABOR, MD;  Location: [facility] OR;  Service: Gynecology;  Laterality: N/A;    Obstetrical History: OB History     Gravida  6   Para  4   Term  4   Preterm  0   AB  1   Living  4      SAB  1   IAB  0   Ectopic  0   Multiple  0   Live Births  4           Social History Social History   Socioeconomic History   Marital status: Single    Spouse name: Not on file   Number of children: Not on file   Years of education: Not on file   Highest education level: Not on file  Occupational History   Not on file  Tobacco Use   Smoking status: Never   Smokeless tobacco: Never  Vaping Use   Vaping status: Never Used  Substance and Sexual Activity   Alcohol use: Not Currently    Comment: socially    Drug use: No   Sexual activity: Yes    Birth control/protection: None  Other Topics Concern   Not on file  Social History Narrative   Not on file   Social Drivers of Health   Tobacco Use: Low Risk (05/07/2024)   Patient History    Smoking Tobacco Use: Never    Smokeless Tobacco Use: Never    Passive Exposure: Not on file  Financial Resource Strain: Not on file  Food Insecurity: No Food Insecurity (11/27/2023)   Epic    Worried About Programme Researcher, Broadcasting/film/video in the Last Year: Never true    Ran Out of Food in the Last Year: Never true  Transportation Needs: No Transportation Needs (11/27/2023)   Epic    Lack of Transportation (Medical): No    Lack of Transportation (Non-Medical): No  Physical Activity: Not on file  Stress: Not on file  Social Connections: Unknown (08/21/2021)   Received from [facility]   Social Network    Social Network: Not on file  Depression (PHQ2-9): Low Risk (03/17/2024)   Depression (PHQ2-9)    PHQ-2 Score: 0  Alcohol Screen: Not on file  Housing: Not on file  Utilities: Not on file  Health Literacy: Not on file    Family History: Family History  Problem Relation Age of Onset   Hypertension Mother    Diabetes Mother    Healthy Father    Diabetes Maternal Aunt     Allergies: Allergies[1]  Medications Prior to Admission  Medication Sig Dispense Refill Last Dose/Taking   acetaminophen -caffeine  (EXCEDRIN TENSION HEADACHE) 500-65 MG TABS per tablet Take 2 tablets by mouth 4 (four) times daily as needed (For headaches). 60 tablet 0    aspirin  EC 81 MG tablet Take 2 tablets (162 mg total) by mouth daily. Swallow whole. 60 tablet 12    FEROSUL 325 (65 Fe) MG tablet Take 325 mg by mouth 2 (two) times daily.      labetalol  (NORMODYNE ) 200 MG tablet Take 1 tablet (200 mg total) by mouth 2 (two) times daily. 60 tablet 3    prenatal vitamin w/FE, FA (PRENATAL 1 + 1) 27-1 MG TABS tablet Take 1 tablet by mouth daily at 12 noon. 30 tablet 6      Review  of Systems   All systems reviewed and negative except as stated in HPI  Last menstrual period 08/07/2023. General appearance: alert, cooperative, appears stated age, and no distress Lungs: clear to auscultation bilaterally Heart: regular rate and rhythm Abdomen: soft, non-tender; bowel sounds normal Extremities: Homans sign is negative, no sign of DVT Presentation: cephalic Fetal monitoringBaseline: 130 bpm, Variability: Good {> 6 bpm), Accelerations: Reactive, and Decelerations: Absent Uterine activityFrequency: Every 7-8 minutes     Prenatal labs: ABO, Rh: --/--/O POS Performed at [facility], [address], [place], [place] [address]  [phone]/11 [phone]) Antibody: Negative (07/24 0000) Rubella: Immune (07/24 0000) RPR: Non Reactive (11/25 0827)  HBsAg: non-reactive (07/24 0000)  HIV: Non Reactive (11/25 0827)  GBS: Positive/-- (01/21 1403)    Lab Results  Component Value Date   GBS Positive (A) 04/28/2024   GTT elevated early 1 hr, normal 3 hour Genetic screening  low-risk Anatomy US  normal  Immunization History  Administered Date(s) Administered    sv, Bivalent, Protein Subunit Rsvpref,pf Marlow) 04/28/2024   Influenza, Seasonal, Injecte, Preservative Fre 01/21/2024   Influenza,inj,Quad PF,6+ Mos 12/18/2015   Influenza-Unspecified 04/23/2023   Tdap 12/18/2015, 03/17/2024    Prenatal Transfer Tool  Maternal Diabetes: No Genetic Screening: Normal Maternal Ultrasounds/Referrals: IUGR (resolved) Fetal Ultrasounds or other Referrals:  Referred to Materal Fetal Medicine  Maternal Substance Abuse:  No Significant Maternal Medications:  None Significant Maternal Lab Results: Group B Strep positive Number of Prenatal Visits:greater than 3 verified prenatal visits Maternal Vaccinations:RSV: Given during pregnancy >/=14 days ago, TDap, and Flu Other Comments:  None   No results found for this or any previous visit (from the past 24 hours).  Patient Active Problem  List   Diagnosis Date Noted   Gestational thrombocytopenia without hemorrhage, third trimester 05/06/2024   Hx of intrauterine growth restriction in prior pregnancy, currently pregnant 11/27/2023   Hx of preeclampsia, prior pregnancy, currently pregnant 11/27/2023   Advanced maternal age in multigravida 11/27/2023   Chronic hypertension affecting pregnancy 06/12/2023   Language barrier 02/01/2016   Supervision of high risk pregnancy, antepartum 09/21/2015    Assessment/Plan:  Bryonna Kennisha Qin is a 44 y.o. H3E5985 at [redacted]w[redacted]d here for IOL for cHTN  #Labor: IOL, will start with 25mcg vaginal cytotec  #Pain: Per patient preference #FWB: Category I #GBS status:  Positive, PCN for ppx #Feeding: Formula #Reproductive Life planning: Tubal Ligation, paperwork signed 03/17/25 #Circ:  no  #  cHTN: On labetalol  200mg  BID, will collect PEC labs  #Hx IUGR: gUS at 35w 2204g 12%, AC 17%  Charlie DELENA Courts, MD  05/14/2024, 6:24 AM       [1] No Known Allergies

## 2024-05-14 NOTE — Progress Notes (Signed)
 Gabrielle Park is a 44 y.o. H3E5985 at [redacted]w[redacted]d.  Subjective: Comfortable w/ epidural  Objective: BP (!) 163/86   Pulse (!) 52   Temp 98.6 F (37 C) (Oral)   Resp 16   Ht 4' 11 (1.499 m)   Wt 71.4 kg   LMP 08/07/2023   SpO2 99%   BMI 31.79 kg/m    FHT:  FHR: 140 bpm, variability: mod,  accelerations:  15x15,  decelerations:  none UC:   Q 2-7 minutes, mod Dilation: 3 Effacement (%): 60 Station: -2 Presentation: Vertex Exam by:: Briena Swingler, CNM AROM small amount of fluid. UTA color  Labs: Results for orders placed or performed during the hospital encounter of 05/14/24 (from the past 24 hours)  CBC     Status: Abnormal   Collection Time: 05/14/24  6:54 AM  Result Value Ref Range   WBC 8.2 4.0 - 10.5 K/uL   RBC 4.69 3.87 - 5.11 MIL/uL   Hemoglobin 12.7 12.0 - 15.0 g/dL   HCT 61.2 63.9 - 53.9 %   MCV 82.5 80.0 - 100.0 fL   MCH 27.1 26.0 - 34.0 pg   MCHC 32.8 30.0 - 36.0 g/dL   RDW 83.5 (H) 88.4 - 84.4 %   Platelets 110 (L) 150 - 400 K/uL   nRBC 0.0 0.0 - 0.2 %  Type and screen     Status: None   Collection Time: 05/14/24  6:54 AM  Result Value Ref Range   ABO/RH(D) O POS    Antibody Screen NEG    Sample Expiration      05/17/2024,2359 Performed at [facility], [address], [place], [place] [address]   RPR     Status: None   Collection Time: 05/14/24  6:54 AM  Result Value Ref Range   RPR Ser Ql NON REACTIVE NON REACTIVE  Comprehensive metabolic panel     Status: Abnormal   Collection Time: 05/14/24  6:54 AM  Result Value Ref Range   Sodium 136 135 - 145 mmol/L   Potassium 4.1 3.5 - 5.1 mmol/L   Chloride 106 98 - 111 mmol/L   CO2 19 (L) 22 - 32 mmol/L   Glucose, Bld 81 70 - 99 mg/dL   BUN 10 6 - 20 mg/dL   Creatinine, Ser 9.27 0.44 - 1.00 mg/dL   Calcium 8.6 (L) 8.9 - 10.3 mg/dL   Total Protein 6.7 6.5 - 8.1 g/dL   Albumin 3.6 3.5 - 5.0 g/dL   AST 33 15 - 41 U/L   ALT 21 0 - 44 U/L   Alkaline Phosphatase 226 (H) 38 - 126 U/L   Total Bilirubin  0.4 0.0 - 1.2 mg/dL   GFR, Estimated >39 >39 mL/min   Anion gap 11 5 - 15  Protein / creatinine ratio, urine     Status: None   Collection Time: 05/14/24  6:56 AM  Result Value Ref Range   Creatinine, Urine 45 mg/dL   Total Protein, Urine 6 mg/dL   Protein Creatinine Ratio 0.1 <0.2 mg/mg  CBC with Differential/Platelet     Status: Abnormal   Collection Time: 05/14/24 12:15 PM  Result Value Ref Range   WBC 8.7 4.0 - 10.5 K/uL   RBC 4.63 3.87 - 5.11 MIL/uL   Hemoglobin 12.6 12.0 - 15.0 g/dL   HCT 61.6 63.9 - 53.9 %   MCV 82.7 80.0 - 100.0 fL   MCH 27.2 26.0 - 34.0 pg   MCHC 32.9 30.0 - 36.0  g/dL   RDW 83.7 (H) 88.4 - 84.4 %   Platelets 97 (L) 150 - 400 K/uL   nRBC 0.0 0.0 - 0.2 %   Neutrophils Relative % 69 %   Neutro Abs 6.0 1.7 - 7.7 K/uL   Lymphocytes Relative 22 %   Lymphs Abs 1.9 0.7 - 4.0 K/uL   Monocytes Relative 5 %   Monocytes Absolute 0.5 0.1 - 1.0 K/uL   Eosinophils Relative 3 %   Eosinophils Absolute 0.3 0.0 - 0.5 K/uL   Basophils Relative 0 %   Basophils Absolute 0.0 0.0 - 0.1 K/uL   Immature Granulocytes 1 %   Abs Immature Granulocytes 0.04 0.00 - 0.07 K/uL    Assessment / Plan: [redacted]w[redacted]d week IUP ROM x Hours: 1 Minutes: 7 Labor: IOL/Early progressing well. Now on pitocin  Fetal Wellbeing:  Category I Pain Control:  Epidural Anticipated MOD:  SVD CHNT w/ superimposed severe Pre-E. On Mag sulfate fore seizure prophylaxis. Having severe BPs again evan after new epidural. Will given second dose of IV Labetalol  20. Has PO Labetalol  scheduled 200 BID. Consider increasing is multiple IV doses needed.  Length discussion w/ interpreter at [facility] about Pre-E, Mag Sulfate, low Plt, severely elevated BPs. Will check labs Q6 hours. Attending updated.   [name], [name] , CNM 05/14/2024 12:50 PM

## 2024-05-14 NOTE — Progress Notes (Signed)
 Patient ID: Gabrielle Park Park, female   DOB: Jul 29, 1980, 44 y.o.   MRN: [id number] RN notified CNM of continued severe range blood pressures after her scheduled morning p.o. labetalol .  Platelets dropped to 110. Will Dx w/ Pre-E w/ severe features. CNM ordered magnesium  sulfate and preeclampsia focus order set.  Will repeat labs at 1500.  Attending updated.  [name]  [name], [name] 05/14/2024 9:01 AM

## 2024-05-14 NOTE — Anesthesia Preprocedure Evaluation (Signed)
"                                    Anesthesia Evaluation  Patient identified by MRN, date of birth, ID band Patient awake    Reviewed: Allergy & Precautions, NPO status , Patient's Chart, lab work & pertinent test results, reviewed documented beta blocker date and time   History of Anesthesia Complications Negative for: history of anesthetic complications  Airway Mallampati: II  TM Distance: >3 FB Neck ROM: Full    Dental no notable dental hx.    Pulmonary neg pulmonary ROS   Pulmonary exam normal        Cardiovascular hypertension, Pt. on medications and Pt. on home beta blockers Normal cardiovascular exam     Neuro/Psych negative neurological ROS     GI/Hepatic negative GI ROS, Neg liver ROS,,,  Endo/Other  negative endocrine ROS    Renal/GU negative Renal ROS     Musculoskeletal negative musculoskeletal ROS (+)    Abdominal   Peds  Hematology Plts 97k   Anesthesia Other Findings   Reproductive/Obstetrics PPD #0 s/p SVD, severe preE on Mg                              Anesthesia Physical Anesthesia Plan  ASA: 3  Anesthesia Plan: Epidural   Post-op Pain Management: Ofirmev  IV (intra-op)*   Induction:   PONV Risk Score and Plan: 2 and Treatment may vary due to age or medical condition  Airway Management Planned: Natural Airway  Additional Equipment: None  Intra-op Plan:   Post-operative Plan:   Informed Consent: I have reviewed the patients History and Physical, chart, labs and discussed the procedure including the risks, benefits and alternatives for the proposed anesthesia with the patient or authorized representative who has indicated his/her understanding and acceptance.       Plan Discussed with: CRNA  Anesthesia Plan Comments:         Anesthesia Quick Evaluation  "

## 2024-05-14 NOTE — Anesthesia Procedure Notes (Signed)
 Epidural Patient location during procedure: OB Start time: 05/14/2024 12:25 PM End time: 05/14/2024 12:28 PM  Staffing Anesthesiologist: Paul Lamarr BRAVO, MD Performed: anesthesiologist   Preanesthetic Checklist Completed: patient identified, IV checked, risks and benefits discussed, monitors and equipment checked, pre-op evaluation and timeout performed  Epidural Patient position: sitting Prep: DuraPrep and site prepped and draped Patient monitoring: continuous pulse ox, blood pressure and heart rate Approach: midline Location: L3-L4 Injection technique: LOR air  Needle:  Needle insertion depth (cm): 5.5 Needle type: Tuohy  Needle gauge: 17 G Needle length: 9 cm Needle insertion depth: 5.5 cm Catheter type: closed end flexible Catheter size: 19 Gauge Catheter at skin depth: 10 cm Test dose: negative and Other (1% lidocaine )  Assessment Events: blood not aspirated, no cerebrospinal fluid, injection not painful, no injection resistance, no paresthesia and negative IV test  Additional Notes Patient identified. Risks, benefits, and alternatives discussed with patient including but not limited to bleeding, infection, nerve damage, paralysis, failed block, incomplete pain control, headache, blood pressure changes, nausea, vomiting, reactions to medication, itching, and postpartum back pain. Confirmed with bedside nurse the patient's most recent platelet count. Confirmed with patient that they are not currently taking any anticoagulation, have any bleeding history, or any family history of bleeding disorders. Patient expressed understanding and wished to proceed. All questions were answered. Sterile technique was used throughout the entire procedure. Please see nursing notes for vital signs.   Crisp LOR on first pass. Test dose was given through epidural catheter and negative prior to continuing to dose epidural or start infusion. Warning signs of high block given to the patient including  shortness of breath, tingling/numbness in hands, complete motor block, or any concerning symptoms with instructions to call for help. Patient was given instructions on fall risk and not to get out of bed. All questions and concerns addressed with instructions to call with any issues or inadequate analgesia.  Reason for block:procedure for pain

## 2024-05-25 ENCOUNTER — Institutional Professional Consult (permissible substitution) (INDEPENDENT_AMBULATORY_CARE_PROVIDER_SITE_OTHER): Admitting: Physician Assistant
